# Patient Record
Sex: Female | Born: 1940
Health system: Southern US, Community
[De-identification: ages and names within clinical notes are randomized; demographics above are authoritative.]

## PROBLEM LIST (undated history)

## (undated) DIAGNOSIS — I509 Heart failure, unspecified: Secondary | ICD-10-CM

## (undated) DIAGNOSIS — I10 Essential (primary) hypertension: Secondary | ICD-10-CM

## (undated) DIAGNOSIS — N189 Chronic kidney disease, unspecified: Secondary | ICD-10-CM

## (undated) DIAGNOSIS — E119 Type 2 diabetes mellitus without complications: Secondary | ICD-10-CM

## (undated) DIAGNOSIS — I442 Atrioventricular block, complete: Secondary | ICD-10-CM

## (undated) DIAGNOSIS — E785 Hyperlipidemia, unspecified: Secondary | ICD-10-CM

## (undated) DIAGNOSIS — I2510 Atherosclerotic heart disease of native coronary artery without angina pectoris: Secondary | ICD-10-CM

## (undated) HISTORY — PX: VAGINAL HYSTERECTOMY: SUR661

## (undated) HISTORY — DX: Atrioventricular block, complete: I44.2

## (undated) HISTORY — DX: Atherosclerotic heart disease of native coronary artery without angina pectoris: I25.10

## (undated) HISTORY — PX: THYROID SURGERY: SHX805

## (undated) HISTORY — PX: HEMORRHOID SURGERY: SHX153

## (undated) HISTORY — DX: Essential (primary) hypertension: I10

## (undated) HISTORY — DX: Type 2 diabetes mellitus without complications: E11.9

## (undated) HISTORY — DX: Heart failure, unspecified: I50.9

## (undated) HISTORY — DX: Chronic kidney disease, unspecified: N18.9

## (undated) HISTORY — PX: BREAST EXCISIONAL BIOPSY: SUR124

## (undated) HISTORY — PX: MENISCUS REPAIR: SHX5179

## (undated) HISTORY — DX: Hyperlipidemia, unspecified: E78.5

---

## 1998-11-21 ENCOUNTER — Encounter: Admission: RE | Admit: 1998-11-21 | Discharge: 1998-11-21 | Payer: Self-pay | Admitting: *Deleted

## 1999-08-18 ENCOUNTER — Other Ambulatory Visit: Admission: RE | Admit: 1999-08-18 | Discharge: 1999-08-18 | Payer: Self-pay | Admitting: Obstetrics and Gynecology

## 1999-08-18 ENCOUNTER — Encounter: Admission: RE | Admit: 1999-08-18 | Discharge: 1999-08-18 | Payer: Self-pay | Admitting: Obstetrics and Gynecology

## 1999-08-18 ENCOUNTER — Encounter: Payer: Self-pay | Admitting: Obstetrics and Gynecology

## 2001-04-29 ENCOUNTER — Encounter: Payer: Self-pay | Admitting: Obstetrics and Gynecology

## 2001-04-29 ENCOUNTER — Encounter: Admission: RE | Admit: 2001-04-29 | Discharge: 2001-04-29 | Payer: Self-pay | Admitting: Obstetrics and Gynecology

## 2004-01-17 ENCOUNTER — Encounter: Admission: RE | Admit: 2004-01-17 | Discharge: 2004-01-17 | Payer: Self-pay | Admitting: Internal Medicine

## 2004-02-11 ENCOUNTER — Encounter (INDEPENDENT_AMBULATORY_CARE_PROVIDER_SITE_OTHER): Payer: Self-pay | Admitting: *Deleted

## 2004-02-11 ENCOUNTER — Ambulatory Visit (HOSPITAL_COMMUNITY): Admission: RE | Admit: 2004-02-11 | Discharge: 2004-02-11 | Payer: Self-pay | Admitting: Gastroenterology

## 2007-06-16 ENCOUNTER — Encounter: Admission: RE | Admit: 2007-06-16 | Discharge: 2007-06-16 | Payer: Self-pay | Admitting: Internal Medicine

## 2010-05-26 NOTE — Op Note (Signed)
NAMESHAKEMIA, Connie Swanson                   ACCOUNT NO.:  [id number]   MEDICAL RECORD NO.:  [id number]          PATIENT TYPE:  AMB   LOCATION:  ENDO                         FACILITY:  [facility]   PHYSICIAN:  Jeryl Columbia, M.D.    DATE OF BIRTH:  09-Apr-1940   DATE OF PROCEDURE:  02/11/2004  DATE OF DISCHARGE:                                 OPERATIVE REPORT   PROCEDURE:  Colonoscopy and polypectomy.   INDICATION:  Screening.   Consent was signed after risks, benefits, methods, and options thoroughly  discussed in the office.   MEDICINES USED:  Demerol 50, Versed 6.   PROCEDURE:  Rectal inspection is pertinent for external hemorrhoids.  Small  digital exam is negative.  Video pediatric adjustable colonoscope was  inserted and fairly easily advanced around the colon to the cecum.  This did  not require any abdominal pressure or any position changes.  Other than some  mild melanosis and some left-sided diverticula, no abnormalities were seen  on insertion.  The cecum was identified by the appendiceal orifice and  ileocecal valve.  The scope was slowly withdrawn.  The prep was adequate.  There was some liquid stool that required washing and suctioning.  On slow  withdrawal through the colon, in the ascending colon, a questionable tiny  polyp was seen and was cold biopsied x 2 and put in the first container.  Scope was slowly withdrawn.  In the transverse and descending, there were  three tiny polyps.  We went ahead and hot biopsied them and put them in a  second container.  In the sigmoid was a couple of hyperplastic-appearing  polyps and they were cold biopsied.  Left-sided occasional diverticula were  confirmed.  No other abnormalities were seen as we slowly withdrew back to  the rectum.  Anorectal pull-through and retroflexion confirmed some small  hemorrhoids.  Scope was drain, air was suctioned, and the scope removed.  The patient tolerated the procedure well.  There was no obvious  immediate  complication.   ENDOSCOPIC DIAGNOSES:  1.  Internal and external small hemorrhoids.  2.  Left-sided occasional diverticula.  3.  Mild melanosis.  4.  A few tiny to small polyps in the sigmoid, descending, transverse, and      ascending, some cold in the sigmoid and ascending, some hot in the      descending and transverse.  5.  Otherwise within normal limits in the cecum.   PLAN:  Await pathology.  Would probably recheck colon screening in five  years.  Happy to see back p.r.n.  Otherwise, return care to Dr. __________  for the customary health maintenance including yearly rectals and guaiacs.      MEM/MEDQ  D:  02/11/2004  T:  02/11/2004  Job:  [id number]   cc:   Haywood Pao, M.D.  [address]  [place]  [place] [address]  Fax: [phone]

## 2010-10-10 ENCOUNTER — Ambulatory Visit (HOSPITAL_COMMUNITY)
Admission: RE | Admit: 2010-10-10 | Discharge: 2010-10-10 | Disposition: A | Discharge status: Home / Self Care | Payer: Medicare Other | Source: Ambulatory Visit | Attending: Cardiology | Admitting: Cardiology

## 2010-10-10 DIAGNOSIS — I2510 Atherosclerotic heart disease of native coronary artery without angina pectoris: Secondary | ICD-10-CM | POA: Insufficient documentation

## 2010-10-10 DIAGNOSIS — R0609 Other forms of dyspnea: Secondary | ICD-10-CM | POA: Insufficient documentation

## 2010-10-10 DIAGNOSIS — I10 Essential (primary) hypertension: Secondary | ICD-10-CM | POA: Insufficient documentation

## 2010-10-10 DIAGNOSIS — E119 Type 2 diabetes mellitus without complications: Secondary | ICD-10-CM | POA: Insufficient documentation

## 2010-10-10 DIAGNOSIS — R0602 Shortness of breath: Secondary | ICD-10-CM | POA: Insufficient documentation

## 2010-10-10 DIAGNOSIS — R0989 Other specified symptoms and signs involving the circulatory and respiratory systems: Secondary | ICD-10-CM | POA: Insufficient documentation

## 2010-10-10 LAB — POCT I-STAT 3, VENOUS BLOOD GAS (G3P V)
Acid-Base Excess: 8 mmol/L — ABNORMAL HIGH (ref 0.0–2.0)
Bicarbonate: 33.8 mEq/L — ABNORMAL HIGH (ref 20.0–24.0)
O2 Saturation: 75 %
TCO2: 35 mmol/L (ref 0–100)
pCO2, Ven: 51 mmHg — ABNORMAL HIGH (ref 45.0–50.0)
pH, Ven: 7.429 — ABNORMAL HIGH (ref 7.250–7.300)
pO2, Ven: 41 mmHg (ref 30.0–45.0)

## 2010-10-10 LAB — GLUCOSE, CAPILLARY
Glucose-Capillary: 119 mg/dL — ABNORMAL HIGH (ref 70–99)
Glucose-Capillary: 140 mg/dL — ABNORMAL HIGH (ref 70–99)

## 2010-10-10 LAB — POCT I-STAT 3, ART BLOOD GAS (G3+)
pCO2 arterial: 50.2 mmHg — ABNORMAL HIGH (ref 35.0–45.0)
pH, Arterial: 7.407 — ABNORMAL HIGH (ref 7.350–7.400)

## 2010-10-16 NOTE — Cardiovascular Report (Signed)
Connie Swanson, Connie Swanson  MEDICAL RECORD NO.:  [id number]  LOCATION:  [facility]                         FACILITY:  [facility]  PHYSICIAN:  Laverda Page, MD DATE OF BIRTH:  1940/06/27  DATE OF PROCEDURE:  10/10/2010 DATE OF DISCHARGE:  10/10/2010                           CARDIAC CATHETERIZATION   REFERRING PHYSICIAN:  Haywood Pao, MD  PROCEDURES PERFORMED: 1. Right heart catheterization. 2. Left heart catheterization including:     a.     Left ventriculography.     b.     Selective right and left coronary arteriography.  INDICATIONS:  Ms. Connie Swanson is a 70 year old female with a history of hypertension, diabetes, who had been complaining of shortness of breath and dyspnea on exertion.  She also has abnormal EKG in the form of left bundle-branch block.  Echocardiogram had revealed ejection fraction of 35% with a possible mid-to-distal anterior hypokinesis.  The Lexiscan stress test had revealed inferior and inferoseptal wall scar without ischemia with ejection fraction of 45% considered to be a low-risk. However, because of shortness of breath felt to be due to congestive heart failure, she is now brought to the Cardiac Cath Lab to evaluate her coronary anatomy.  Right heart catheterization is being performed to evaluate for pulmonary hypertension, for dyspnea.  HEMODYNAMIC DATA:  Right heart catheterization:  RA pressure 7/7, mean 6 mmHg.  RA saturation 76%.  RV pressure 123456, end-diastolic pressure 4 mmHg.  PA pressure 27/40 with a mean of 21 mmHg.  PA saturation was 75%.  Pulmonary capillary wedge 6/5 with a mean of 3 mmHg.  Aortic saturation was 100%.  Cardiac output was 6.4 with a cardiac index of 3.34 by Fick.  Right coronary artery:  Right coronary artery is a large caliber and superdominant vessel.  Gives origin to large PL branch and 2 PDA large branches.  Smooth and normal.  Left main coronary artery:  Left main coronary  artery is a large-caliber vessel, which is smooth and normal.  Circumflex coronary artery:  Circumflex coronary artery is a very large- caliber vessel.  Gives origin to a moderate-sized obtuse marginal 1 and continues distally as a large obtuse marginal 2.  Smooth and normal.  LAD:  LAD is a large-caliber vessel in the proximal segment.  Gives origin to a moderate-sized diagonal 1 and then gives origin to a very large diagonal 2, which is bigger than the LAD itself.  Between diagonal 1 and diagonal 2, there is a eccentric 50% stenosis noted in the LAD. The diagonal 2, which is very large has a ostial 30% stenosis. Otherwise, except for this focal stenosis of 50%, there is no other lesion noted in the LAD or the large diagonal 2.  IMPRESSION: 1. Single-vessel coronary artery disease involving the mid left     anterior descending with an eccentric 50% stenosis.  Otherwise,     smooth and normal coronary arteries. 2. Left ventricular systolic function was normal.  Left     ventriculography performed both in left anterior oblique and right     anterior oblique projections revealed ejection fraction of 50% with     very  mild septal hypokinesis probably related to left bundle-branch     block.  There was no significant mitral regurgitation.  RECOMMENDATIONS:  Based on the coronary anatomy, continued aggressive risk modification is indicated.  A total of 75 mL of contrast was utilized for diagnostic angiography.  TECHNIQUE OF PROCEDURE:  Under sterile precautions, using a 5-French right antecubital vein access and a 6-French right radial access, a 5- French balloon-tipped catheter was advanced through the venous sheath into the pulmonary catheter wedge position without any complication. Right heart pressures waveforms were analyzed and catheter then pulled out of body after obtaining saturations.  Left heart catheterization was performed using a 6-French right radial access.  A  6-French multipurpose B2 catheter was advanced into ascending aorta and then into the left ventricle.  Left ventriculography was performed both in LAO and RAO projections.  Catheter pulled into the ascending aorta.  Right coronary selectively engaged and angiography was performed.  Then the left main coronary selectively engaged and angiography was performed, then catheter was then pulled out of body.  Hemostasis were obtained by applying TR band at the arterial access site and manual pressure was held at the venous site without any complications.     Laverda Page, MD     JRG/MEDQ  D:  10/10/2010  T:  10/10/2010  Job:  [id number]  cc:   Haywood Pao, M.D.  Electronically Signed by Adrian Prows MD on 10/16/2010 08:43:11 AM

## 2011-03-12 ENCOUNTER — Other Ambulatory Visit: Payer: Self-pay | Admitting: Internal Medicine

## 2011-03-12 DIAGNOSIS — Z1231 Encounter for screening mammogram for malignant neoplasm of breast: Secondary | ICD-10-CM

## 2011-03-22 ENCOUNTER — Ambulatory Visit
Admission: RE | Admit: 2011-03-22 | Discharge: 2011-03-22 | Disposition: A | Discharge status: Home / Self Care | Payer: Medicare Other | Source: Ambulatory Visit | Attending: Internal Medicine | Admitting: Internal Medicine

## 2011-03-22 DIAGNOSIS — Z1231 Encounter for screening mammogram for malignant neoplasm of breast: Secondary | ICD-10-CM

## 2013-06-04 ENCOUNTER — Other Ambulatory Visit: Payer: Self-pay

## 2013-06-04 DIAGNOSIS — Z1231 Encounter for screening mammogram for malignant neoplasm of breast: Secondary | ICD-10-CM

## 2013-06-19 ENCOUNTER — Ambulatory Visit
Admission: RE | Admit: 2013-06-19 | Discharge: 2013-06-19 | Disposition: A | Discharge status: Home / Self Care | Payer: Medicare Other | Source: Ambulatory Visit

## 2013-06-19 DIAGNOSIS — Z1231 Encounter for screening mammogram for malignant neoplasm of breast: Secondary | ICD-10-CM

## 2015-01-26 ENCOUNTER — Other Ambulatory Visit: Payer: Self-pay | Admitting: Internal Medicine

## 2015-01-26 DIAGNOSIS — Z0000 Encounter for general adult medical examination without abnormal findings: Secondary | ICD-10-CM

## 2015-01-28 ENCOUNTER — Ambulatory Visit
Admission: RE | Admit: 2015-01-28 | Discharge: 2015-01-28 | Disposition: A | Discharge status: Home / Self Care | Payer: Medicare Other | Source: Ambulatory Visit | Attending: Internal Medicine | Admitting: Internal Medicine

## 2015-01-28 DIAGNOSIS — Z0000 Encounter for general adult medical examination without abnormal findings: Secondary | ICD-10-CM

## 2015-03-10 DIAGNOSIS — E1165 Type 2 diabetes mellitus with hyperglycemia: Secondary | ICD-10-CM | POA: Diagnosis not present

## 2015-04-27 DIAGNOSIS — N3001 Acute cystitis with hematuria: Secondary | ICD-10-CM | POA: Diagnosis not present

## 2015-05-16 DIAGNOSIS — R300 Dysuria: Secondary | ICD-10-CM | POA: Diagnosis not present

## 2015-05-16 DIAGNOSIS — Z6831 Body mass index (BMI) 31.0-31.9, adult: Secondary | ICD-10-CM | POA: Diagnosis not present

## 2015-05-16 DIAGNOSIS — M109 Gout, unspecified: Secondary | ICD-10-CM | POA: Diagnosis not present

## 2015-06-09 DIAGNOSIS — Z961 Presence of intraocular lens: Secondary | ICD-10-CM | POA: Diagnosis not present

## 2015-06-09 DIAGNOSIS — H00025 Hordeolum internum left lower eyelid: Secondary | ICD-10-CM | POA: Diagnosis not present

## 2015-06-09 DIAGNOSIS — H04123 Dry eye syndrome of bilateral lacrimal glands: Secondary | ICD-10-CM | POA: Diagnosis not present

## 2015-06-23 DIAGNOSIS — H00025 Hordeolum internum left lower eyelid: Secondary | ICD-10-CM | POA: Diagnosis not present

## 2015-07-27 DIAGNOSIS — M109 Gout, unspecified: Secondary | ICD-10-CM | POA: Diagnosis not present

## 2015-07-27 DIAGNOSIS — I10 Essential (primary) hypertension: Secondary | ICD-10-CM | POA: Diagnosis not present

## 2015-07-27 DIAGNOSIS — R8299 Other abnormal findings in urine: Secondary | ICD-10-CM | POA: Diagnosis not present

## 2015-07-27 DIAGNOSIS — E038 Other specified hypothyroidism: Secondary | ICD-10-CM | POA: Diagnosis not present

## 2015-07-27 DIAGNOSIS — E1139 Type 2 diabetes mellitus with other diabetic ophthalmic complication: Secondary | ICD-10-CM | POA: Diagnosis not present

## 2015-07-27 DIAGNOSIS — M859 Disorder of bone density and structure, unspecified: Secondary | ICD-10-CM | POA: Diagnosis not present

## 2015-08-03 DIAGNOSIS — M109 Gout, unspecified: Secondary | ICD-10-CM | POA: Diagnosis not present

## 2015-08-03 DIAGNOSIS — E1151 Type 2 diabetes mellitus with diabetic peripheral angiopathy without gangrene: Secondary | ICD-10-CM | POA: Diagnosis not present

## 2015-08-03 DIAGNOSIS — Z0000 Encounter for general adult medical examination without abnormal findings: Secondary | ICD-10-CM | POA: Diagnosis not present

## 2015-08-03 DIAGNOSIS — D631 Anemia in chronic kidney disease: Secondary | ICD-10-CM | POA: Diagnosis not present

## 2015-08-03 DIAGNOSIS — N183 Chronic kidney disease, stage 3 (moderate): Secondary | ICD-10-CM | POA: Diagnosis not present

## 2015-08-03 DIAGNOSIS — I130 Hypertensive heart and chronic kidney disease with heart failure and stage 1 through stage 4 chronic kidney disease, or unspecified chronic kidney disease: Secondary | ICD-10-CM | POA: Diagnosis not present

## 2015-08-03 DIAGNOSIS — E1139 Type 2 diabetes mellitus with other diabetic ophthalmic complication: Secondary | ICD-10-CM | POA: Diagnosis not present

## 2015-08-03 DIAGNOSIS — Z6835 Body mass index (BMI) 35.0-35.9, adult: Secondary | ICD-10-CM | POA: Diagnosis not present

## 2015-08-03 DIAGNOSIS — D72829 Elevated white blood cell count, unspecified: Secondary | ICD-10-CM | POA: Diagnosis not present

## 2015-08-03 DIAGNOSIS — E668 Other obesity: Secondary | ICD-10-CM | POA: Diagnosis not present

## 2015-08-25 DIAGNOSIS — E119 Type 2 diabetes mellitus without complications: Secondary | ICD-10-CM | POA: Diagnosis not present

## 2015-08-25 DIAGNOSIS — Z961 Presence of intraocular lens: Secondary | ICD-10-CM | POA: Diagnosis not present

## 2015-08-25 DIAGNOSIS — H26493 Other secondary cataract, bilateral: Secondary | ICD-10-CM | POA: Diagnosis not present

## 2015-08-25 DIAGNOSIS — H04123 Dry eye syndrome of bilateral lacrimal glands: Secondary | ICD-10-CM | POA: Diagnosis not present

## 2015-08-25 DIAGNOSIS — H10413 Chronic giant papillary conjunctivitis, bilateral: Secondary | ICD-10-CM | POA: Diagnosis not present

## 2015-09-02 DIAGNOSIS — H26493 Other secondary cataract, bilateral: Secondary | ICD-10-CM | POA: Diagnosis not present

## 2015-10-06 DIAGNOSIS — Z23 Encounter for immunization: Secondary | ICD-10-CM | POA: Diagnosis not present

## 2016-02-01 DIAGNOSIS — M109 Gout, unspecified: Secondary | ICD-10-CM | POA: Diagnosis not present

## 2016-02-01 DIAGNOSIS — E1139 Type 2 diabetes mellitus with other diabetic ophthalmic complication: Secondary | ICD-10-CM | POA: Diagnosis not present

## 2016-02-01 DIAGNOSIS — I509 Heart failure, unspecified: Secondary | ICD-10-CM | POA: Diagnosis not present

## 2016-02-01 DIAGNOSIS — I10 Essential (primary) hypertension: Secondary | ICD-10-CM | POA: Diagnosis not present

## 2016-02-01 DIAGNOSIS — N183 Chronic kidney disease, stage 3 (moderate): Secondary | ICD-10-CM | POA: Diagnosis not present

## 2016-02-01 DIAGNOSIS — R808 Other proteinuria: Secondary | ICD-10-CM | POA: Diagnosis not present

## 2016-02-01 DIAGNOSIS — E668 Other obesity: Secondary | ICD-10-CM | POA: Diagnosis not present

## 2016-02-01 DIAGNOSIS — I130 Hypertensive heart and chronic kidney disease with heart failure and stage 1 through stage 4 chronic kidney disease, or unspecified chronic kidney disease: Secondary | ICD-10-CM | POA: Diagnosis not present

## 2016-02-01 DIAGNOSIS — D631 Anemia in chronic kidney disease: Secondary | ICD-10-CM | POA: Diagnosis not present

## 2016-02-01 DIAGNOSIS — Z794 Long term (current) use of insulin: Secondary | ICD-10-CM | POA: Diagnosis not present

## 2016-08-01 DIAGNOSIS — M109 Gout, unspecified: Secondary | ICD-10-CM | POA: Diagnosis not present

## 2016-08-01 DIAGNOSIS — N183 Chronic kidney disease, stage 3 (moderate): Secondary | ICD-10-CM | POA: Diagnosis not present

## 2016-08-01 DIAGNOSIS — E7800 Pure hypercholesterolemia, unspecified: Secondary | ICD-10-CM | POA: Diagnosis not present

## 2016-08-01 DIAGNOSIS — E038 Other specified hypothyroidism: Secondary | ICD-10-CM | POA: Diagnosis not present

## 2016-08-08 ENCOUNTER — Other Ambulatory Visit: Payer: Self-pay | Admitting: Internal Medicine

## 2016-08-08 DIAGNOSIS — E1151 Type 2 diabetes mellitus with diabetic peripheral angiopathy without gangrene: Secondary | ICD-10-CM | POA: Diagnosis not present

## 2016-08-08 DIAGNOSIS — Z1231 Encounter for screening mammogram for malignant neoplasm of breast: Secondary | ICD-10-CM

## 2016-08-08 DIAGNOSIS — E1165 Type 2 diabetes mellitus with hyperglycemia: Secondary | ICD-10-CM | POA: Diagnosis not present

## 2016-08-08 DIAGNOSIS — E1139 Type 2 diabetes mellitus with other diabetic ophthalmic complication: Secondary | ICD-10-CM | POA: Diagnosis not present

## 2016-08-08 DIAGNOSIS — Z0000 Encounter for general adult medical examination without abnormal findings: Secondary | ICD-10-CM | POA: Diagnosis not present

## 2016-08-15 ENCOUNTER — Ambulatory Visit: Payer: Medicare Other

## 2016-08-16 ENCOUNTER — Ambulatory Visit
Admission: RE | Admit: 2016-08-16 | Discharge: 2016-08-16 | Disposition: A | Discharge status: Home / Self Care | Payer: Medicare Other | Source: Ambulatory Visit | Attending: Internal Medicine | Admitting: Internal Medicine

## 2016-08-16 DIAGNOSIS — Z1231 Encounter for screening mammogram for malignant neoplasm of breast: Secondary | ICD-10-CM

## 2016-08-29 DIAGNOSIS — H40013 Open angle with borderline findings, low risk, bilateral: Secondary | ICD-10-CM | POA: Diagnosis not present

## 2016-08-29 DIAGNOSIS — E119 Type 2 diabetes mellitus without complications: Secondary | ICD-10-CM | POA: Diagnosis not present

## 2016-08-29 DIAGNOSIS — H26491 Other secondary cataract, right eye: Secondary | ICD-10-CM | POA: Diagnosis not present

## 2016-08-29 DIAGNOSIS — Z961 Presence of intraocular lens: Secondary | ICD-10-CM | POA: Diagnosis not present

## 2016-08-30 DIAGNOSIS — M859 Disorder of bone density and structure, unspecified: Secondary | ICD-10-CM | POA: Diagnosis not present

## 2016-08-30 DIAGNOSIS — N183 Chronic kidney disease, stage 3 (moderate): Secondary | ICD-10-CM | POA: Diagnosis not present

## 2016-08-30 DIAGNOSIS — I10 Essential (primary) hypertension: Secondary | ICD-10-CM | POA: Diagnosis not present

## 2016-08-30 DIAGNOSIS — E1165 Type 2 diabetes mellitus with hyperglycemia: Secondary | ICD-10-CM | POA: Diagnosis not present

## 2016-08-31 DIAGNOSIS — H26491 Other secondary cataract, right eye: Secondary | ICD-10-CM | POA: Diagnosis not present

## 2016-10-23 DIAGNOSIS — Z23 Encounter for immunization: Secondary | ICD-10-CM | POA: Diagnosis not present

## 2016-11-05 DIAGNOSIS — R0789 Other chest pain: Secondary | ICD-10-CM | POA: Diagnosis not present

## 2016-11-05 DIAGNOSIS — E1159 Type 2 diabetes mellitus with other circulatory complications: Secondary | ICD-10-CM | POA: Diagnosis not present

## 2016-11-06 DIAGNOSIS — I130 Hypertensive heart and chronic kidney disease with heart failure and stage 1 through stage 4 chronic kidney disease, or unspecified chronic kidney disease: Secondary | ICD-10-CM | POA: Diagnosis not present

## 2016-11-06 DIAGNOSIS — E7800 Pure hypercholesterolemia, unspecified: Secondary | ICD-10-CM | POA: Diagnosis not present

## 2016-11-06 DIAGNOSIS — N183 Chronic kidney disease, stage 3 (moderate): Secondary | ICD-10-CM | POA: Diagnosis not present

## 2016-11-06 DIAGNOSIS — E1165 Type 2 diabetes mellitus with hyperglycemia: Secondary | ICD-10-CM | POA: Diagnosis not present

## 2016-12-03 DIAGNOSIS — E1151 Type 2 diabetes mellitus with diabetic peripheral angiopathy without gangrene: Secondary | ICD-10-CM | POA: Diagnosis not present

## 2016-12-13 DIAGNOSIS — I10 Essential (primary) hypertension: Secondary | ICD-10-CM | POA: Diagnosis not present

## 2016-12-13 DIAGNOSIS — E1165 Type 2 diabetes mellitus with hyperglycemia: Secondary | ICD-10-CM | POA: Diagnosis not present

## 2016-12-13 DIAGNOSIS — Z794 Long term (current) use of insulin: Secondary | ICD-10-CM | POA: Diagnosis not present

## 2016-12-13 DIAGNOSIS — N183 Chronic kidney disease, stage 3 (moderate): Secondary | ICD-10-CM | POA: Diagnosis not present

## 2017-02-15 DIAGNOSIS — R808 Other proteinuria: Secondary | ICD-10-CM | POA: Diagnosis not present

## 2017-02-15 DIAGNOSIS — E1165 Type 2 diabetes mellitus with hyperglycemia: Secondary | ICD-10-CM | POA: Diagnosis not present

## 2017-02-15 DIAGNOSIS — I130 Hypertensive heart and chronic kidney disease with heart failure and stage 1 through stage 4 chronic kidney disease, or unspecified chronic kidney disease: Secondary | ICD-10-CM | POA: Diagnosis not present

## 2017-02-15 DIAGNOSIS — E1139 Type 2 diabetes mellitus with other diabetic ophthalmic complication: Secondary | ICD-10-CM | POA: Diagnosis not present

## 2017-03-28 DIAGNOSIS — I10 Essential (primary) hypertension: Secondary | ICD-10-CM | POA: Diagnosis not present

## 2017-03-28 DIAGNOSIS — E1139 Type 2 diabetes mellitus with other diabetic ophthalmic complication: Secondary | ICD-10-CM | POA: Diagnosis not present

## 2017-03-28 DIAGNOSIS — Z794 Long term (current) use of insulin: Secondary | ICD-10-CM | POA: Diagnosis not present

## 2017-03-28 DIAGNOSIS — N183 Chronic kidney disease, stage 3 (moderate): Secondary | ICD-10-CM | POA: Diagnosis not present

## 2017-05-03 DIAGNOSIS — Z6838 Body mass index (BMI) 38.0-38.9, adult: Secondary | ICD-10-CM | POA: Diagnosis not present

## 2017-05-03 DIAGNOSIS — R42 Dizziness and giddiness: Secondary | ICD-10-CM | POA: Diagnosis not present

## 2017-05-29 DIAGNOSIS — E1151 Type 2 diabetes mellitus with diabetic peripheral angiopathy without gangrene: Secondary | ICD-10-CM | POA: Diagnosis not present

## 2017-05-29 DIAGNOSIS — E038 Other specified hypothyroidism: Secondary | ICD-10-CM | POA: Diagnosis not present

## 2017-05-29 DIAGNOSIS — D631 Anemia in chronic kidney disease: Secondary | ICD-10-CM | POA: Diagnosis not present

## 2017-05-29 DIAGNOSIS — E1165 Type 2 diabetes mellitus with hyperglycemia: Secondary | ICD-10-CM | POA: Diagnosis not present

## 2017-07-09 DIAGNOSIS — I10 Essential (primary) hypertension: Secondary | ICD-10-CM | POA: Diagnosis not present

## 2017-07-09 DIAGNOSIS — E1165 Type 2 diabetes mellitus with hyperglycemia: Secondary | ICD-10-CM | POA: Diagnosis not present

## 2017-07-09 DIAGNOSIS — N183 Chronic kidney disease, stage 3 (moderate): Secondary | ICD-10-CM | POA: Diagnosis not present

## 2017-07-09 DIAGNOSIS — Z794 Long term (current) use of insulin: Secondary | ICD-10-CM | POA: Diagnosis not present

## 2017-08-06 DIAGNOSIS — E559 Vitamin D deficiency, unspecified: Secondary | ICD-10-CM | POA: Diagnosis not present

## 2017-08-06 DIAGNOSIS — N183 Chronic kidney disease, stage 3 (moderate): Secondary | ICD-10-CM | POA: Diagnosis not present

## 2017-08-06 DIAGNOSIS — E7800 Pure hypercholesterolemia, unspecified: Secondary | ICD-10-CM | POA: Diagnosis not present

## 2017-08-06 DIAGNOSIS — R82998 Other abnormal findings in urine: Secondary | ICD-10-CM | POA: Diagnosis not present

## 2017-08-06 DIAGNOSIS — M109 Gout, unspecified: Secondary | ICD-10-CM | POA: Diagnosis not present

## 2017-08-08 DIAGNOSIS — E038 Other specified hypothyroidism: Secondary | ICD-10-CM | POA: Diagnosis not present

## 2017-08-13 DIAGNOSIS — Z0000 Encounter for general adult medical examination without abnormal findings: Secondary | ICD-10-CM | POA: Diagnosis not present

## 2017-08-13 DIAGNOSIS — I10 Essential (primary) hypertension: Secondary | ICD-10-CM | POA: Diagnosis not present

## 2017-08-13 DIAGNOSIS — E1139 Type 2 diabetes mellitus with other diabetic ophthalmic complication: Secondary | ICD-10-CM | POA: Diagnosis not present

## 2017-08-13 DIAGNOSIS — E1165 Type 2 diabetes mellitus with hyperglycemia: Secondary | ICD-10-CM | POA: Diagnosis not present

## 2017-08-14 DIAGNOSIS — Z1212 Encounter for screening for malignant neoplasm of rectum: Secondary | ICD-10-CM | POA: Diagnosis not present

## 2017-08-26 DIAGNOSIS — E1165 Type 2 diabetes mellitus with hyperglycemia: Secondary | ICD-10-CM | POA: Diagnosis not present

## 2017-09-03 DIAGNOSIS — Z961 Presence of intraocular lens: Secondary | ICD-10-CM | POA: Diagnosis not present

## 2017-09-03 DIAGNOSIS — E119 Type 2 diabetes mellitus without complications: Secondary | ICD-10-CM | POA: Diagnosis not present

## 2017-09-24 DIAGNOSIS — N183 Chronic kidney disease, stage 3 (moderate): Secondary | ICD-10-CM | POA: Diagnosis not present

## 2017-09-24 DIAGNOSIS — Z794 Long term (current) use of insulin: Secondary | ICD-10-CM | POA: Diagnosis not present

## 2017-09-24 DIAGNOSIS — I10 Essential (primary) hypertension: Secondary | ICD-10-CM | POA: Diagnosis not present

## 2017-09-24 DIAGNOSIS — E1165 Type 2 diabetes mellitus with hyperglycemia: Secondary | ICD-10-CM | POA: Diagnosis not present

## 2017-10-14 DIAGNOSIS — Z23 Encounter for immunization: Secondary | ICD-10-CM | POA: Diagnosis not present

## 2017-12-03 ENCOUNTER — Other Ambulatory Visit: Payer: Self-pay | Admitting: Internal Medicine

## 2017-12-03 DIAGNOSIS — E1165 Type 2 diabetes mellitus with hyperglycemia: Secondary | ICD-10-CM | POA: Diagnosis not present

## 2017-12-03 DIAGNOSIS — I10 Essential (primary) hypertension: Secondary | ICD-10-CM | POA: Diagnosis not present

## 2017-12-03 DIAGNOSIS — E1139 Type 2 diabetes mellitus with other diabetic ophthalmic complication: Secondary | ICD-10-CM | POA: Diagnosis not present

## 2017-12-03 DIAGNOSIS — Z1231 Encounter for screening mammogram for malignant neoplasm of breast: Secondary | ICD-10-CM

## 2017-12-03 DIAGNOSIS — E559 Vitamin D deficiency, unspecified: Secondary | ICD-10-CM | POA: Diagnosis not present

## 2017-12-27 ENCOUNTER — Encounter: Payer: Self-pay | Admitting: Podiatry

## 2017-12-27 ENCOUNTER — Ambulatory Visit (INDEPENDENT_AMBULATORY_CARE_PROVIDER_SITE_OTHER): Payer: Medicare Other | Admitting: Podiatry

## 2017-12-27 DIAGNOSIS — L03032 Cellulitis of left toe: Secondary | ICD-10-CM

## 2017-12-27 DIAGNOSIS — L02612 Cutaneous abscess of left foot: Secondary | ICD-10-CM

## 2017-12-27 MED ORDER — CEPHALEXIN 500 MG PO CAPS: 500.0000 mg | ORAL_CAPSULE | Freq: Two times a day (BID) | ORAL | 0 refills | Status: DC

## 2017-12-27 NOTE — Progress Notes (Signed)
This patient presents to the office with chief complaint of pain to the office with chief complaint of a painful ingrown toenail.  She says that she was trimming her nails herself and ended up nicking herself.  The toe is now red swollen and inflamed along the outside bordr left big toe.  She has pain walking and wearing her shoes.  She presents the office today for an evaluation and treatment of her painful ingrowing toenail.  Vascular  Dorsalis pedis and posterior tibial pulses are palpable  B/L.  Capillary return  WNL.  Temperature gradient is  WNL.  Skin turgor  WNL  Sensorium  Senn Weinstein monofilament wire  WNL. Normal tactile sensation.  Nail Exam  Patient has normal nails with no evidence of bacterial or fungal infection.  Redness swelling with granulation tissue noted on the lateral border of the left great toe.  Orthopedic  Exam  Muscle tone and muscle strength  WNL.  No limitations of motion feet  B/L.  No crepitus or joint effusion noted.  Foot type is unremarkable and digits show no abnormalities.  Bony prominences are unremarkable.  Skin  No open lesions.  Normal skin texture and turgor.  Paronychia lateral border left great toe.    IE  Incision and Drainage lateral border left great toe.  Home instructions given.  Prescribe cephalexin  # 15.  RTC 10 days.   Gardiner Barefoot DPM

## 2017-12-27 NOTE — Patient Instructions (Signed)

## 2018-01-03 ENCOUNTER — Ambulatory Visit: Payer: Medicare Other | Admitting: Podiatry

## 2018-01-03 ENCOUNTER — Encounter: Payer: Self-pay | Admitting: Podiatry

## 2018-01-03 DIAGNOSIS — L03032 Cellulitis of left toe: Secondary | ICD-10-CM

## 2018-01-03 DIAGNOSIS — L02612 Cutaneous abscess of left foot: Secondary | ICD-10-CM

## 2018-01-03 DIAGNOSIS — Z09 Encounter for follow-up examination after completed treatment for conditions other than malignant neoplasm: Secondary | ICD-10-CM

## 2018-01-03 NOTE — Progress Notes (Signed)
This patient returns to the office following nail surgery 2  week ago.  The patient says toe has been soaked and bandaged as directed.  There has been improvement of the toe since the surgery has been performed. The patient presents for continued evaluation and treatment.  GENERAL APPEARANCE: Alert, conversant. Appropriately groomed. No acute distress.  VASCULAR: Pedal pulses palpable at  [facility] and PT bilateral.  Capillary refill time is immediate to all digits,  Normal temperature gradient.    NEUROLOGIC: sensation is normal to 5.07 monofilament at 5/5 sites bilateral.  Light touch is intact bilateral, Muscle strength normal.  MUSCULOSKELETAL: acceptable muscle strength, tone and stability bilateral.  Intrinsic muscluature intact bilateral.  Rectus appearance of foot and digits noted bilateral.   DERMATOLOGIC: skin color, texture, and turgor are within normal limits.  No preulcerative lesions or ulcers  are seen, no interdigital maceration noted.   NAILS  There is necrotic tissue along the nail groove  In the absence of redness swelling and pain.  DX  S/p nail surgery  ROV  Home instructions were discussed.  Patient to call the office if there are any questions or concerns. To perform cauterization as needed in future.   Gardiner Barefoot DPM

## 2018-02-10 DIAGNOSIS — Z0120 Encounter for dental examination and cleaning without abnormal findings: Secondary | ICD-10-CM | POA: Diagnosis not present

## 2018-02-25 DIAGNOSIS — Z0120 Encounter for dental examination and cleaning without abnormal findings: Secondary | ICD-10-CM | POA: Diagnosis not present

## 2018-03-05 DIAGNOSIS — E1165 Type 2 diabetes mellitus with hyperglycemia: Secondary | ICD-10-CM | POA: Diagnosis not present

## 2018-03-05 DIAGNOSIS — E1139 Type 2 diabetes mellitus with other diabetic ophthalmic complication: Secondary | ICD-10-CM | POA: Diagnosis not present

## 2018-03-05 DIAGNOSIS — Z794 Long term (current) use of insulin: Secondary | ICD-10-CM | POA: Diagnosis not present

## 2018-03-05 DIAGNOSIS — I130 Hypertensive heart and chronic kidney disease with heart failure and stage 1 through stage 4 chronic kidney disease, or unspecified chronic kidney disease: Secondary | ICD-10-CM | POA: Diagnosis not present

## 2018-03-11 DIAGNOSIS — R220 Localized swelling, mass and lump, head: Secondary | ICD-10-CM | POA: Diagnosis not present

## 2018-03-11 DIAGNOSIS — K118 Other diseases of salivary glands: Secondary | ICD-10-CM | POA: Diagnosis not present

## 2018-04-14 IMAGING — MG 2D DIGITAL SCREENING BILATERAL MAMMOGRAM WITH CAD AND ADJUNCT TO
8 of 13 series · 8 of 29 positions shown · non-contrast
Comparison: Previous exam(s).

CLINICAL DATA: Screening.

EXAM:
2D DIGITAL SCREENING BILATERAL MAMMOGRAM WITH CAD AND ADJUNCT TOMO

[L MLO (1 of 2)]
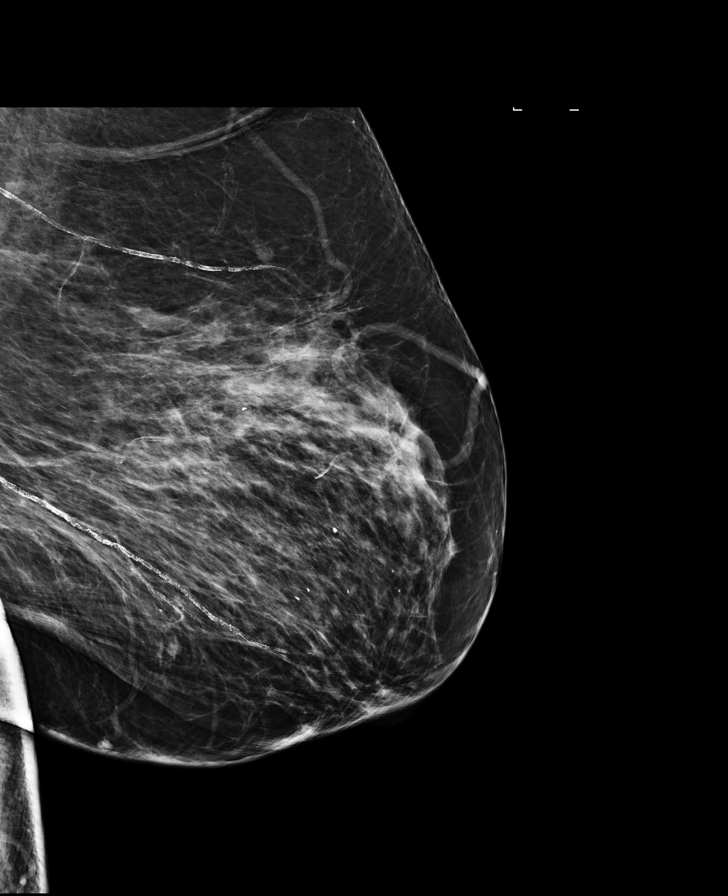

[L CC]
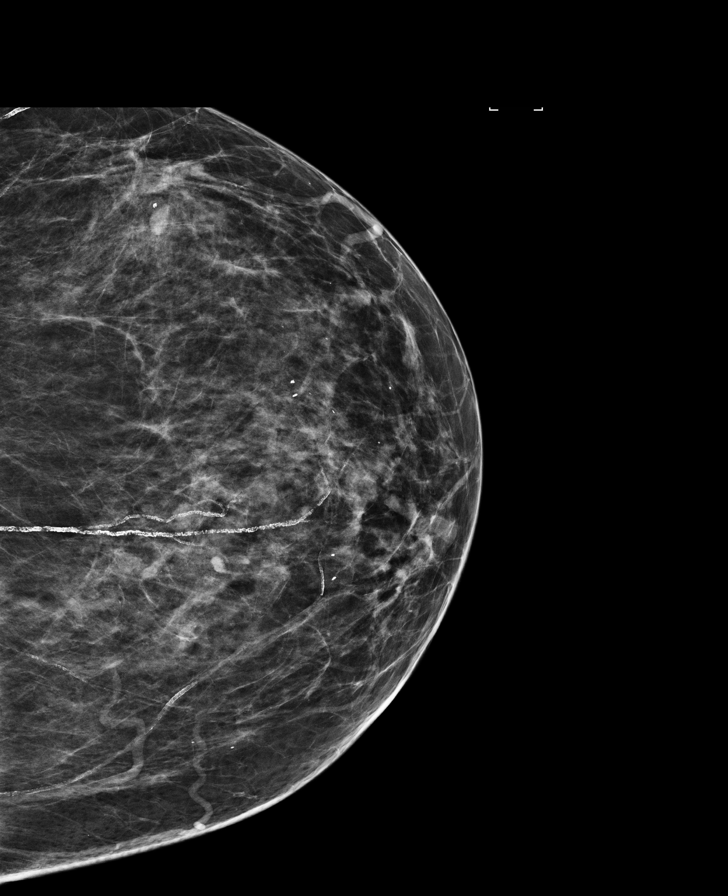

[R MLO]
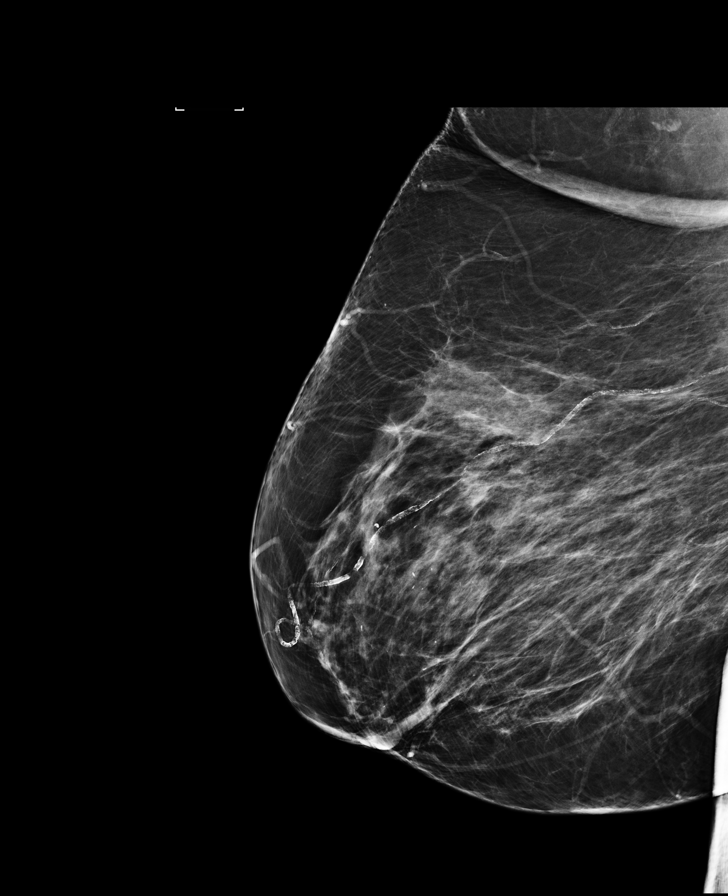

[L MLO synth-2D]
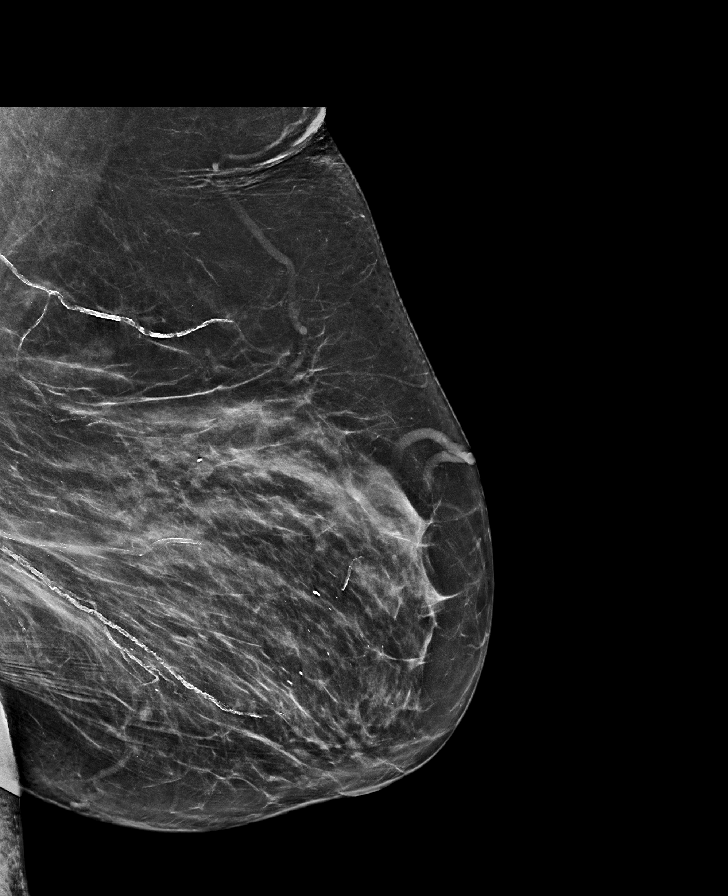

[R MLO synth-2D]
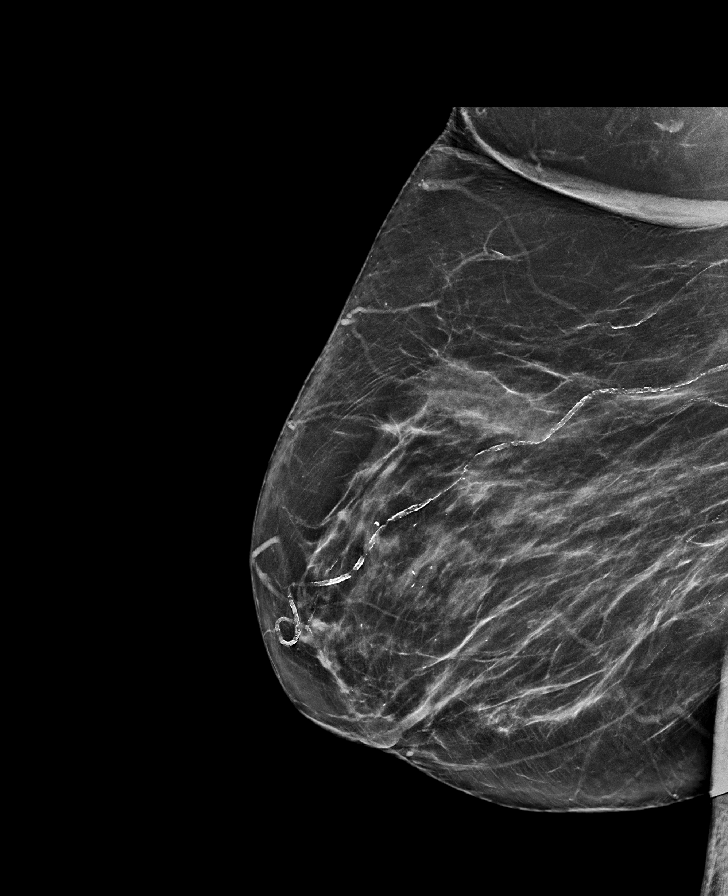

[L CC synth-2D]
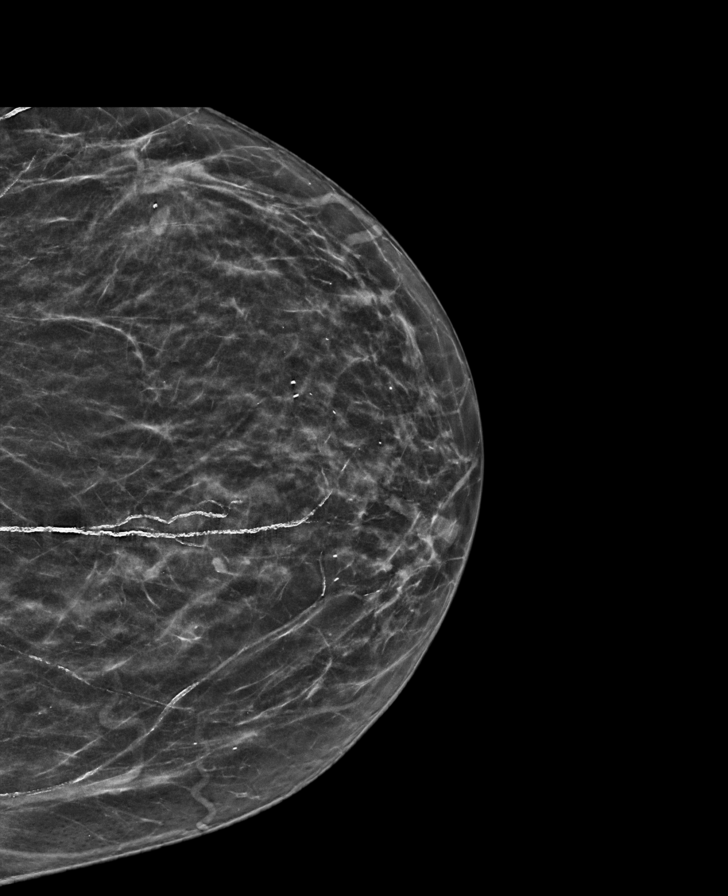

[L MLO (2 of 2)]
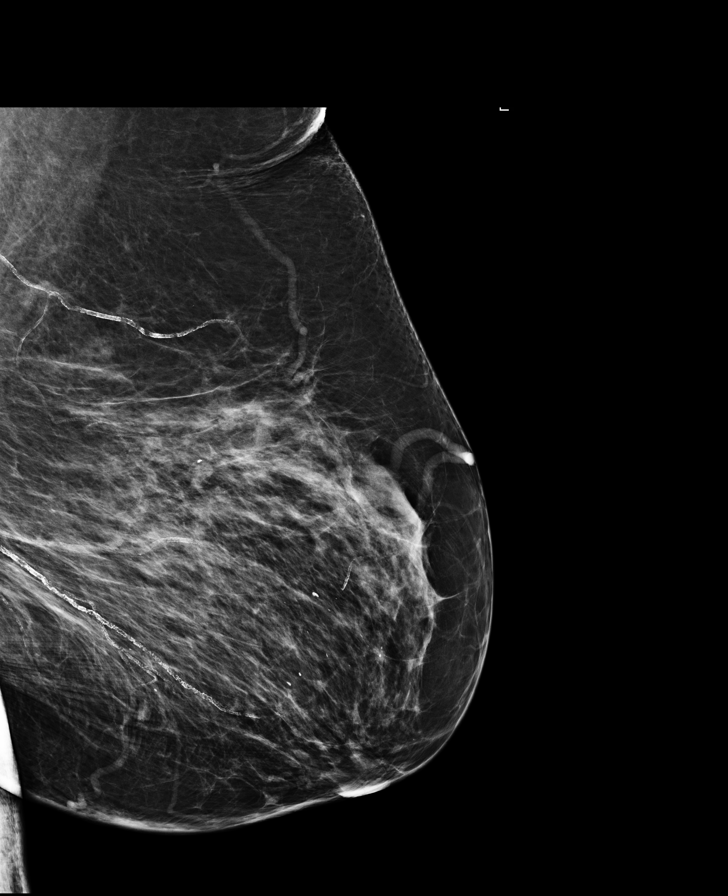

[R CC synth-2D]
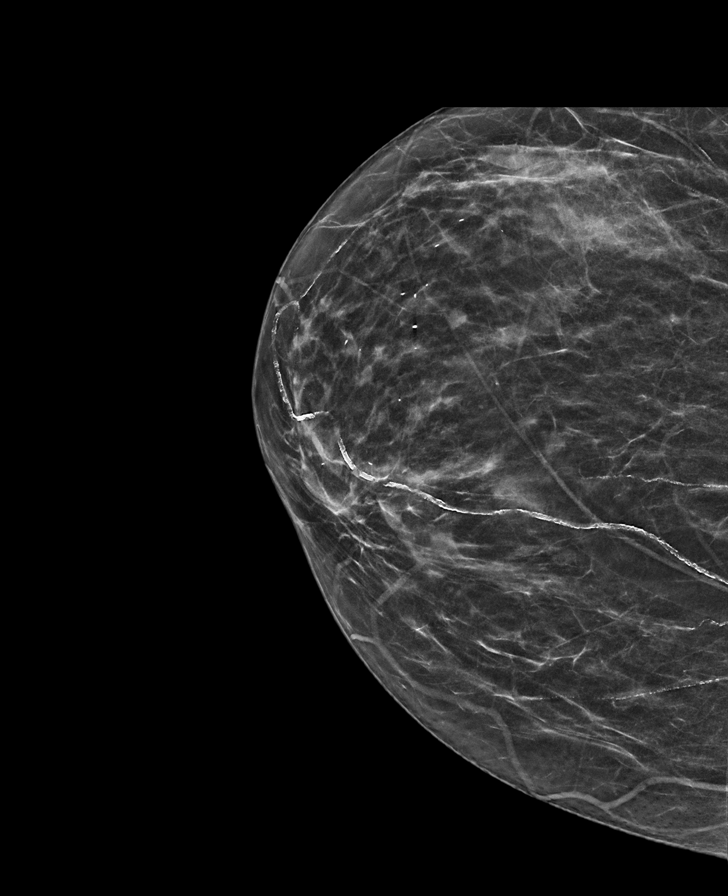

[8 of 29 positions shown; findings below may reference images not displayed]

ACR Breast Density Category c: The breast tissue is heterogeneously
dense, which may obscure small masses.
FINDINGS: There are no findings suspicious for malignancy. Images were
processed with CAD.
IMPRESSION: No mammographic evidence of malignancy. A result letter of this
screening mammogram will be mailed directly to the patient.

RECOMMENDATION:
Screening mammogram in one year. (Code:[id number]-[id number])

BI-RADS CATEGORY  1: Negative.

## 2018-06-04 DIAGNOSIS — Z794 Long term (current) use of insulin: Secondary | ICD-10-CM | POA: Diagnosis not present

## 2018-06-04 DIAGNOSIS — E559 Vitamin D deficiency, unspecified: Secondary | ICD-10-CM | POA: Diagnosis not present

## 2018-06-04 DIAGNOSIS — E1151 Type 2 diabetes mellitus with diabetic peripheral angiopathy without gangrene: Secondary | ICD-10-CM | POA: Diagnosis not present

## 2018-06-04 DIAGNOSIS — E1165 Type 2 diabetes mellitus with hyperglycemia: Secondary | ICD-10-CM | POA: Diagnosis not present

## 2018-06-04 DIAGNOSIS — I130 Hypertensive heart and chronic kidney disease with heart failure and stage 1 through stage 4 chronic kidney disease, or unspecified chronic kidney disease: Secondary | ICD-10-CM | POA: Diagnosis not present

## 2018-06-04 DIAGNOSIS — N183 Chronic kidney disease, stage 3 (moderate): Secondary | ICD-10-CM | POA: Diagnosis not present

## 2018-06-26 DIAGNOSIS — I130 Hypertensive heart and chronic kidney disease with heart failure and stage 1 through stage 4 chronic kidney disease, or unspecified chronic kidney disease: Secondary | ICD-10-CM | POA: Diagnosis not present

## 2018-06-26 DIAGNOSIS — E1165 Type 2 diabetes mellitus with hyperglycemia: Secondary | ICD-10-CM | POA: Diagnosis not present

## 2018-06-26 DIAGNOSIS — Z794 Long term (current) use of insulin: Secondary | ICD-10-CM | POA: Diagnosis not present

## 2018-06-26 DIAGNOSIS — N183 Chronic kidney disease, stage 3 (moderate): Secondary | ICD-10-CM | POA: Diagnosis not present

## 2018-08-07 DIAGNOSIS — N183 Chronic kidney disease, stage 3 (moderate): Secondary | ICD-10-CM | POA: Diagnosis not present

## 2018-08-07 DIAGNOSIS — I130 Hypertensive heart and chronic kidney disease with heart failure and stage 1 through stage 4 chronic kidney disease, or unspecified chronic kidney disease: Secondary | ICD-10-CM | POA: Diagnosis not present

## 2018-08-07 DIAGNOSIS — Z794 Long term (current) use of insulin: Secondary | ICD-10-CM | POA: Diagnosis not present

## 2018-08-07 DIAGNOSIS — E1165 Type 2 diabetes mellitus with hyperglycemia: Secondary | ICD-10-CM | POA: Diagnosis not present

## 2018-09-02 DIAGNOSIS — M8589 Other specified disorders of bone density and structure, multiple sites: Secondary | ICD-10-CM | POA: Diagnosis not present

## 2018-09-02 DIAGNOSIS — E7800 Pure hypercholesterolemia, unspecified: Secondary | ICD-10-CM | POA: Diagnosis not present

## 2018-09-02 DIAGNOSIS — E559 Vitamin D deficiency, unspecified: Secondary | ICD-10-CM | POA: Diagnosis not present

## 2018-09-02 DIAGNOSIS — E1139 Type 2 diabetes mellitus with other diabetic ophthalmic complication: Secondary | ICD-10-CM | POA: Diagnosis not present

## 2018-09-04 DIAGNOSIS — E119 Type 2 diabetes mellitus without complications: Secondary | ICD-10-CM | POA: Diagnosis not present

## 2018-09-04 DIAGNOSIS — Z961 Presence of intraocular lens: Secondary | ICD-10-CM | POA: Diagnosis not present

## 2018-09-04 DIAGNOSIS — H0102B Squamous blepharitis left eye, upper and lower eyelids: Secondary | ICD-10-CM | POA: Diagnosis not present

## 2018-09-04 DIAGNOSIS — H0102A Squamous blepharitis right eye, upper and lower eyelids: Secondary | ICD-10-CM | POA: Diagnosis not present

## 2018-09-09 DIAGNOSIS — Z0120 Encounter for dental examination and cleaning without abnormal findings: Secondary | ICD-10-CM | POA: Diagnosis not present

## 2018-09-09 DIAGNOSIS — E1139 Type 2 diabetes mellitus with other diabetic ophthalmic complication: Secondary | ICD-10-CM | POA: Diagnosis not present

## 2018-09-09 DIAGNOSIS — E1165 Type 2 diabetes mellitus with hyperglycemia: Secondary | ICD-10-CM | POA: Diagnosis not present

## 2018-09-09 DIAGNOSIS — E1151 Type 2 diabetes mellitus with diabetic peripheral angiopathy without gangrene: Secondary | ICD-10-CM | POA: Diagnosis not present

## 2018-09-09 DIAGNOSIS — Z0000 Encounter for general adult medical examination without abnormal findings: Secondary | ICD-10-CM | POA: Diagnosis not present

## 2018-09-12 ENCOUNTER — Other Ambulatory Visit: Payer: Self-pay | Admitting: Internal Medicine

## 2018-09-12 DIAGNOSIS — Z1231 Encounter for screening mammogram for malignant neoplasm of breast: Secondary | ICD-10-CM

## 2018-10-02 ENCOUNTER — Ambulatory Visit
Admission: RE | Admit: 2018-10-02 | Discharge: 2018-10-02 | Disposition: A | Discharge status: Home / Self Care | Payer: Medicare Other | Source: Ambulatory Visit | Attending: Internal Medicine | Admitting: Internal Medicine

## 2018-10-02 ENCOUNTER — Other Ambulatory Visit: Payer: Self-pay

## 2018-10-02 DIAGNOSIS — Z1231 Encounter for screening mammogram for malignant neoplasm of breast: Secondary | ICD-10-CM

## 2018-10-28 DIAGNOSIS — Z0120 Encounter for dental examination and cleaning without abnormal findings: Secondary | ICD-10-CM | POA: Diagnosis not present

## 2018-10-29 ENCOUNTER — Ambulatory Visit: Payer: Medicare Other

## 2019-01-30 ENCOUNTER — Other Ambulatory Visit: Payer: Self-pay | Admitting: Internal Medicine

## 2019-01-30 DIAGNOSIS — Z1231 Encounter for screening mammogram for malignant neoplasm of breast: Secondary | ICD-10-CM

## 2019-02-02 ENCOUNTER — Ambulatory Visit: Payer: Medicare Other | Attending: Internal Medicine

## 2019-02-02 DIAGNOSIS — Z23 Encounter for immunization: Secondary | ICD-10-CM

## 2019-02-02 NOTE — Progress Notes (Signed)
   Covid-19 Vaccination Clinic  Name:  Connie Swanson    MRN: [id number] DOB: 10/23/1940  02/02/2019  Connie Swanson was observed post Covid-19 immunization for 15 minutes without incidence. She was provided with Vaccine Information Sheet and instruction to access the V-Safe system.   Connie Swanson was instructed to call 911 with any severe reactions post vaccine: Marland Kitchen Difficulty breathing  . Swelling of your face and throat  . A fast heartbeat  . A bad rash all over your body  . Dizziness and weakness    Immunizations Administered    Name Date Dose VIS Date Route   Pfizer COVID-19 Vaccine 02/02/2019  5:34 PM 0.3 mL 12/19/2018 Intramuscular   Manufacturer: [place]   Lot: [id number]   [place]: [id number]

## 2019-02-03 DIAGNOSIS — Z0120 Encounter for dental examination and cleaning without abnormal findings: Secondary | ICD-10-CM | POA: Diagnosis not present

## 2019-02-05 ENCOUNTER — Ambulatory Visit: Payer: Medicare Other

## 2019-02-10 DIAGNOSIS — I129 Hypertensive chronic kidney disease with stage 1 through stage 4 chronic kidney disease, or unspecified chronic kidney disease: Secondary | ICD-10-CM | POA: Diagnosis not present

## 2019-02-10 DIAGNOSIS — N184 Chronic kidney disease, stage 4 (severe): Secondary | ICD-10-CM | POA: Diagnosis not present

## 2019-02-10 DIAGNOSIS — N189 Chronic kidney disease, unspecified: Secondary | ICD-10-CM | POA: Diagnosis not present

## 2019-02-10 DIAGNOSIS — N2581 Secondary hyperparathyroidism of renal origin: Secondary | ICD-10-CM | POA: Diagnosis not present

## 2019-02-10 DIAGNOSIS — D631 Anemia in chronic kidney disease: Secondary | ICD-10-CM | POA: Diagnosis not present

## 2019-02-11 ENCOUNTER — Other Ambulatory Visit: Payer: Self-pay | Admitting: Internal Medicine

## 2019-02-11 DIAGNOSIS — N184 Chronic kidney disease, stage 4 (severe): Secondary | ICD-10-CM

## 2019-02-22 ENCOUNTER — Ambulatory Visit: Payer: Medicare Other

## 2019-02-23 ENCOUNTER — Ambulatory Visit: Payer: Medicare Other | Attending: Internal Medicine

## 2019-02-23 DIAGNOSIS — Z23 Encounter for immunization: Secondary | ICD-10-CM | POA: Insufficient documentation

## 2019-02-23 NOTE — Progress Notes (Signed)
   Covid-19 Vaccination Clinic  Name:  Connie Swanson    MRN: [id number] DOB: 03-Aug-1940  02/23/2019  Connie Swanson was observed post Covid-19 immunization for 15 minutes without incidence. She was provided with Vaccine Information Sheet and instruction to access the V-Safe system.   Connie Swanson was instructed to call 911 with any severe reactions post vaccine: Marland Kitchen Difficulty breathing  . Swelling of your face and throat  . A fast heartbeat  . A bad rash all over your body  . Dizziness and weakness    Immunizations Administered    Name Date Dose VIS Date Route   Pfizer COVID-19 Vaccine 02/23/2019  1:04 PM 0.3 mL 12/19/2018 Intramuscular   Manufacturer: [place]   Lot: [id number]   [place]: [id number]

## 2019-02-24 ENCOUNTER — Other Ambulatory Visit: Payer: Medicare Other

## 2019-02-25 ENCOUNTER — Ambulatory Visit
Admission: RE | Admit: 2019-02-25 | Discharge: 2019-02-25 | Disposition: A | Discharge status: Home / Self Care | Payer: Medicare Other | Source: Ambulatory Visit | Attending: Internal Medicine | Admitting: Internal Medicine

## 2019-02-25 DIAGNOSIS — N184 Chronic kidney disease, stage 4 (severe): Secondary | ICD-10-CM

## 2019-03-10 DIAGNOSIS — E1129 Type 2 diabetes mellitus with other diabetic kidney complication: Secondary | ICD-10-CM | POA: Diagnosis not present

## 2019-03-10 DIAGNOSIS — E1139 Type 2 diabetes mellitus with other diabetic ophthalmic complication: Secondary | ICD-10-CM | POA: Diagnosis not present

## 2019-03-10 DIAGNOSIS — E1151 Type 2 diabetes mellitus with diabetic peripheral angiopathy without gangrene: Secondary | ICD-10-CM | POA: Diagnosis not present

## 2019-03-10 DIAGNOSIS — E1165 Type 2 diabetes mellitus with hyperglycemia: Secondary | ICD-10-CM | POA: Diagnosis not present

## 2019-03-10 DIAGNOSIS — Z1331 Encounter for screening for depression: Secondary | ICD-10-CM | POA: Diagnosis not present

## 2019-03-12 DIAGNOSIS — E7800 Pure hypercholesterolemia, unspecified: Secondary | ICD-10-CM | POA: Diagnosis not present

## 2019-03-12 DIAGNOSIS — E559 Vitamin D deficiency, unspecified: Secondary | ICD-10-CM | POA: Diagnosis not present

## 2019-03-12 DIAGNOSIS — E1129 Type 2 diabetes mellitus with other diabetic kidney complication: Secondary | ICD-10-CM | POA: Diagnosis not present

## 2019-03-12 DIAGNOSIS — E038 Other specified hypothyroidism: Secondary | ICD-10-CM | POA: Diagnosis not present

## 2019-04-19 DIAGNOSIS — E1151 Type 2 diabetes mellitus with diabetic peripheral angiopathy without gangrene: Secondary | ICD-10-CM | POA: Diagnosis not present

## 2019-05-04 DIAGNOSIS — Z0120 Encounter for dental examination and cleaning without abnormal findings: Secondary | ICD-10-CM | POA: Diagnosis not present

## 2019-05-12 DIAGNOSIS — N184 Chronic kidney disease, stage 4 (severe): Secondary | ICD-10-CM | POA: Diagnosis not present

## 2019-05-18 DIAGNOSIS — I129 Hypertensive chronic kidney disease with stage 1 through stage 4 chronic kidney disease, or unspecified chronic kidney disease: Secondary | ICD-10-CM | POA: Diagnosis not present

## 2019-05-18 DIAGNOSIS — D631 Anemia in chronic kidney disease: Secondary | ICD-10-CM | POA: Diagnosis not present

## 2019-05-18 DIAGNOSIS — N184 Chronic kidney disease, stage 4 (severe): Secondary | ICD-10-CM | POA: Diagnosis not present

## 2019-05-18 DIAGNOSIS — N2581 Secondary hyperparathyroidism of renal origin: Secondary | ICD-10-CM | POA: Diagnosis not present

## 2019-09-07 DIAGNOSIS — E039 Hypothyroidism, unspecified: Secondary | ICD-10-CM | POA: Diagnosis not present

## 2019-09-07 DIAGNOSIS — E7800 Pure hypercholesterolemia, unspecified: Secondary | ICD-10-CM | POA: Diagnosis not present

## 2019-09-07 DIAGNOSIS — M109 Gout, unspecified: Secondary | ICD-10-CM | POA: Diagnosis not present

## 2019-09-07 DIAGNOSIS — E1129 Type 2 diabetes mellitus with other diabetic kidney complication: Secondary | ICD-10-CM | POA: Diagnosis not present

## 2019-09-08 DIAGNOSIS — Z961 Presence of intraocular lens: Secondary | ICD-10-CM | POA: Diagnosis not present

## 2019-09-08 DIAGNOSIS — H0102A Squamous blepharitis right eye, upper and lower eyelids: Secondary | ICD-10-CM | POA: Diagnosis not present

## 2019-09-08 DIAGNOSIS — H0102B Squamous blepharitis left eye, upper and lower eyelids: Secondary | ICD-10-CM | POA: Diagnosis not present

## 2019-09-08 DIAGNOSIS — E119 Type 2 diabetes mellitus without complications: Secondary | ICD-10-CM | POA: Diagnosis not present

## 2019-09-10 DIAGNOSIS — N184 Chronic kidney disease, stage 4 (severe): Secondary | ICD-10-CM | POA: Diagnosis not present

## 2019-09-11 ENCOUNTER — Other Ambulatory Visit: Payer: Self-pay | Admitting: Internal Medicine

## 2019-09-11 DIAGNOSIS — E1129 Type 2 diabetes mellitus with other diabetic kidney complication: Secondary | ICD-10-CM | POA: Diagnosis not present

## 2019-09-11 DIAGNOSIS — E1139 Type 2 diabetes mellitus with other diabetic ophthalmic complication: Secondary | ICD-10-CM | POA: Diagnosis not present

## 2019-09-11 DIAGNOSIS — Z1231 Encounter for screening mammogram for malignant neoplasm of breast: Secondary | ICD-10-CM

## 2019-09-11 DIAGNOSIS — R82998 Other abnormal findings in urine: Secondary | ICD-10-CM | POA: Diagnosis not present

## 2019-09-11 DIAGNOSIS — E1151 Type 2 diabetes mellitus with diabetic peripheral angiopathy without gangrene: Secondary | ICD-10-CM | POA: Diagnosis not present

## 2019-09-11 DIAGNOSIS — Z0000 Encounter for general adult medical examination without abnormal findings: Secondary | ICD-10-CM | POA: Diagnosis not present

## 2019-09-18 DIAGNOSIS — N189 Chronic kidney disease, unspecified: Secondary | ICD-10-CM | POA: Diagnosis not present

## 2019-09-18 DIAGNOSIS — D631 Anemia in chronic kidney disease: Secondary | ICD-10-CM | POA: Diagnosis not present

## 2019-09-18 DIAGNOSIS — I129 Hypertensive chronic kidney disease with stage 1 through stage 4 chronic kidney disease, or unspecified chronic kidney disease: Secondary | ICD-10-CM | POA: Diagnosis not present

## 2019-09-18 DIAGNOSIS — N2581 Secondary hyperparathyroidism of renal origin: Secondary | ICD-10-CM | POA: Diagnosis not present

## 2019-09-18 DIAGNOSIS — N184 Chronic kidney disease, stage 4 (severe): Secondary | ICD-10-CM | POA: Diagnosis not present

## 2019-10-02 DIAGNOSIS — Z1212 Encounter for screening for malignant neoplasm of rectum: Secondary | ICD-10-CM | POA: Diagnosis not present

## 2019-10-03 ENCOUNTER — Other Ambulatory Visit (HOSPITAL_COMMUNITY): Payer: Self-pay | Admitting: *Deleted

## 2019-10-03 NOTE — Discharge Instructions (Signed)

## 2019-10-06 ENCOUNTER — Other Ambulatory Visit: Payer: Self-pay

## 2019-10-06 ENCOUNTER — Encounter: Payer: Self-pay | Admitting: Podiatry

## 2019-10-06 ENCOUNTER — Ambulatory Visit: Payer: Medicare Other | Admitting: Podiatry

## 2019-10-06 ENCOUNTER — Ambulatory Visit (HOSPITAL_COMMUNITY)
Admission: RE | Admit: 2019-10-06 | Discharge: 2019-10-06 | Disposition: A | Discharge status: Home / Self Care | Payer: Medicare Other | Source: Ambulatory Visit | Attending: Nephrology | Admitting: Nephrology

## 2019-10-06 DIAGNOSIS — B351 Tinea unguium: Secondary | ICD-10-CM

## 2019-10-06 DIAGNOSIS — M79674 Pain in right toe(s): Secondary | ICD-10-CM

## 2019-10-06 DIAGNOSIS — D631 Anemia in chronic kidney disease: Secondary | ICD-10-CM | POA: Diagnosis not present

## 2019-10-06 DIAGNOSIS — N184 Chronic kidney disease, stage 4 (severe): Secondary | ICD-10-CM | POA: Insufficient documentation

## 2019-10-06 DIAGNOSIS — M79675 Pain in left toe(s): Secondary | ICD-10-CM | POA: Diagnosis not present

## 2019-10-06 MED ORDER — SODIUM CHLORIDE 0.9 % IV SOLN
510.0000 mg | Freq: Once | INTRAVENOUS | Status: AC
  Administered 2019-10-06: 510 mg via INTRAVENOUS
  Filled 2019-10-06: qty 17

## 2019-10-06 NOTE — Progress Notes (Signed)
This patient returns to my office for at risk foot care.  This patient requires this care by a professional since this patient will be at risk due to having diabetes and chronic kidney disease (newly diagnosed according to patient.)  This patient is unable to cut nails herself since the patient cannot reach her nails.These nails are painful walking and wearing shoes.  This patient presents for at risk foot care today.  General Appearance  Alert, conversant and in no acute stress.  Vascular  Dorsalis pedis and posterior tibial  pulses are palpable  bilaterally.  Capillary return is within normal limits  bilaterally. Temperature is within normal limits  bilaterally.  Neurologic  Senn-Weinstein monofilament wire test within normal limits  bilaterally. Muscle power within normal limits bilaterally.  Nails Thick disfigured discolored nails with subungual debris  Hallux nails  bilaterally. No evidence of bacterial infection or drainage bilaterally.  Orthopedic  No limitations of motion  feet .  No crepitus or effusions noted.  No bony pathology or digital deformities noted.  Skin  normotropic skin with no porokeratosis noted bilaterally.  No signs of infections or ulcers noted.     Onychomycosis  Pain in right toes  Pain in left toes  Consent was obtained for treatment procedures.   Mechanical debridement of nails 1-5  bilaterally performed with a nail nipper.  Filed with dremel without incident.    Return office visit  3 months                   Told patient to return for periodic foot care and evaluation due to potential at risk complications.   Gardiner Barefoot DPM

## 2019-10-13 ENCOUNTER — Other Ambulatory Visit: Payer: Self-pay

## 2019-10-13 ENCOUNTER — Ambulatory Visit
Admission: RE | Admit: 2019-10-13 | Discharge: 2019-10-13 | Disposition: A | Discharge status: Home / Self Care | Payer: Medicare Other | Source: Ambulatory Visit | Attending: Internal Medicine | Admitting: Internal Medicine

## 2019-10-13 DIAGNOSIS — Z1231 Encounter for screening mammogram for malignant neoplasm of breast: Secondary | ICD-10-CM | POA: Diagnosis not present

## 2019-11-04 DIAGNOSIS — Z23 Encounter for immunization: Secondary | ICD-10-CM | POA: Diagnosis not present

## 2020-01-06 ENCOUNTER — Encounter: Payer: Self-pay | Admitting: Podiatry

## 2020-01-06 ENCOUNTER — Other Ambulatory Visit: Payer: Self-pay

## 2020-01-06 ENCOUNTER — Ambulatory Visit: Payer: Medicare Other | Admitting: Podiatry

## 2020-01-06 DIAGNOSIS — M79675 Pain in left toe(s): Secondary | ICD-10-CM | POA: Diagnosis not present

## 2020-01-06 DIAGNOSIS — B351 Tinea unguium: Secondary | ICD-10-CM

## 2020-01-06 DIAGNOSIS — M79674 Pain in right toe(s): Secondary | ICD-10-CM | POA: Diagnosis not present

## 2020-01-06 NOTE — Progress Notes (Signed)
This patient returns to my office for at risk foot care.  This patient requires this care by a professional since this patient will be at risk due to having diabetes and chronic kidney disease   This patient is unable to cut nails herself since the patient cannot reach her nails.These nails are painful walking and wearing shoes.  This patient presents for at risk foot care today.  General Appearance  Alert, conversant and in no acute stress.  Vascular  Dorsalis pedis and posterior tibial  pulses are palpable  bilaterally.  Capillary return is within normal limits  bilaterally. Temperature is within normal limits  bilaterally.  Neurologic  Senn-Weinstein monofilament wire test within normal limits  bilaterally. Muscle power within normal limits bilaterally.  Nails Thick disfigured discolored nails with subungual debris  Hallux nails  bilaterally. No evidence of bacterial infection or drainage bilaterally.  Orthopedic  No limitations of motion  feet .  No crepitus or effusions noted.  No bony pathology or digital deformities noted.  Skin  normotropic skin with no porokeratosis noted bilaterally.  No signs of infections or ulcers noted.     Onychomycosis  Pain in right toes  Pain in left toes  Consent was obtained for treatment procedures.   Mechanical debridement of nails 1-5  bilaterally performed with a nail nipper.  Filed with dremel without incident.    Return office visit  3 months                   Told patient to return for periodic foot care and evaluation due to potential at risk complications.   Gardiner Barefoot DPM

## 2020-01-14 DIAGNOSIS — Z1152 Encounter for screening for COVID-19: Secondary | ICD-10-CM | POA: Diagnosis not present

## 2020-01-14 DIAGNOSIS — R0602 Shortness of breath: Secondary | ICD-10-CM | POA: Diagnosis not present

## 2020-01-14 DIAGNOSIS — R058 Other specified cough: Secondary | ICD-10-CM | POA: Diagnosis not present

## 2020-01-14 DIAGNOSIS — U071 COVID-19: Secondary | ICD-10-CM | POA: Diagnosis not present

## 2020-01-21 DIAGNOSIS — N184 Chronic kidney disease, stage 4 (severe): Secondary | ICD-10-CM | POA: Diagnosis not present

## 2020-02-02 DIAGNOSIS — I129 Hypertensive chronic kidney disease with stage 1 through stage 4 chronic kidney disease, or unspecified chronic kidney disease: Secondary | ICD-10-CM | POA: Diagnosis not present

## 2020-02-02 DIAGNOSIS — N2581 Secondary hyperparathyroidism of renal origin: Secondary | ICD-10-CM | POA: Diagnosis not present

## 2020-02-02 DIAGNOSIS — N1832 Chronic kidney disease, stage 3b: Secondary | ICD-10-CM | POA: Diagnosis not present

## 2020-02-02 DIAGNOSIS — D631 Anemia in chronic kidney disease: Secondary | ICD-10-CM | POA: Diagnosis not present

## 2020-03-22 DIAGNOSIS — M109 Gout, unspecified: Secondary | ICD-10-CM | POA: Diagnosis not present

## 2020-03-22 DIAGNOSIS — E039 Hypothyroidism, unspecified: Secondary | ICD-10-CM | POA: Diagnosis not present

## 2020-03-22 DIAGNOSIS — N184 Chronic kidney disease, stage 4 (severe): Secondary | ICD-10-CM | POA: Diagnosis not present

## 2020-03-22 DIAGNOSIS — D631 Anemia in chronic kidney disease: Secondary | ICD-10-CM | POA: Diagnosis not present

## 2020-04-06 ENCOUNTER — Ambulatory Visit: Payer: Medicare Other | Admitting: Podiatry

## 2020-04-06 ENCOUNTER — Encounter: Payer: Self-pay | Admitting: Podiatry

## 2020-04-06 ENCOUNTER — Other Ambulatory Visit: Payer: Self-pay

## 2020-04-06 DIAGNOSIS — B351 Tinea unguium: Secondary | ICD-10-CM | POA: Diagnosis not present

## 2020-04-06 DIAGNOSIS — M79675 Pain in left toe(s): Secondary | ICD-10-CM

## 2020-04-06 DIAGNOSIS — M79674 Pain in right toe(s): Secondary | ICD-10-CM

## 2020-04-06 NOTE — Progress Notes (Signed)
This patient returns to my office for at risk foot care.  This patient requires this care by a professional since this patient will be at risk due to having diabetes and chronic kidney disease   This patient is unable to cut nails herself since the patient cannot reach her nails.These nails are painful walking and wearing shoes.  This patient presents for at risk foot care today.  General Appearance  Alert, conversant and in no acute stress.  Vascular  Dorsalis pedis and posterior tibial  pulses are palpable  bilaterally.  Capillary return is within normal limits  bilaterally. Temperature is within normal limits  bilaterally.  Neurologic  Senn-Weinstein monofilament wire test within normal limits  bilaterally. Muscle power within normal limits bilaterally.  Nails Thick disfigured discolored nails with subungual debris  Hallux nails  bilaterally. No evidence of bacterial infection or drainage bilaterally.  Orthopedic  No limitations of motion  feet .  No crepitus or effusions noted.  No bony pathology or digital deformities noted.  Skin  normotropic skin with no porokeratosis noted bilaterally.  No signs of infections or ulcers noted.     Onychomycosis  Pain in right toes  Pain in left toes  Consent was obtained for treatment procedures.   Mechanical debridement of nails 1-5  bilaterally performed with a nail nipper.  Filed with dremel without incident.    Return office visit  10 weeks                   Told patient to return for periodic foot care and evaluation due to potential at risk complications.   Gardiner Barefoot DPM

## 2020-04-27 ENCOUNTER — Other Ambulatory Visit: Payer: Self-pay

## 2020-04-27 ENCOUNTER — Encounter: Payer: Self-pay | Admitting: Cardiology

## 2020-04-27 ENCOUNTER — Ambulatory Visit: Payer: Medicare Other | Admitting: Cardiology

## 2020-04-27 VITALS — BP 143/75 | HR 90 | Temp 98.4°F | Resp 16 | Ht 62.0 in | Wt 207.8 lb

## 2020-04-27 DIAGNOSIS — R0600 Dyspnea, unspecified: Secondary | ICD-10-CM | POA: Diagnosis not present

## 2020-04-27 DIAGNOSIS — R0609 Other forms of dyspnea: Secondary | ICD-10-CM | POA: Diagnosis not present

## 2020-04-27 DIAGNOSIS — I2510 Atherosclerotic heart disease of native coronary artery without angina pectoris: Secondary | ICD-10-CM

## 2020-04-27 DIAGNOSIS — E1165 Type 2 diabetes mellitus with hyperglycemia: Secondary | ICD-10-CM | POA: Diagnosis not present

## 2020-04-27 DIAGNOSIS — I10 Essential (primary) hypertension: Secondary | ICD-10-CM

## 2020-04-27 DIAGNOSIS — Z8616 Personal history of COVID-19: Secondary | ICD-10-CM

## 2020-04-27 DIAGNOSIS — Z794 Long term (current) use of insulin: Secondary | ICD-10-CM

## 2020-04-27 DIAGNOSIS — E782 Mixed hyperlipidemia: Secondary | ICD-10-CM

## 2020-04-27 DIAGNOSIS — I447 Left bundle-branch block, unspecified: Secondary | ICD-10-CM

## 2020-04-27 MED ORDER — LOSARTAN POTASSIUM 25 MG PO TABS: 25.0000 mg | ORAL_TABLET | Freq: Every morning | ORAL | 0 refills | Status: DC

## 2020-04-27 MED ORDER — ASPIRIN EC 81 MG PO TBEC: 81.0000 mg | DELAYED_RELEASE_TABLET | Freq: Every day | ORAL | 0 refills | Status: DC

## 2020-04-27 NOTE — Progress Notes (Signed)
Date:  04/27/2020   ID:  [name], [name] 06/02/1940, MRN [id number]  PCP:  Haywood Pao, MD  Cardiologist:  Rex Kras, DO, [facility] (established care 04/27/2020) Former Cardiology Providers: Dr. Adrian Prows   REASON FOR CONSULT: Heart Failure   REQUESTING PHYSICIAN:  [name], [name], MD [address] [place],  [place] [address]  Chief Complaint  Patient presents with  . Heart failure, unspecified  . New Patient (Initial Visit)    HPI  Connie Swanson is a 80 y.o. female who presents to the office with a chief complaint of " shortness of breath." Patient's past medical history and cardiovascular risk factors include: Single-vessel CAD (nonobstructive), insulin-dependent diabetes mellitus type 2, chronic kidney disease, hypertension, history of ischemic colitis, hypothyroidism, obesity due to excess calories, vertigo, history of COVID-19 infection (January 2022), postmenopausal female, advanced age.  She is referred to the office at the request of [name], [name], MD for evaluation of heart failure.  Patient states that for the last several months she has been experiencing effort related dyspnea.  The symptoms have been getting progressively worse.  Patient states that she gets short of breath if she walks from the living room to the bedroom and feels tired and fatigued as well.  If she is in the kitchen making dinner she can only stand in one place for 10 to 15 minutes before she gets exhausted or experiences shortness of breath.  Patient denies any lower extremity swelling.  And hard to ascertain if she has orthopnea or paroxysmal nocturnal dyspnea as if she sleeps in a recliner for the last 20 years given her vertigo.  Patient saw Dr. Einar Gip in the past and had undergone a right and left heart catheterization.  She was noted to have nonobstructive CAD involving the LAD distribution.  Prior left heart catheterization results reviewed from 2012.  FUNCTIONAL STATUS: No structured  exercise program or daily routine.   ALLERGIES: No Known Allergies  MEDICATION LIST PRIOR TO VISIT: Current Meds  Medication Sig  . allopurinol (ZYLOPRIM) 300 MG tablet Take 300 mg by mouth daily.  Marland Kitchen aspirin EC 81 MG tablet Take 1 tablet (81 mg total) by mouth daily. Swallow whole.  Marland Kitchen atorvastatin (LIPITOR) 40 MG tablet   . B-D INS SYR ULTRAFINE 1CC/30G 30G X 1/2" 1 ML MISC USE SYRINGE AS DIRECTED TWICE DAILY  . carvedilol (COREG) 25 MG tablet Take 25 mg by mouth 2 (two) times daily.  . furosemide (LASIX) 20 MG tablet Take 1 tablet by mouth 2 (two) times daily.  Marland Kitchen losartan (COZAAR) 25 MG tablet Take 1 tablet (25 mg total) by mouth every morning.  . metFORMIN (GLUCOPHAGE) 1000 MG tablet   . NOVOLIN 70/30 RELION (70-30) 100 UNIT/ML injection SMARTSIG:60 Unit(s) SUB-Q Twice Daily  . [DISCONTINUED] quinapril (ACCUPRIL) 10 MG tablet Take 10 mg by mouth 2 (two) times daily.     PAST MEDICAL HISTORY: Past Medical History:  Diagnosis Date  . Chronic kidney disease   . Coronary artery disease   . Diabetes mellitus without complication ([place])   . Hyperlipidemia   . Hypertension     PAST SURGICAL HISTORY: Past Surgical History:  Procedure Laterality Date  . BREAST EXCISIONAL BIOPSY Right   . HEMORRHOID SURGERY    . MENISCUS REPAIR    . THYROID SURGERY    . VAGINAL HYSTERECTOMY      FAMILY HISTORY: The patient family history is not on file. She was adopted.  SOCIAL HISTORY:  The patient  reports that she has never smoked. She has never used smokeless tobacco. She reports that she does not drink alcohol and does not use drugs.  REVIEW OF SYSTEMS: Review of Systems  Constitutional: Positive for malaise/fatigue. Negative for chills and fever.  HENT: Negative for hoarse voice and nosebleeds.   Eyes: Negative for discharge, double vision and pain.  Cardiovascular: Positive for dyspnea on exertion. Negative for chest pain, claudication, leg swelling, near-syncope, orthopnea,  palpitations, paroxysmal nocturnal dyspnea and syncope.  Respiratory: Positive for shortness of breath. Negative for hemoptysis.   Musculoskeletal: Negative for muscle cramps and myalgias.  Gastrointestinal: Negative for abdominal pain, constipation, diarrhea, hematemesis, hematochezia, melena, nausea and vomiting.  Neurological: Negative for dizziness and light-headedness.    PHYSICAL EXAM: Vitals with BMI 04/27/2020 10/06/2019 10/06/2019  Height '5\' 2"'$  - -  Weight 207 lbs 13 oz - -  BMI 38 - -  Systolic [id number]  Diastolic 75 60 85  Pulse 90 70 93    CONSTITUTIONAL: Age-appropriate female, hemodynamically stable, no acute distress.    SKIN: Skin is warm and dry. No rash noted. No cyanosis. No pallor. No jaundice HEAD: Normocephalic and atraumatic.  EYES: No scleral icterus MOUTH/THROAT: Moist oral membranes.  NECK: Short neck, increased adipose tissue, no JVD present. No thyromegaly noted. No carotid bruits  LYMPHATIC: No visible cervical adenopathy.  CHEST Normal respiratory effort. No intercostal retractions  LUNGS: Clear to auscultation bilaterally.  No stridor. No wheezes. No rales.  CARDIOVASCULAR: Regular rate and rhythm, positive Q000111Q, soft holosystolic murmur heard at the apex, no gallops or rubs ABDOMINAL: Obese, nontender, nondistended, positive bowel sounds in all 4 quadrants, no apparent ascites.  EXTREMITIES: No peripheral edema,  2+ bilateral posterior tibial pulses. HEMATOLOGIC: No significant bruising NEUROLOGIC: Oriented to person, place, and time. Nonfocal. Normal muscle tone.  PSYCHIATRIC: Normal mood and affect. Normal behavior. Cooperative  CARDIAC DATABASE: EKG: 04/27/2020: Normal sinus rhythm, 76 bpm, left axis, left bundle branch block.   Echocardiogram: No results found for this or any previous visit from the past 1095 days.   Stress Testing: No results found for this or any previous visit from the past 1095 days.  Heart  Catheterization: 10/10/2010: HEMODYNAMIC DATA:  Right heart catheterization:  RA pressure 7/7, mean 6 mmHg.  RA saturation 76%.  RV pressure 123456, end-diastolic pressure 4 mmHg.  PA pressure 27/40 with a mean of 21 mmHg.  PA saturation was 75%.  Pulmonary capillary wedge 6/5 with a mean of 3 mmHg.  Aortic saturation was 100%.  Cardiac output was 6.4 with a cardiac index of 3.34 by Fick.  Right coronary artery:  Right coronary artery is a large caliber and superdominant vessel.  Gives origin to large PL branch and 2 PDA large branches.  Smooth and normal.  Left main coronary artery:  Left main coronary artery is a large-caliber vessel, which is smooth and normal.  Circumflex coronary artery:  Circumflex coronary artery is a very large- caliber vessel.  Gives origin to a moderate-sized obtuse marginal 1 and continues distally as a large obtuse marginal 2.  Smooth and normal.  LAD:  LAD is a large-caliber vessel in the proximal segment.  Gives origin to a moderate-sized diagonal 1 and then gives origin to a very large diagonal 2, which is bigger than the LAD itself.  Between diagonal 1 and diagonal 2, there is a eccentric 50% stenosis noted in the LAD. The diagonal 2, which is very large has a ostial 30%  stenosis. Otherwise, except for this focal stenosis of 50%, there is no other lesion noted in the LAD or the large diagonal 2.  LABORATORY DATA: No flowsheet data found.  No flowsheet data found.  Lipid Panel  No results found for: CHOL, TRIG, HDL, CHOLHDL, VLDL, LDLCALC, LDLDIRECT, LABVLDL  No components found for: NTPROBNP No results for input(s): PROBNP in the last 8760 hours. No results for input(s): TSH in the last 8760 hours.  BMP No results for input(s): NA, K, CL, CO2, GLUCOSE, BUN, CREATININE, CALCIUM, GFRNONAA, GFRAA in the last 8760 hours.  HEMOGLOBIN A1C No results found for: HGBA1C, MPG  External labs: 09/07/2019:  Total cholesterol 147,  triglycerides 186, HDL 27, LDL 83, non-HDL 120 TSH 2.15 Hemoglobin A1c 6.5 White count 11.75, hemoglobin 11.1 g/dL, hematocrit 34.3%  IMPRESSION:    ICD-10-CM   1. Dyspnea on exertion  123456 Basic metabolic panel    Pro b natriuretic peptide (BNP)    Magnesium    D-dimer, quantitative    PCV ECHOCARDIOGRAM COMPLETE    PCV MYOCARDIAL PERFUSION WITH LEXISCAN    losartan (COZAAR) 25 MG tablet  2. Nonobstructive atherosclerosis of coronary artery  I25.10 EKG 12-Lead    PCV ECHOCARDIOGRAM COMPLETE    PCV MYOCARDIAL PERFUSION WITH LEXISCAN  3. Type 2 diabetes mellitus with hyperglycemia, with long-term current use of insulin (HCC)  E11.65    Z79.4   4. Long-term insulin use (HCC)  Z79.4   5. History of COVID-19  Z86.16   6. Mixed hyperlipidemia  E78.2   7. Benign hypertension  I10 losartan (COZAAR) 25 MG tablet  8. LBBB (left bundle branch block)  I44.7   9. Class 2 severe obesity due to excess calories with serious comorbidity and body mass index (BMI) of 38.0 to 38.9 in adult [facility])  E66.01    Z68.38      RECOMMENDATIONS: Connie Swanson is a 80 y.o. female whose past medical history and cardiac risk factors include: Single-vessel CAD (nonobstructive), insulin-dependent diabetes mellitus type 2, chronic kidney disease, hypertension, history of ischemic colitis, hypothyroidism, obesity due to excess calories, vertigo, history of COVID-19 infection (January 2022), postmenopausal female, advanced age.  Dyspnea on exertion:  Chronic but progressive.  Overall euvolemic on physical examination.  Check labs: BMP, BNP, magnesium level, and D-dimer  Echocardiogram will be ordered to evaluate for structural heart disease and left ventricular systolic function.  Nuclear stress test recommended to evaluate for reversible ischemia.  Recommend better blood pressure management.  Medications reconciled.  Patient is asked to seek medical attention sooner if her symptoms increase in intensity,  frequency, and/or duration.  We will focus on up titration of guideline directed medical therapy.  Nonobstructive CAD:  Patient noted to have single-vessel CAD back in 2012 as per the left heart catheterization.  EKG shows normal sinus rhythm with left bundle branch block.  No prior EKGs for comparison but patient states that she is noted to have a left bundle branch block.  Start aspirin 81 mg p.o. daily.  Continue beta-blocker therapy, will transition her from ACE inhibitor's to ARB's, continue Lasix.  Continue statin therapy.  Educated on the importance of glycemic control.  Benign essential hypertension:  Currently taking quinapril 10 mg p.o. twice daily.  We will transition her to losartan 25 mg p.o. every morning this is in the hopes that if she needs to be transition to [place] it would be easier.  Recommended low-salt diet.  She is asked to keep a log of  her blood pressures and to bring it in at the next office visit with either myself or her PCP.  Currently managed by primary care provider.  Insulin-dependent diabetes mellitus type 2:  Most recent hemoglobin A1c within acceptable range.  Educated on the importance of glycemic control.  Currently managed by primary care provider.  Obesity, due to excess calories: . Body mass index is 38.01 kg/m. . I reviewed with the patient the importance of diet, regular physical activity/exercise, weight loss.   . Patient is educated on increasing physical activity gradually as tolerated.  With the goal of moderate intensity exercise for 30 minutes a day 5 days a week.  FINAL MEDICATION LIST END OF ENCOUNTER: Meds ordered this encounter  Medications  . aspirin EC 81 MG tablet    Sig: Take 1 tablet (81 mg total) by mouth daily. Swallow whole.    Dispense:  90 tablet    Refill:  0  . losartan (COZAAR) 25 MG tablet    Sig: Take 1 tablet (25 mg total) by mouth every morning.    Dispense:  90 tablet    Refill:  0     Medications Discontinued During This Encounter  Medication Reason  . cephALEXin (KEFLEX) 500 MG capsule Error  . Choline Fenofibrate (FENOFIBRIC ACID) 135 MG CPDR Error  . insulin detemir (LEVEMIR) 100 UNIT/ML injection Error  . liraglutide (VICTOZA) 18 MG/3ML SOPN Error  . UNABLE TO FIND Error  . quinapril (ACCUPRIL) 10 MG tablet Discontinued by provider     Current Outpatient Medications:  .  allopurinol (ZYLOPRIM) 300 MG tablet, Take 300 mg by mouth daily., Disp: , Rfl:  .  aspirin EC 81 MG tablet, Take 1 tablet (81 mg total) by mouth daily. Swallow whole., Disp: 90 tablet, Rfl: 0 .  atorvastatin (LIPITOR) 40 MG tablet, , Disp: , Rfl:  .  B-D INS SYR ULTRAFINE 1CC/30G 30G X 1/2" 1 ML MISC, USE SYRINGE AS DIRECTED TWICE DAILY, Disp: , Rfl:  .  carvedilol (COREG) 25 MG tablet, Take 25 mg by mouth 2 (two) times daily., Disp: , Rfl:  .  furosemide (LASIX) 20 MG tablet, Take 1 tablet by mouth 2 (two) times daily., Disp: , Rfl:  .  losartan (COZAAR) 25 MG tablet, Take 1 tablet (25 mg total) by mouth every morning., Disp: 90 tablet, Rfl: 0 .  metFORMIN (GLUCOPHAGE) 1000 MG tablet, , Disp: , Rfl:  .  NOVOLIN 70/30 RELION (70-30) 100 UNIT/ML injection, SMARTSIG:60 Unit(s) SUB-Q Twice Daily, Disp: , Rfl:   Orders Placed This Encounter  Procedures  . Basic metabolic panel  . Pro b natriuretic peptide (BNP)  . Magnesium  . D-dimer, quantitative  . PCV MYOCARDIAL PERFUSION WITH LEXISCAN  . EKG 12-Lead  . PCV ECHOCARDIOGRAM COMPLETE    There are no Patient Instructions on file for this visit.   --Continue cardiac medications as reconciled in final medication list. --Return in about 1 month (around 05/30/2020) for Follow up, Dyspnea. Or sooner if needed. --Continue follow-up with your primary care physician regarding the management of your other chronic comorbid conditions.  Patient's questions and concerns were addressed to her satisfaction. She voices understanding of the instructions  provided during this encounter.   This note was created using a voice recognition software as a result there may be grammatical errors inadvertently enclosed that do not reflect the nature of this encounter. Every attempt is made to correct such errors.  Rex Kras, [place], [facility]  Pager: [phone] Office: [phone]

## 2020-04-28 ENCOUNTER — Ambulatory Visit (HOSPITAL_COMMUNITY)
Admission: RE | Admit: 2020-04-28 | Discharge: 2020-04-28 | Disposition: A | Discharge status: Home / Self Care | Payer: Medicare Other | Source: Ambulatory Visit | Attending: Cardiology | Admitting: Cardiology

## 2020-04-28 ENCOUNTER — Other Ambulatory Visit: Payer: Self-pay | Admitting: Cardiology

## 2020-04-28 DIAGNOSIS — R0609 Other forms of dyspnea: Secondary | ICD-10-CM

## 2020-04-28 DIAGNOSIS — R079 Chest pain, unspecified: Secondary | ICD-10-CM | POA: Diagnosis not present

## 2020-04-28 DIAGNOSIS — R0600 Dyspnea, unspecified: Secondary | ICD-10-CM | POA: Diagnosis not present

## 2020-04-28 DIAGNOSIS — R0602 Shortness of breath: Secondary | ICD-10-CM | POA: Diagnosis not present

## 2020-04-28 DIAGNOSIS — R791 Abnormal coagulation profile: Secondary | ICD-10-CM | POA: Diagnosis not present

## 2020-04-28 LAB — BASIC METABOLIC PANEL
BUN/Creatinine Ratio: 15 (ref 12–28)
BUN: 28 mg/dL — ABNORMAL HIGH (ref 8–27)
CO2: 23 mmol/L (ref 20–29)
Calcium: 9.3 mg/dL (ref 8.7–10.3)
Chloride: 99 mmol/L (ref 96–106)
Creatinine, Ser: 1.87 mg/dL — ABNORMAL HIGH (ref 0.57–1.00)
Glucose: 185 mg/dL — ABNORMAL HIGH (ref 65–99)
Potassium: 5 mmol/L (ref 3.5–5.2)
Sodium: 143 mmol/L (ref 134–144)
eGFR: 27 mL/min/{1.73_m2} — ABNORMAL LOW (ref >59)

## 2020-04-28 LAB — MAGNESIUM: Magnesium: 1.6 mg/dL (ref 1.6–2.3)

## 2020-04-28 LAB — D-DIMER, QUANTITATIVE: D-DIMER: 1.81 mg/L FEU — ABNORMAL HIGH (ref 0.00–0.49)

## 2020-04-28 LAB — PRO B NATRIURETIC PEPTIDE: NT-Pro BNP: 1000 pg/mL — ABNORMAL HIGH (ref 0–738)

## 2020-04-28 MED ORDER — TECHNETIUM TO 99M ALBUMIN AGGREGATED
4.2000 | Freq: Once | INTRAVENOUS | Status: AC | PRN
  Administered 2020-04-28: 4.2 via INTRAVENOUS

## 2020-04-28 NOTE — Progress Notes (Signed)
Patient has been made aware and y have spoken to patient also patient is going today '@1'$  for her vq scan

## 2020-04-28 NOTE — Progress Notes (Signed)
Called pt, Dr.Tolia spoke to pt regarding lab results.

## 2020-04-28 NOTE — Progress Notes (Signed)
Elevated D-dimer in the setting of dyspnea on exertion. Due to renal insufficiency recommended VQ scan.  Spoke to the patient and discussed the findings of the most recent lab work.  I have also asked her to reduce the Lasix to once a day.  She will need repeat blood work in 1 week to reevaluate kidney function.  Rex Kras, [place], [facility]  Pager: [phone] Office: [phone]

## 2020-04-29 NOTE — Progress Notes (Signed)
Patient Is aware

## 2020-05-04 ENCOUNTER — Ambulatory Visit: Payer: Medicare Other

## 2020-05-04 ENCOUNTER — Other Ambulatory Visit: Payer: Self-pay

## 2020-05-04 DIAGNOSIS — I2510 Atherosclerotic heart disease of native coronary artery without angina pectoris: Secondary | ICD-10-CM | POA: Diagnosis not present

## 2020-05-04 DIAGNOSIS — R0600 Dyspnea, unspecified: Secondary | ICD-10-CM

## 2020-05-04 DIAGNOSIS — R0609 Other forms of dyspnea: Secondary | ICD-10-CM | POA: Diagnosis not present

## 2020-05-07 DIAGNOSIS — I509 Heart failure, unspecified: Secondary | ICD-10-CM | POA: Diagnosis not present

## 2020-05-07 DIAGNOSIS — I130 Hypertensive heart and chronic kidney disease with heart failure and stage 1 through stage 4 chronic kidney disease, or unspecified chronic kidney disease: Secondary | ICD-10-CM | POA: Diagnosis not present

## 2020-05-07 DIAGNOSIS — N184 Chronic kidney disease, stage 4 (severe): Secondary | ICD-10-CM | POA: Diagnosis not present

## 2020-05-07 DIAGNOSIS — E1122 Type 2 diabetes mellitus with diabetic chronic kidney disease: Secondary | ICD-10-CM | POA: Diagnosis not present

## 2020-05-09 NOTE — Progress Notes (Signed)
Called pt to inform her about her echo results. Pt understood.

## 2020-05-11 ENCOUNTER — Ambulatory Visit: Payer: Medicare Other

## 2020-05-11 ENCOUNTER — Other Ambulatory Visit: Payer: Self-pay

## 2020-05-11 DIAGNOSIS — R0600 Dyspnea, unspecified: Secondary | ICD-10-CM

## 2020-05-11 DIAGNOSIS — I2510 Atherosclerotic heart disease of native coronary artery without angina pectoris: Secondary | ICD-10-CM

## 2020-05-11 DIAGNOSIS — R0609 Other forms of dyspnea: Secondary | ICD-10-CM

## 2020-05-15 ENCOUNTER — Other Ambulatory Visit: Payer: Self-pay | Admitting: Cardiology

## 2020-05-15 DIAGNOSIS — N179 Acute kidney failure, unspecified: Secondary | ICD-10-CM

## 2020-05-15 DIAGNOSIS — R0600 Dyspnea, unspecified: Secondary | ICD-10-CM

## 2020-05-15 DIAGNOSIS — R0609 Other forms of dyspnea: Secondary | ICD-10-CM

## 2020-05-16 NOTE — Progress Notes (Signed)
Spoke to patient she is aware

## 2020-05-25 DIAGNOSIS — N1832 Chronic kidney disease, stage 3b: Secondary | ICD-10-CM | POA: Diagnosis not present

## 2020-05-25 DIAGNOSIS — N179 Acute kidney failure, unspecified: Secondary | ICD-10-CM | POA: Diagnosis not present

## 2020-05-25 DIAGNOSIS — R0600 Dyspnea, unspecified: Secondary | ICD-10-CM | POA: Diagnosis not present

## 2020-05-26 LAB — BASIC METABOLIC PANEL
BUN/Creatinine Ratio: 13 (ref 12–28)
BUN: 20 mg/dL (ref 8–27)
CO2: 25 mmol/L (ref 20–29)
Calcium: 9.6 mg/dL (ref 8.7–10.3)
Chloride: 99 mmol/L (ref 96–106)
Creatinine, Ser: 1.55 mg/dL — ABNORMAL HIGH (ref 0.57–1.00)
Glucose: 124 mg/dL — ABNORMAL HIGH (ref 65–99)
Potassium: 4.8 mmol/L (ref 3.5–5.2)
Sodium: 138 mmol/L (ref 134–144)
eGFR: 33 mL/min/{1.73_m2} — ABNORMAL LOW (ref >59)

## 2020-05-26 LAB — MAGNESIUM: Magnesium: 2.1 mg/dL (ref 1.6–2.3)

## 2020-05-26 NOTE — Progress Notes (Signed)
Called and spoke with patient regarding her lab results.

## 2020-05-30 DIAGNOSIS — N2581 Secondary hyperparathyroidism of renal origin: Secondary | ICD-10-CM | POA: Diagnosis not present

## 2020-05-30 DIAGNOSIS — I129 Hypertensive chronic kidney disease with stage 1 through stage 4 chronic kidney disease, or unspecified chronic kidney disease: Secondary | ICD-10-CM | POA: Diagnosis not present

## 2020-05-30 DIAGNOSIS — N1832 Chronic kidney disease, stage 3b: Secondary | ICD-10-CM | POA: Diagnosis not present

## 2020-05-30 DIAGNOSIS — D631 Anemia in chronic kidney disease: Secondary | ICD-10-CM | POA: Diagnosis not present

## 2020-06-07 DIAGNOSIS — I130 Hypertensive heart and chronic kidney disease with heart failure and stage 1 through stage 4 chronic kidney disease, or unspecified chronic kidney disease: Secondary | ICD-10-CM | POA: Diagnosis not present

## 2020-06-07 DIAGNOSIS — E1122 Type 2 diabetes mellitus with diabetic chronic kidney disease: Secondary | ICD-10-CM | POA: Diagnosis not present

## 2020-06-07 DIAGNOSIS — N184 Chronic kidney disease, stage 4 (severe): Secondary | ICD-10-CM | POA: Diagnosis not present

## 2020-06-07 DIAGNOSIS — I509 Heart failure, unspecified: Secondary | ICD-10-CM | POA: Diagnosis not present

## 2020-06-15 ENCOUNTER — Ambulatory Visit: Payer: Medicare Other | Admitting: Podiatry

## 2020-06-15 ENCOUNTER — Other Ambulatory Visit: Payer: Self-pay

## 2020-06-15 ENCOUNTER — Encounter: Payer: Self-pay | Admitting: Podiatry

## 2020-06-15 DIAGNOSIS — M79674 Pain in right toe(s): Secondary | ICD-10-CM

## 2020-06-15 DIAGNOSIS — M79675 Pain in left toe(s): Secondary | ICD-10-CM | POA: Diagnosis not present

## 2020-06-15 DIAGNOSIS — B351 Tinea unguium: Secondary | ICD-10-CM | POA: Diagnosis not present

## 2020-06-15 NOTE — Progress Notes (Signed)
This patient returns to my office for at risk foot care.  This patient requires this care by a professional since this patient will be at risk due to having diabetes and chronic kidney disease   This patient is unable to cut nails herself since the patient cannot reach her nails.These nails are painful walking and wearing shoes.  This patient presents for at risk foot care today.  General Appearance  Alert, conversant and in no acute stress.  Vascular  Dorsalis pedis and posterior tibial  pulses are palpable  bilaterally.  Capillary return is within normal limits  bilaterally. Temperature is within normal limits  bilaterally.  Neurologic  Senn-Weinstein monofilament wire test within normal limits  bilaterally. Muscle power within normal limits bilaterally.  Nails Thick disfigured discolored nails with subungual debris  Hallux nails  bilaterally. No evidence of bacterial infection or drainage bilaterally.  Orthopedic  No limitations of motion  feet .  No crepitus or effusions noted.  No bony pathology or digital deformities noted.Mild  HAV  B/L.  Skin  normotropic skin with no porokeratosis noted bilaterally.  No signs of infections or ulcers noted.     Onychomycosis  Pain in right toes  Pain in left toes  Consent was obtained for treatment procedures.   Mechanical debridement of nails 1-5  bilaterally performed with a nail nipper.  Filed with dremel without incident.    Return office visit  10 weeks                   Told patient to return for periodic foot care and evaluation due to potential at risk complications.   Gardiner Barefoot DPM

## 2020-06-17 ENCOUNTER — Encounter: Payer: Self-pay | Admitting: Cardiology

## 2020-06-17 ENCOUNTER — Ambulatory Visit: Payer: Medicare Other | Admitting: Cardiology

## 2020-06-17 ENCOUNTER — Other Ambulatory Visit: Payer: Self-pay

## 2020-06-17 VITALS — BP 143/72 | HR 69 | Temp 98.2°F | Resp 17 | Ht 62.0 in | Wt 208.6 lb

## 2020-06-17 DIAGNOSIS — E1165 Type 2 diabetes mellitus with hyperglycemia: Secondary | ICD-10-CM | POA: Diagnosis not present

## 2020-06-17 DIAGNOSIS — I2510 Atherosclerotic heart disease of native coronary artery without angina pectoris: Secondary | ICD-10-CM | POA: Diagnosis not present

## 2020-06-17 DIAGNOSIS — R0609 Other forms of dyspnea: Secondary | ICD-10-CM

## 2020-06-17 DIAGNOSIS — I447 Left bundle-branch block, unspecified: Secondary | ICD-10-CM

## 2020-06-17 DIAGNOSIS — I5032 Chronic diastolic (congestive) heart failure: Secondary | ICD-10-CM

## 2020-06-17 DIAGNOSIS — I10 Essential (primary) hypertension: Secondary | ICD-10-CM

## 2020-06-17 DIAGNOSIS — Z794 Long term (current) use of insulin: Secondary | ICD-10-CM

## 2020-06-17 DIAGNOSIS — E782 Mixed hyperlipidemia: Secondary | ICD-10-CM

## 2020-06-17 DIAGNOSIS — Z8616 Personal history of COVID-19: Secondary | ICD-10-CM

## 2020-06-17 DIAGNOSIS — R0600 Dyspnea, unspecified: Secondary | ICD-10-CM

## 2020-06-17 MED ORDER — ASPIRIN EC 81 MG PO TBEC: 81.0000 mg | DELAYED_RELEASE_TABLET | Freq: Every day | ORAL | 0 refills | Status: DC

## 2020-06-17 MED ORDER — ENTRESTO 49-51 MG PO TABS: 1.0000 | ORAL_TABLET | Freq: Two times a day (BID) | ORAL | 0 refills | Status: DC

## 2020-06-17 NOTE — Progress Notes (Signed)
Date:  06/17/2020   ID:  Connie Swanson, DOB 1940-01-12, MRN [id number]  PCP:  Connie Pao, MD  Cardiologist:  Rex Kras, DO, [facility] (established care 04/27/2020) Former Cardiology Providers: Dr. Adrian Prows   Date: 06/17/20 Last Office Visit: 04/27/2020  Chief Complaint  Patient presents with   Shortness of Breath   Results     HPI  Connie Swanson is a 80 y.o. female who presents to the office with a chief complaint of " reevaluation of shortness of breath and discuss test results." Patient's past medical history and cardiovascular risk factors include: Single-vessel CAD (nonobstructive), insulin-dependent diabetes mellitus type 2, chronic kidney disease, hypertension, history of ischemic colitis, hypothyroidism, obesity due to excess calories, vertigo, history of COVID-19 infection (January 2022), postmenopausal female, advanced age.  She is referred to the office at the request of [name], [name], MD for evaluation of heart failure.  During initial consultation patient is noted that she was experiencing dyspnea on exertion for several months prior to seeking medical attention.  Her symptoms are progressive to the point that she would get dyspneic by walking from the living room to her bedroom.  At last office visit the plan was to evaluate her for congestive heart failure, pulmonary embolism, and CAD.  After the last office visit, D-dimers were elevated and due to renal insufficiency she underwent a VQ scan which noted low probability for pulmonary embolism.  Since last visit patient continues to be short of breath with effort related activities.  Labs notable for NT proBNP of thousand.  Diastolic dysfunction not commented on due to mitral annular calcification looking of the hemodynamics on the most recent echocardiogram notes elevated left atrial pressures.  I suspect that she does have heart failure with preserved EF given her symptoms, laboratory findings, and hemodynamics noted on  echocardiogram.  Patient's nuclear stress test was reported to be overall a high risk study due to reduced LVEF per gated SPECT.  Reviewed images with the patient as well at today's visit.  FUNCTIONAL STATUS: No structured exercise program or daily routine.   ALLERGIES: No Known Allergies  MEDICATION LIST PRIOR TO VISIT: Current Meds  Medication Sig   allopurinol (ZYLOPRIM) 300 MG tablet Take 300 mg by mouth daily.   atorvastatin (LIPITOR) 40 MG tablet    B-D INS SYR ULTRAFINE 1CC/30G 30G X 1/2" 1 ML MISC USE SYRINGE AS DIRECTED TWICE DAILY   carvedilol (COREG) 25 MG tablet Take 25 mg by mouth 2 (two) times daily.   metFORMIN (GLUCOPHAGE) 1000 MG tablet    NOVOLIN 70/30 RELION (70-30) 100 UNIT/ML injection SMARTSIG:60 Unit(s) SUB-Q Twice Daily   sacubitril-valsartan (ENTRESTO) 49-51 MG Take 1 tablet by mouth 2 (two) times daily.   [DISCONTINUED] furosemide (LASIX) 20 MG tablet Take 1 tablet by mouth daily.   [DISCONTINUED] losartan (COZAAR) 25 MG tablet Take 1 tablet (25 mg total) by mouth every morning.     PAST MEDICAL HISTORY: Past Medical History:  Diagnosis Date   Chronic kidney disease    Coronary artery disease    Diabetes mellitus without complication ([place])    Hyperlipidemia    Hypertension     PAST SURGICAL HISTORY: Past Surgical History:  Procedure Laterality Date   BREAST EXCISIONAL BIOPSY Right    HEMORRHOID SURGERY     MENISCUS REPAIR     THYROID SURGERY     VAGINAL HYSTERECTOMY      FAMILY HISTORY: The patient family history is not on file. She  was adopted.  SOCIAL HISTORY:  The patient  reports that she has never smoked. She has never used smokeless tobacco. She reports that she does not drink alcohol and does not use drugs.  REVIEW OF SYSTEMS: Review of Systems  Constitutional: Positive for malaise/fatigue. Negative for chills and fever.  HENT:  Negative for hoarse voice and nosebleeds.   Eyes:  Negative for discharge, double vision and pain.   Cardiovascular:  Positive for dyspnea on exertion. Negative for chest pain, claudication, leg swelling, near-syncope, orthopnea, palpitations, paroxysmal nocturnal dyspnea and syncope.  Respiratory:  Positive for shortness of breath. Negative for hemoptysis.   Musculoskeletal:  Negative for muscle cramps and myalgias.  Gastrointestinal:  Negative for abdominal pain, constipation, diarrhea, hematemesis, hematochezia, melena, nausea and vomiting.  Neurological:  Negative for dizziness and light-headedness.   PHYSICAL EXAM: Vitals with BMI 06/17/2020 06/17/2020 04/27/2020  Height - '5\' 2"'$  '5\' 2"'$   Weight - 208 lbs 10 oz 207 lbs 13 oz  BMI - [id number]  Systolic [id number]  Diastolic 72 78 75  Pulse 69 79 90    CONSTITUTIONAL: Age-appropriate female, hemodynamically stable, no acute distress.    SKIN: Skin is warm and dry. No rash noted. No cyanosis. No pallor. No jaundice HEAD: Normocephalic and atraumatic.  EYES: No scleral icterus MOUTH/THROAT: Moist oral membranes.  NECK: Short neck, increased adipose tissue, no JVD present. No thyromegaly noted. No carotid bruits  LYMPHATIC: No visible cervical adenopathy.  CHEST Normal respiratory effort. No intercostal retractions  LUNGS: Clear to auscultation bilaterally.  No stridor. No wheezes. No rales.  CARDIOVASCULAR: Regular rate and rhythm, positive [id number], soft holosystolic murmur heard at the apex, no gallops or rubs ABDOMINAL: Obese, nontender, nondistended, positive bowel sounds in all 4 quadrants, no apparent ascites.  EXTREMITIES: No peripheral edema,  2+ bilateral posterior tibial pulses. HEMATOLOGIC: No significant bruising NEUROLOGIC: Oriented to person, place, and time. Nonfocal. Normal muscle tone.  PSYCHIATRIC: Normal mood and affect. Normal behavior. Cooperative  RADIOLOGY:  VQ Scan:  04/28/2020: Negative perfusion lung scan for pulmonary embolism.  CARDIAC DATABASE: EKG: 04/27/2020: Normal sinus rhythm, 76 bpm, left axis, left  bundle branch block.   Echocardiogram: 05/04/2020: Normal LV systolic function with visual EF 50-55%. Left ventricle cavity is normal in size. Severe concentric hypertrophy of the left ventricle. Normal global wall motion. Unable to evaluate diastolic function due to mitral annular calcification. Elevated LAP.  Native mitral valve. Posterior annular calcification. No evidence of mitral stenosis. Mild (Grade I) mitral regurgitation. Mild tricuspid regurgitation. No evidence of pulmonary hypertension. RVSP measures 32 mmHg. No prior studies available for comparison.   Stress Testing: Lexiscan Tetrofosmin stress test 05/11/2020: Lexiscan nuclear stress test performed using 1-day protocol. SPECT images show uniform breast tissue attenuation in both rest and stress images. In addition, there is medium sized, medium intensity, fixed perfusion defect in basal inferior/inferoseptal myocardium. All segments of left ventricle demonstrated septal dyskinesis, global hypokinesis. Stress LVEF 34%. High risk study due to low stress LVEF.  Heart Catheterization: 10/10/2010: HEMODYNAMIC DATA:  Right heart catheterization:  RA pressure 7/7, mean 6 mmHg.  RA saturation 76%.   RV pressure 123456, end-diastolic pressure 4 mmHg.   PA pressure 27/40 with a mean of 21 mmHg.  PA saturation was 75%.   Pulmonary capillary wedge 6/5 with a mean of 3 mmHg.  Aortic saturation was 100%.   Cardiac output was 6.4 with a cardiac index of 3.34 by Fick.   Right coronary artery:  Right coronary artery  is a large caliber and superdominant vessel.  Gives origin to large PL branch and 2 PDA large branches.  Smooth and normal.   Left main coronary artery:  Left main coronary artery is a large-caliber vessel, which is smooth and normal.   Circumflex coronary artery:  Circumflex coronary artery is a very large- caliber vessel.  Gives origin to a moderate-sized obtuse marginal 1 and continues distally as a large obtuse  marginal 2.  Smooth and normal.   LAD:  LAD is a large-caliber vessel in the proximal segment.  Gives origin to a moderate-sized diagonal 1 and then gives origin to a very large diagonal 2, which is bigger than the LAD itself.  Between diagonal 1 and diagonal 2, there is a eccentric 50% stenosis noted in the LAD. The diagonal 2, which is very large has a ostial 30% stenosis. Otherwise, except for this focal stenosis of 50%, there is no other lesion noted in the LAD or the large diagonal 2.  LABORATORY DATA: No flowsheet data found.  CMP Latest Ref Rng & Units 05/25/2020 04/27/2020  Glucose 65 - 99 mg/dL 124(H) 185(H)  BUN 8 - 27 mg/dL 20 28(H)  Creatinine 0.57 - 1.00 mg/dL 1.55(H) 1.87(H)  Sodium 134 - 144 mmol/L 138 143  Potassium 3.5 - 5.2 mmol/L 4.8 5.0  Chloride 96 - 106 mmol/L 99 99  CO2 20 - 29 mmol/L 25 23  Calcium 8.7 - 10.3 mg/dL 9.6 9.3    Lipid Panel  No results found for: CHOL, TRIG, HDL, CHOLHDL, VLDL, LDLCALC, LDLDIRECT, LABVLDL  No components found for: NTPROBNP Recent Labs    04/27/20 1358  PROBNP 1,000*   No results for input(s): TSH in the last 8760 hours.  BMP Recent Labs    04/27/20 1358 05/25/20 1118  NA 143 138  K 5.0 4.8  CL 99 99  CO2 23 25  GLUCOSE 185* 124*  BUN 28* 20  CREATININE 1.87* 1.55*  CALCIUM 9.3 9.6    HEMOGLOBIN A1C No results found for: HGBA1C, MPG  External labs: 09/07/2019:  Total cholesterol 147, triglycerides 186, HDL 27, LDL 83, non-HDL 120 TSH 2.15 Hemoglobin A1c 6.5 White count 11.75, hemoglobin 11.1 g/dL, hematocrit 34.3%  IMPRESSION:    ICD-10-CM   1. Chronic heart failure with preserved ejection fraction (HCC)  I50.32 Pro b natriuretic peptide (BNP)    Basic metabolic panel    Magnesium    2. Dyspnea on exertion  R06.00 sacubitril-valsartan (ENTRESTO) 49-51 MG    Pro b natriuretic peptide (BNP)    Basic metabolic panel    Magnesium    3. Nonobstructive atherosclerosis of coronary artery  I25.10  aspirin EC 81 MG tablet    4. Type 2 diabetes mellitus with hyperglycemia, with long-term current use of insulin (HCC)  E11.65    Z79.4     5. Long-term insulin use (HCC)  Z79.4     6. History of COVID-19  Z86.16     7. Mixed hyperlipidemia  E78.2     8. Benign hypertension  I10     9. LBBB (left bundle branch block)  I44.7     10. Class 2 severe obesity due to excess calories with serious comorbidity and body mass index (BMI) of 38.0 to 38.9 in adult [facility])  E66.01    Z68.38        RECOMMENDATIONS: Connie Swanson is a 80 y.o. female whose past medical history and cardiac risk factors include: Single-vessel CAD (nonobstructive), insulin-dependent diabetes mellitus  type 2, chronic kidney disease, hypertension, history of ischemic colitis, hypothyroidism, obesity due to excess calories, vertigo, history of COVID-19 infection (January 2022), postmenopausal female, advanced age.  Chronic heart failure with preserved EF, NYHA class III: Patient continues to have dyspnea with effort related activities. Even though diastolic dysfunction was not commented on due to mitral annular calcification I suspect that she does have elevated filling pressures and degree of diastolic dysfunction. Echocardiogram results reviewed with her extensively at today's visit.  Discontinue losartan and Lasix. Will start Entresto 49/51 mg p.o. twice daily.  Samples for 2 weeks provided.  Patient is asked to get blood work done in 1 week to evaluate kidney function and electrolytes.  If labs are stable patient is asked to pick up her prescription for [facility] for now. Will continue to uptitrate GDMT as hemodynamics and laboratory values allow.  Dyspnea on exertion: Chronic but progressive. VQ scan low probability for PE. Echocardiogram did not illustrate any significant valvular heart disease.   Nuclear stress test reported to be overall high risk study due to low LVEF and global hypokinesis.  Independently reviewed  SPECT images which did not illustrate reversible defect but she does have fixed perfusion defect involving the inferior and inferoseptal segments which could be secondary to either prior infarction and/or soft tissue/diaphragmatic attenuation artifact. Given her renal insufficiency I would recommend uptitrating guideline directed medical therapy as there is no obvious reversible ischemia noted on a recent stress test.  However, if she continues to have dyspnea on exertion despite up titration of GDMT could consider invasive angiography if patient and family agree because balanced ischemia cannot be ruled out. Given the complex medical decision making I called the patient's daughter [name] over the phone and spoke to her extensively.  Both the patient and daughter are agreeable with the plan of care and she will be monitored closely.  She is asked to seek medical attention if her symptoms of shortness of breath increase in intensity frequency or duration by going to the closest ER via EMS and same applies if she were to develop chest pain.  Nonobstructive CAD: Noted to have single-vessel CAD back in 2012 as per the left heart catheterization. Nuclear stress test recommended to evaluate for reversible ischemia.  Results reviewed please see above. Continue aspirin and statin therapy.   Educated on the importance of glycemic control.  Benign essential hypertension: Blood pressure within acceptable range. Medications titrated as noted above. She is asked to keep a log of her blood pressures and to bring it in at the next office visit with either myself or her PCP. Currently managed by primary care provider.  Insulin-dependent diabetes mellitus type 2: Most recent hemoglobin A1c within acceptable range. Educated on the importance of glycemic control. Currently managed by primary care provider.  Obesity, due to excess calories: Body mass index is 38.15 kg/m. I reviewed with the patient the importance  of diet, regular physical activity/exercise, weight loss.   Patient is educated on increasing physical activity gradually as tolerated.  With the goal of moderate intensity exercise for 30 minutes a day 5 days a week.  FINAL MEDICATION LIST END OF ENCOUNTER: Meds ordered this encounter  Medications   sacubitril-valsartan (ENTRESTO) 49-51 MG    Sig: Take 1 tablet by mouth 2 (two) times daily.    Dispense:  180 tablet    Refill:  0   aspirin EC 81 MG tablet    Sig: Take 1 tablet (81 mg total) by mouth daily.  Swallow whole.    Dispense:  90 tablet    Refill:  0    Medications Discontinued During This Encounter  Medication Reason   aspirin EC 81 MG tablet Error   losartan (COZAAR) 25 MG tablet Discontinued by provider   furosemide (LASIX) 20 MG tablet Discontinued by provider     Current Outpatient Medications:    allopurinol (ZYLOPRIM) 300 MG tablet, Take 300 mg by mouth daily., Disp: , Rfl:    atorvastatin (LIPITOR) 40 MG tablet, , Disp: , Rfl:    B-D INS SYR ULTRAFINE 1CC/30G 30G X 1/2" 1 ML MISC, USE SYRINGE AS DIRECTED TWICE DAILY, Disp: , Rfl:    carvedilol (COREG) 25 MG tablet, Take 25 mg by mouth 2 (two) times daily., Disp: , Rfl:    metFORMIN (GLUCOPHAGE) 1000 MG tablet, , Disp: , Rfl:    NOVOLIN 70/30 RELION (70-30) 100 UNIT/ML injection, SMARTSIG:60 Unit(s) SUB-Q Twice Daily, Disp: , Rfl:    sacubitril-valsartan (ENTRESTO) 49-51 MG, Take 1 tablet by mouth 2 (two) times daily., Disp: 180 tablet, Rfl: 0   aspirin EC 81 MG tablet, Take 1 tablet (81 mg total) by mouth daily. Swallow whole., Disp: 90 tablet, Rfl: 0  Orders Placed This Encounter  Procedures   Pro b natriuretic peptide (BNP)   Basic metabolic panel   Magnesium    There are no Patient Instructions on file for this visit.   --Continue cardiac medications as reconciled in final medication list. --Return in about 4 weeks (around 07/15/2020) for Follow up, heart failure management.. Or sooner if needed. --Continue  follow-up with your primary care physician regarding the management of your other chronic comorbid conditions.  Patient's questions and concerns were addressed to her satisfaction. She voices understanding of the instructions provided during this encounter.   This note was created using a voice recognition software as a result there may be grammatical errors inadvertently enclosed that do not reflect the nature of this encounter. Every attempt is made to correct such errors.  Total time spent: 63 minutes discussing her symptoms, reviewing the results of the echocardiogram/nuclear stress test/VQ scan.  Given her symptoms and high risk nuclear stress test in the setting of renal insufficiency complex decision making was needed to be made during this encounter.  I also called the patient's daughter back to review these findings and recommendations who was in agreement.  I will see the patient in close follow-up and she is asked to seek medical attention if there is change in clinical status.  Rex Kras, [place], [facility]  Pager: [phone] Office: [phone]

## 2020-06-20 ENCOUNTER — Other Ambulatory Visit: Payer: Self-pay

## 2020-06-20 DIAGNOSIS — R0600 Dyspnea, unspecified: Secondary | ICD-10-CM

## 2020-06-20 DIAGNOSIS — R0609 Other forms of dyspnea: Secondary | ICD-10-CM

## 2020-06-20 DIAGNOSIS — I5032 Chronic diastolic (congestive) heart failure: Secondary | ICD-10-CM

## 2020-06-24 DIAGNOSIS — R0600 Dyspnea, unspecified: Secondary | ICD-10-CM | POA: Diagnosis not present

## 2020-06-24 DIAGNOSIS — I5032 Chronic diastolic (congestive) heart failure: Secondary | ICD-10-CM | POA: Diagnosis not present

## 2020-06-25 LAB — BASIC METABOLIC PANEL
BUN/Creatinine Ratio: 14 (ref 12–28)
BUN: 24 mg/dL (ref 8–27)
CO2: 24 mmol/L (ref 20–29)
Calcium: 9.3 mg/dL (ref 8.7–10.3)
Chloride: 105 mmol/L (ref 96–106)
Creatinine, Ser: 1.72 mg/dL — ABNORMAL HIGH (ref 0.57–1.00)
Glucose: 113 mg/dL — ABNORMAL HIGH (ref 65–99)
Potassium: 5.3 mmol/L — ABNORMAL HIGH (ref 3.5–5.2)
Sodium: 143 mmol/L (ref 134–144)
eGFR: 29 mL/min/{1.73_m2} — ABNORMAL LOW (ref >59)

## 2020-06-25 LAB — MAGNESIUM: Magnesium: 2 mg/dL (ref 1.6–2.3)

## 2020-06-25 LAB — PRO B NATRIURETIC PEPTIDE: NT-Pro BNP: 641 pg/mL (ref 0–738)

## 2020-06-27 NOTE — Progress Notes (Signed)
Called pt to inform her about her lab results, and to bring meds. Stop eating potassium foods. Pt understood

## 2020-06-30 ENCOUNTER — Other Ambulatory Visit: Payer: Self-pay

## 2020-06-30 DIAGNOSIS — R0609 Other forms of dyspnea: Secondary | ICD-10-CM

## 2020-06-30 DIAGNOSIS — R0600 Dyspnea, unspecified: Secondary | ICD-10-CM

## 2020-06-30 MED ORDER — ENTRESTO 49-51 MG PO TABS: 1.0000 | ORAL_TABLET | Freq: Two times a day (BID) | ORAL | 1 refills | Status: AC

## 2020-07-07 DIAGNOSIS — E1122 Type 2 diabetes mellitus with diabetic chronic kidney disease: Secondary | ICD-10-CM | POA: Diagnosis not present

## 2020-07-07 DIAGNOSIS — I130 Hypertensive heart and chronic kidney disease with heart failure and stage 1 through stage 4 chronic kidney disease, or unspecified chronic kidney disease: Secondary | ICD-10-CM | POA: Diagnosis not present

## 2020-07-07 DIAGNOSIS — N184 Chronic kidney disease, stage 4 (severe): Secondary | ICD-10-CM | POA: Diagnosis not present

## 2020-07-07 DIAGNOSIS — I509 Heart failure, unspecified: Secondary | ICD-10-CM | POA: Diagnosis not present

## 2020-07-15 ENCOUNTER — Encounter: Payer: Self-pay | Admitting: Cardiology

## 2020-07-15 ENCOUNTER — Ambulatory Visit: Payer: Medicare Other | Admitting: Cardiology

## 2020-07-15 ENCOUNTER — Other Ambulatory Visit: Payer: Self-pay

## 2020-07-15 VITALS — BP 160/82 | HR 68 | Temp 97.5°F | Resp 16 | Ht 62.0 in | Wt 214.6 lb

## 2020-07-15 DIAGNOSIS — Z6839 Body mass index (BMI) 39.0-39.9, adult: Secondary | ICD-10-CM

## 2020-07-15 DIAGNOSIS — E782 Mixed hyperlipidemia: Secondary | ICD-10-CM

## 2020-07-15 DIAGNOSIS — E1165 Type 2 diabetes mellitus with hyperglycemia: Secondary | ICD-10-CM | POA: Diagnosis not present

## 2020-07-15 DIAGNOSIS — Z8616 Personal history of COVID-19: Secondary | ICD-10-CM

## 2020-07-15 DIAGNOSIS — R0609 Other forms of dyspnea: Secondary | ICD-10-CM | POA: Diagnosis not present

## 2020-07-15 DIAGNOSIS — I447 Left bundle-branch block, unspecified: Secondary | ICD-10-CM

## 2020-07-15 DIAGNOSIS — R0600 Dyspnea, unspecified: Secondary | ICD-10-CM

## 2020-07-15 DIAGNOSIS — I10 Essential (primary) hypertension: Secondary | ICD-10-CM

## 2020-07-15 DIAGNOSIS — I5032 Chronic diastolic (congestive) heart failure: Secondary | ICD-10-CM

## 2020-07-15 DIAGNOSIS — I2510 Atherosclerotic heart disease of native coronary artery without angina pectoris: Secondary | ICD-10-CM | POA: Diagnosis not present

## 2020-07-15 DIAGNOSIS — Z794 Long term (current) use of insulin: Secondary | ICD-10-CM

## 2020-07-15 MED ORDER — ISOSORB DINITRATE-HYDRALAZINE 20-37.5 MG PO TABS: 1.0000 | ORAL_TABLET | Freq: Three times a day (TID) | ORAL | 0 refills | Status: DC

## 2020-07-15 NOTE — Progress Notes (Signed)
Date:  07/15/2020   ID:  Connie Swanson, DOB 1940-07-09, MRN [id number]  PCP:  Connie Pao, MD  Cardiologist:  Connie Kras, DO, [facility] (established care 04/27/2020) Former Cardiology Providers: Dr. Adrian Swanson   Date: 07/15/20 Last Office Visit: 06/17/2020  Chief Complaint  Patient presents with   Chronic heart failure with preserved ejection fraction    Follow-up    HPI  Connie Swanson is a 80 y.o. female who presents to the office with a chief complaint of " heart failure management." Patient's past medical history and cardiovascular risk factors include: HFpEF, single-vessel CAD (nonobstructive), insulin-dependent diabetes mellitus type 2, chronic kidney disease, hypertension, history of ischemic colitis, hypothyroidism, obesity due to excess calories, vertigo, history of COVID-19 infection (January 2022), postmenopausal female, advanced age.  She is referred to the office at the request of Swanson, Connie Him, MD for evaluation of heart failure.  During initial consultation patient noted that she was experiencing dyspnea on exertion for several months prior to seeking medical attention.  Her symptoms are progressive to the point that she would get dyspneic by walking from the living room to her bedroom.  Subsequently checked her D-dimers which were elevated and due to renal insufficiency she underwent a VQ scan which noted low probability for pulmonary embolism.  In April her NT-pro BNP was 1000 and recent echo noted elevated LAP. Echo did not comment on diastolic dysfunction due to mitral annular calcification. Clinically, she has heart failure with preserved EF given her symptoms, laboratory findings, and hemodynamics noted on echocardiogram.  Patient's medications have been uptitrated clinically is improving.  Most recent NT proBNP 641.   In the past, she has underwent nuclear stress test which was reported to be high risk due to reduced LVEF per gated SPECT and has fixed perfusion defect  due to attenuation artifact. However, the LVEF per echo is preserved and given her renal function shared decision was to treat medically.   She presents for follow up patient states that her shortness of breath is relatively stable or at times worse when she overexerts herself.  Since that she has been transitioned from ARB/Lasix to [facility] her blood pressures have been trending high at home.  Patient states that her home blood pressures could range from 140-170 mmHg.   Patient denies chest pain at rest or with effort related activities.  FUNCTIONAL STATUS: No structured exercise program or daily routine.   ALLERGIES: No Known Allergies  MEDICATION LIST PRIOR TO VISIT: Current Meds  Medication Sig   allopurinol (ZYLOPRIM) 300 MG tablet Take 300 mg by mouth daily.   atorvastatin (LIPITOR) 40 MG tablet Take 40 mg by mouth daily.   B-D INS SYR ULTRAFINE 1CC/30G 30G X 1/2" 1 ML MISC USE SYRINGE AS DIRECTED TWICE DAILY   carvedilol (COREG) 25 MG tablet Take 25 mg by mouth 2 (two) times daily.   isosorbide-hydrALAZINE (BIDIL) 20-37.5 MG tablet Take 1 tablet by mouth 3 (three) times daily.   metFORMIN (GLUCOPHAGE) 1000 MG tablet Take 1,000 mg by mouth 2 (two) times daily with a meal.   NOVOLIN 70/30 RELION (70-30) 100 UNIT/ML injection SMARTSIG:60 Unit(s) SUB-Q Twice Daily   sacubitril-valsartan (ENTRESTO) 49-51 MG Take 1 tablet by mouth 2 (two) times daily.     PAST MEDICAL HISTORY: Past Medical History:  Diagnosis Date   CHF (congestive heart failure) ([place])    Chronic kidney disease    Coronary artery disease    Diabetes mellitus without complication ([place])  Hyperlipidemia    Hypertension     PAST SURGICAL HISTORY: Past Surgical History:  Procedure Laterality Date   BREAST EXCISIONAL BIOPSY Right    HEMORRHOID SURGERY     MENISCUS REPAIR     THYROID SURGERY     VAGINAL HYSTERECTOMY      FAMILY HISTORY: The patient family history is not on file. She was adopted.  SOCIAL  HISTORY:  The patient  reports that she has never smoked. She has never used smokeless tobacco. She reports that she does not drink alcohol and does not use drugs.  REVIEW OF SYSTEMS: Review of Systems  Constitutional: Positive for malaise/fatigue. Negative for chills and fever.  HENT:  Negative for hoarse voice and nosebleeds.   Eyes:  Negative for discharge, double vision and pain.  Cardiovascular:  Positive for dyspnea on exertion. Negative for chest pain, claudication, leg swelling, near-syncope, orthopnea, palpitations, paroxysmal nocturnal dyspnea and syncope.  Respiratory:  Positive for shortness of breath. Negative for hemoptysis.   Musculoskeletal:  Negative for muscle cramps and myalgias.  Gastrointestinal:  Negative for abdominal pain, constipation, diarrhea, hematemesis, hematochezia, melena, nausea and vomiting.  Neurological:  Negative for dizziness and light-headedness.   PHYSICAL EXAM: Vitals with BMI 07/15/2020 07/15/2020 06/17/2020  Height - '5\' 2"'$  -  Weight - 214 lbs 10 oz -  BMI - Q000111Q -  Systolic 0000000 0000000 [id number]  Diastolic 82 77 72  Pulse 68 68 69    CONSTITUTIONAL: Age-appropriate female, hemodynamically stable, no acute distress.    SKIN: Skin is warm and dry. No rash noted. No cyanosis. No pallor. No jaundice HEAD: Normocephalic and atraumatic.  EYES: No scleral icterus MOUTH/THROAT: Moist oral membranes.  NECK: Short neck, increased adipose tissue, no JVD present. No thyromegaly noted. No carotid bruits  LYMPHATIC: No visible cervical adenopathy.  CHEST Normal respiratory effort. No intercostal retractions  LUNGS: Clear to auscultation bilaterally.  No stridor. No wheezes. No rales.  CARDIOVASCULAR: Regular rate and rhythm, positive Q000111Q, soft holosystolic murmur heard at the apex, no gallops or rubs ABDOMINAL: Obese, nontender, nondistended, positive bowel sounds in all 4 quadrants, no apparent ascites.  EXTREMITIES: No peripheral edema,  2+ bilateral posterior  tibial pulses. HEMATOLOGIC: No significant bruising NEUROLOGIC: Oriented to person, place, and time. Nonfocal. Normal muscle tone.  PSYCHIATRIC: Normal mood and affect. Normal behavior. Cooperative  RADIOLOGY:  VQ Scan:  04/28/2020: Negative perfusion lung scan for pulmonary embolism.  CARDIAC DATABASE: EKG: 04/27/2020: Normal sinus rhythm, 76 bpm, left axis, left bundle branch block.   Echocardiogram: 05/04/2020: Normal LV systolic function with visual EF 50-55%. Left ventricle cavity is normal in size. Severe concentric hypertrophy of the left ventricle. Normal global wall motion. Unable to evaluate diastolic function due to mitral annular calcification. Elevated LAP.  Native mitral valve. Posterior annular calcification. No evidence of mitral stenosis. Mild (Grade I) mitral regurgitation. Mild tricuspid regurgitation. No evidence of pulmonary hypertension. RVSP measures 32 mmHg. No prior studies available for comparison.   Stress Testing: Lexiscan Tetrofosmin stress test 05/11/2020: Lexiscan nuclear stress test performed using 1-day protocol. SPECT images show uniform breast tissue attenuation in both rest and stress images. In addition, there is medium sized, medium intensity, fixed perfusion defect in basal inferior/inferoseptal myocardium. All segments of left ventricle demonstrated septal dyskinesis, global hypokinesis. Stress LVEF 34%. High risk study due to low stress LVEF.  Heart Catheterization: 10/10/2010: HEMODYNAMIC DATA:  Right heart catheterization:  RA pressure 7/7, mean 6 mmHg.  RA saturation 76%.   RV pressure 24/1,  end-diastolic pressure 4 mmHg.   PA pressure 27/40 with a mean of 21 mmHg.  PA saturation was 75%.   Pulmonary capillary wedge 6/5 with a mean of 3 mmHg.  Aortic saturation was 100%.   Cardiac output was 6.4 with a cardiac index of 3.34 by Fick.   Right coronary artery:  Right coronary artery is a large caliber and superdominant vessel.  Gives  origin to large PL branch and 2 PDA large branches.  Smooth and normal.   Left main coronary artery:  Left main coronary artery is a large-caliber vessel, which is smooth and normal.   Circumflex coronary artery:  Circumflex coronary artery is a very large- caliber vessel.  Gives origin to a moderate-sized obtuse marginal 1 and continues distally as a large obtuse marginal 2.  Smooth and normal.   LAD:  LAD is a large-caliber vessel in the proximal segment.  Gives origin to a moderate-sized diagonal 1 and then gives origin to a very large diagonal 2, which is bigger than the LAD itself.  Between diagonal 1 and diagonal 2, there is a eccentric 50% stenosis noted in the LAD. The diagonal 2, which is very large has a ostial 30% stenosis. Otherwise, except for this focal stenosis of 50%, there is no other lesion noted in the LAD or the large diagonal 2.  LABORATORY DATA: No flowsheet data found.  CMP Latest Ref Rng & Units 06/24/2020 05/25/2020 04/27/2020  Glucose 65 - 99 mg/dL 113(H) 124(H) 185(H)  BUN 8 - 27 mg/dL 24 20 28(H)  Creatinine 0.57 - 1.00 mg/dL 1.72(H) 1.55(H) 1.87(H)  Sodium 134 - 144 mmol/L 143 138 143  Potassium 3.5 - 5.2 mmol/L 5.3(H) 4.8 5.0  Chloride 96 - 106 mmol/L 105 99 99  CO2 20 - 29 mmol/L '24 25 23  '$ Calcium 8.7 - 10.3 mg/dL 9.3 9.6 9.3    Lipid Panel  No results found for: CHOL, TRIG, HDL, CHOLHDL, VLDL, LDLCALC, LDLDIRECT, LABVLDL  No components found for: NTPROBNP Recent Labs    04/27/20 1358 06/24/20 1152  PROBNP 1,000* 641   No results for input(s): TSH in the last 8760 hours.  BMP Recent Labs    04/27/20 1358 05/25/20 1118 06/24/20 1151  NA 143 138 143  K 5.0 4.8 5.3*  CL 99 99 105  CO2 '23 25 24  '$ GLUCOSE 185* 124* 113*  BUN 28* 20 24  CREATININE 1.87* 1.55* 1.72*  CALCIUM 9.3 9.6 9.3    HEMOGLOBIN A1C No results found for: HGBA1C, MPG  External labs: 09/07/2019:  Total cholesterol 147, triglycerides 186, HDL 27, LDL 83, non-HDL  120 TSH 2.15 Hemoglobin A1c 6.5 White count 11.75, hemoglobin 11.1 g/dL, hematocrit 34.3%  IMPRESSION:    ICD-10-CM   1. Chronic heart failure with preserved ejection fraction (HCC)  I50.32 isosorbide-hydrALAZINE (BIDIL) 20-37.5 MG tablet    Pro b natriuretic peptide (BNP)    Basic metabolic panel    Magnesium    2. Dyspnea on exertion  R06.00 isosorbide-hydrALAZINE (BIDIL) 20-37.5 MG tablet    3. Nonobstructive atherosclerosis of coronary artery  I25.10     4. Type 2 diabetes mellitus with hyperglycemia, with long-term current use of insulin (HCC)  E11.65    Z79.4     5. Long-term insulin use (HCC)  Z79.4     6. History of COVID-19  Z86.16     7. Mixed hyperlipidemia  E78.2     8. Benign hypertension  I10 isosorbide-hydrALAZINE (BIDIL) 20-37.5 MG tablet  Pro b natriuretic peptide (BNP)    Basic metabolic panel    Magnesium    9. LBBB (left bundle branch block)  I44.7     10. Class 2 severe obesity due to excess calories with serious comorbidity and body mass index (BMI) of 39.0 to 39.9 in adult [facility])  E66.01    Z68.39        RECOMMENDATIONS: Connie Swanson is a 80 y.o. female whose past medical history and cardiac risk factors include: Single-vessel CAD (nonobstructive), insulin-dependent diabetes mellitus type 2, chronic kidney disease, hypertension, history of ischemic colitis, hypothyroidism, obesity due to excess calories, vertigo, history of COVID-19 infection (January 2022), postmenopausal female, advanced age.  Chronic heart failure with preserved EF, NYHA class III: Patient continues to have dyspnea with effort related activities. Medications reconciled. Currently on Entresto 49/51 mg p.o. twice daily, Carvedilol 25 mg p.o. twice daily. Patient's blood pressure remains elevated at home since the transition from losartan to [facility]. Labs from 06/24/2020 independently reviewed. Given her renal function will start BiDil to help improve her blood pressures.  Repeat  blood work in 1 week.  If stable consider the addition of [place]. Patient states that the Delene Loll was expensive for her will apply for patient assistance. Will continue to uptitrate GDMT as hemodynamics and laboratory values allow.  Dyspnea on exertion: Chronic but progressive. VQ scan low probability for PE. Echocardiogram did not illustrate any significant valvular heart disease.   Nuclear stress test reported to be high risk study due to low LVEF and global hypokinesis.  Independently reviewed SPECT images which did not illustrate reversible defect but she does have fixed perfusion defect involving the inferior and inferoseptal segments which could be secondary to either prior infarction and/or soft tissue/diaphragmatic attenuation artifact. Given her renal insufficiency shared decision was to uptitrate guideline directed medical therapy as there is no obvious reversible ischemia noted on a recent stress test.  However, if she continues to have dyspnea on exertion despite up titration of GDMT could consider invasive angiography if patient and family agree because balanced ischemia cannot be ruled out. This was discussed with both the patient and her daughter [name] over the phone. She is asked to seek medical attention if her symptoms of shortness of breath increase in intensity frequency or duration by going to the closest ER via EMS and same applies if she were to develop chest pain.  Nonobstructive CAD: Noted to have single-vessel CAD back in 2012 as per the left heart catheterization. See above Continue aspirin and statin therapy.   Educated on the importance of glycemic control.  Benign essential hypertension: Blood pressure not within acceptable range. Medications titrated as noted above. Home blood pressures range between 140-170 mmHg. Will add BiDil. Currently managed by primary care provider.  Insulin-dependent diabetes mellitus type 2: Most recent hemoglobin A1c within  acceptable range. Educated on the importance of glycemic control. Currently managed by primary care provider.  Obesity, due to excess calories: Body mass index is 39.25 kg/m. Gained 8 pounds over the last 1 month. I reviewed with the patient the importance of diet, regular physical activity/exercise, weight loss.   Patient is educated on increasing physical activity gradually as tolerated.  With the goal of moderate intensity exercise for 30 minutes a day 5 days a week.  FINAL MEDICATION LIST END OF ENCOUNTER: Meds ordered this encounter  Medications   isosorbide-hydrALAZINE (BIDIL) 20-37.5 MG tablet    Sig: Take 1 tablet by mouth 3 (three) times daily.  Dispense:  270 tablet    Refill:  0     Medications Discontinued During This Encounter  Medication Reason   aspirin EC 81 MG tablet Error     Current Outpatient Medications:    allopurinol (ZYLOPRIM) 300 MG tablet, Take 300 mg by mouth daily., Disp: , Rfl:    atorvastatin (LIPITOR) 40 MG tablet, Take 40 mg by mouth daily., Disp: , Rfl:    B-D INS SYR ULTRAFINE 1CC/30G 30G X 1/2" 1 ML MISC, USE SYRINGE AS DIRECTED TWICE DAILY, Disp: , Rfl:    carvedilol (COREG) 25 MG tablet, Take 25 mg by mouth 2 (two) times daily., Disp: , Rfl:    isosorbide-hydrALAZINE (BIDIL) 20-37.5 MG tablet, Take 1 tablet by mouth 3 (three) times daily., Disp: 270 tablet, Rfl: 0   metFORMIN (GLUCOPHAGE) 1000 MG tablet, Take 1,000 mg by mouth 2 (two) times daily with a meal., Disp: , Rfl:    NOVOLIN 70/30 RELION (70-30) 100 UNIT/ML injection, SMARTSIG:60 Unit(s) SUB-Q Twice Daily, Disp: , Rfl:    sacubitril-valsartan (ENTRESTO) 49-51 MG, Take 1 tablet by mouth 2 (two) times daily., Disp: 180 tablet, Rfl: 1  Orders Placed This Encounter  Procedures   Pro b natriuretic peptide (BNP)   Basic metabolic panel   Magnesium   There are no Patient Instructions on file for this visit.   --Continue cardiac medications as reconciled in final medication  list. --Return in about 4 weeks (around 08/12/2020) for Follow up, heart failure management., BP. Or sooner if needed. --Continue follow-up with your primary care physician regarding the management of your other chronic comorbid conditions.  Patient's questions and concerns were addressed to her satisfaction. She voices understanding of the instructions provided during this encounter.   This note was created using a voice recognition software as a result there may be grammatical errors inadvertently enclosed that do not reflect the nature of this encounter. Every attempt is made to correct such errors.   Connie Swanson, [place], [facility]  Pager: [phone] Office: [phone]

## 2020-07-18 ENCOUNTER — Other Ambulatory Visit: Payer: Self-pay

## 2020-07-18 DIAGNOSIS — I10 Essential (primary) hypertension: Secondary | ICD-10-CM

## 2020-07-18 DIAGNOSIS — I5032 Chronic diastolic (congestive) heart failure: Secondary | ICD-10-CM

## 2020-07-21 ENCOUNTER — Encounter: Payer: Self-pay | Admitting: Cardiology

## 2020-07-22 ENCOUNTER — Telehealth: Payer: Self-pay

## 2020-07-22 ENCOUNTER — Other Ambulatory Visit: Payer: Self-pay | Admitting: Pharmacist

## 2020-07-22 DIAGNOSIS — I5032 Chronic diastolic (congestive) heart failure: Secondary | ICD-10-CM

## 2020-07-22 DIAGNOSIS — I10 Essential (primary) hypertension: Secondary | ICD-10-CM

## 2020-07-22 MED ORDER — ISOSORBIDE MONONITRATE ER 30 MG PO TB24: 30.0000 mg | ORAL_TABLET | Freq: Every day | ORAL | 1 refills | Status: DC

## 2020-07-22 MED ORDER — HYDRALAZINE HCL 50 MG PO TABS: 50.0000 mg | ORAL_TABLET | Freq: Three times a day (TID) | ORAL | 1 refills | Status: DC

## 2020-07-22 NOTE — Progress Notes (Signed)
BiDil non-formulary per pt's prescription insurance. Completed formulary exemption [place] and was approved on 07/21/20. Called pt's pharmacy to inquire about cost for the BiDil with insurance approval, but pt still has ~$200 copay for BiDil. Reviewed with Dr. Terri Skains. Per Dr. Terri Skains, okay to split to hydralazine 25 mg TID and Isosorbide mononitrate 30 mg Qday for cost reasons. Provided pt with Medicare ExtraHelp phone number and pt will complete the application telephonically to help with future prescription coverage assistance. Entresto PAP completed and faxed. Will follow up regarding status in 1 week.   Home BP readings continues to be elevated. Last night pt's SBP ~180. Pt continues to have SOB and DOE. Denies any complains of HA, CP, neurofocal changes. Pt agreeable to start hydralazine and isosorbide and continue monitor home BP readings. Pt agreeable to complete lab work in 1 week to see if Scr and K levels improve in order to further titrate GDMT. Pt reports to be drinking ~2-3 L water a day and denies any intake of potassium supplements in the meantime.

## 2020-07-25 NOTE — Telephone Encounter (Signed)
Error

## 2020-08-01 DIAGNOSIS — I10 Essential (primary) hypertension: Secondary | ICD-10-CM | POA: Diagnosis not present

## 2020-08-01 DIAGNOSIS — I5032 Chronic diastolic (congestive) heart failure: Secondary | ICD-10-CM | POA: Diagnosis not present

## 2020-08-02 ENCOUNTER — Other Ambulatory Visit: Payer: Self-pay | Admitting: Pharmacist

## 2020-08-02 DIAGNOSIS — I5032 Chronic diastolic (congestive) heart failure: Secondary | ICD-10-CM

## 2020-08-02 LAB — BASIC METABOLIC PANEL
BUN/Creatinine Ratio: 17 (ref 12–28)
BUN: 24 mg/dL (ref 8–27)
CO2: 26 mmol/L (ref 20–29)
Calcium: 9.7 mg/dL (ref 8.7–10.3)
Chloride: 103 mmol/L (ref 96–106)
Creatinine, Ser: 1.43 mg/dL — ABNORMAL HIGH (ref 0.57–1.00)
Glucose: 80 mg/dL (ref 65–99)
Potassium: 4.9 mmol/L (ref 3.5–5.2)
Sodium: 142 mmol/L (ref 134–144)
eGFR: 37 mL/min/{1.73_m2} — ABNORMAL LOW (ref >59)

## 2020-08-02 LAB — PRO B NATRIURETIC PEPTIDE: NT-Pro BNP: 2678 pg/mL — ABNORMAL HIGH (ref 0–738)

## 2020-08-02 LAB — MAGNESIUM: Magnesium: 1.9 mg/dL (ref 1.6–2.3)

## 2020-08-02 MED ORDER — BUMETANIDE 0.5 MG PO TABS: 0.5000 mg | ORAL_TABLET | Freq: Every day | ORAL | 1 refills | Status: DC

## 2020-08-02 NOTE — Progress Notes (Signed)
Reviewed lab results with pt. Significant increase in NT-Pro BNP compared to 1 month prior. Pt reports having lower extremity swelling, SOB, DOE. Attests to recent weight gain. Recently started on isosorbide and hydralazine and seen slight improvement in home BP readings. BP yesterday of 165/83. Improved from readings in 170s-180s previously. Reviewed with Dr. Terri Skains. Recommended starting Bumex to help with lower extremity swelling. Rx pended. Recent Entresto PAP approved. May consider increasing Entresto dose to 97/103 mg BID at next OV on 08/16/20.

## 2020-08-03 NOTE — Addendum Note (Signed)
Addended by: Oran Rein on: 08/03/2020 03:47 PM   Modules accepted: Orders

## 2020-08-07 DIAGNOSIS — N184 Chronic kidney disease, stage 4 (severe): Secondary | ICD-10-CM | POA: Diagnosis not present

## 2020-08-07 DIAGNOSIS — E1122 Type 2 diabetes mellitus with diabetic chronic kidney disease: Secondary | ICD-10-CM | POA: Diagnosis not present

## 2020-08-07 DIAGNOSIS — I130 Hypertensive heart and chronic kidney disease with heart failure and stage 1 through stage 4 chronic kidney disease, or unspecified chronic kidney disease: Secondary | ICD-10-CM | POA: Diagnosis not present

## 2020-08-07 DIAGNOSIS — I509 Heart failure, unspecified: Secondary | ICD-10-CM | POA: Diagnosis not present

## 2020-08-08 ENCOUNTER — Telehealth: Payer: Self-pay | Admitting: Pharmacist

## 2020-08-08 NOTE — Telephone Encounter (Signed)
Called pt to follow up response to Bumex that pt started last Thursday. Pt reports that she has lost 3 lbs over the last few days. BP readings improved since as well. Wt this morning of 210 lbs (3 lbs wt loss), BP: 123/60. Pt reports that her breathing has improved and her lower extremity swelling has improved significantly since starting Bumex. Pt verbalized completing follow up labs this Thursday. Pt has an OV with Dr. Terri Skains scheduled for 08/16/20

## 2020-08-10 ENCOUNTER — Ambulatory Visit: Payer: Medicare Other | Admitting: Podiatry

## 2020-08-11 DIAGNOSIS — I5032 Chronic diastolic (congestive) heart failure: Secondary | ICD-10-CM | POA: Diagnosis not present

## 2020-08-12 LAB — PRO B NATRIURETIC PEPTIDE: NT-Pro BNP: 1311 pg/mL — ABNORMAL HIGH (ref 0–738)

## 2020-08-15 LAB — BASIC METABOLIC PANEL
BUN/Creatinine Ratio: 16 (ref 12–28)
BUN: 27 mg/dL (ref 8–27)
CO2: 22 mmol/L (ref 20–29)
Calcium: 9.2 mg/dL (ref 8.7–10.3)
Chloride: 103 mmol/L (ref 96–106)
Creatinine, Ser: 1.71 mg/dL — ABNORMAL HIGH (ref 0.57–1.00)
Glucose: 98 mg/dL (ref 65–99)
Potassium: 4.8 mmol/L (ref 3.5–5.2)
Sodium: 141 mmol/L (ref 134–144)
eGFR: 30 mL/min/{1.73_m2} — ABNORMAL LOW (ref >59)

## 2020-08-16 ENCOUNTER — Other Ambulatory Visit: Payer: Self-pay

## 2020-08-16 ENCOUNTER — Encounter: Payer: Self-pay | Admitting: Cardiology

## 2020-08-16 ENCOUNTER — Ambulatory Visit: Payer: Medicare Other | Admitting: Cardiology

## 2020-08-16 VITALS — BP 138/65 | HR 82 | Temp 97.9°F | Resp 16 | Ht 62.0 in | Wt 212.2 lb

## 2020-08-16 DIAGNOSIS — E782 Mixed hyperlipidemia: Secondary | ICD-10-CM

## 2020-08-16 DIAGNOSIS — I2510 Atherosclerotic heart disease of native coronary artery without angina pectoris: Secondary | ICD-10-CM | POA: Diagnosis not present

## 2020-08-16 DIAGNOSIS — I5032 Chronic diastolic (congestive) heart failure: Secondary | ICD-10-CM

## 2020-08-16 DIAGNOSIS — R0609 Other forms of dyspnea: Secondary | ICD-10-CM | POA: Diagnosis not present

## 2020-08-16 DIAGNOSIS — I447 Left bundle-branch block, unspecified: Secondary | ICD-10-CM

## 2020-08-16 DIAGNOSIS — E1165 Type 2 diabetes mellitus with hyperglycemia: Secondary | ICD-10-CM | POA: Diagnosis not present

## 2020-08-16 DIAGNOSIS — R0600 Dyspnea, unspecified: Secondary | ICD-10-CM

## 2020-08-16 DIAGNOSIS — Z6839 Body mass index (BMI) 39.0-39.9, adult: Secondary | ICD-10-CM

## 2020-08-16 DIAGNOSIS — Z8616 Personal history of COVID-19: Secondary | ICD-10-CM

## 2020-08-16 DIAGNOSIS — Z794 Long term (current) use of insulin: Secondary | ICD-10-CM

## 2020-08-16 MED ORDER — BUMETANIDE 0.5 MG PO TABS: 0.5000 mg | ORAL_TABLET | ORAL | 1 refills | Status: DC | PRN

## 2020-08-16 NOTE — Progress Notes (Signed)
Date:  08/16/2020   ID:  [name], DOB [birth year], MRN [id number]  PCP:  Haywood Pao, MD  Cardiologist:  Rex Kras, DO, [facility] (established care 04/27/2020) Former Cardiology Providers: Dr. Adrian Prows   Date: 08/16/20 Last Office Visit: 07/15/2020  Chief Complaint  Patient presents with   Chronic heart failure with preserved ejection fraction    Hypertension   Follow-up   Results    HPI  Connie Swanson is a 80 y.o. female who presents to the office with a chief complaint of " 1 month follow up for heart failure management." Patient's past medical history and cardiovascular risk factors include: HFpEF, single-vessel CAD (nonobstructive), insulin-dependent diabetes mellitus type 2, chronic kidney disease, hypertension, history of ischemic colitis, hypothyroidism, obesity due to excess calories, vertigo, history of COVID-19 infection (January 2022), postmenopausal female, advanced age.  She is referred to the office at the request of [name], [name], MD for evaluation of heart failure.  Patient establish care with myself back in April 2022 for progressive dyspnea on exertion/evaluation for congestive heart failure.  She is undergone extensive evaluation given her underlying dyspnea and based on the most recent ischemic evaluation patient is noted to have HFpEF.  Up titration of GDMT has been a slow process and given her underlying chronic kidney disease.  At the last office visit medications were uptitrated with the initiation of BiDil.  She did do well with isosorbide mononitrate/hydralazine and subsequently was started on Bumex due to increased weight gain, elevated NT proBNP, and shortness of breath.    After being on Bumex for 1 week she had repeat blood work which noted worsening serum creatinine level with improvement in NT proBNP.  Clinically today she appears to be euvolemic, less short of breath, and no lower extremity swelling.  She has lost about 2 pounds since last office  visit as well.  In the past she has had a high risk stress test with due to progressive chronic kidney disease that shared decision was to treated medically until unless her symptoms were to increase in intensity, frequency, duration, or has new onset of chest pain suggestive of angina pectoris.  Patient states that she would like to continue medication titration as she is remained relatively stable since last visit  FUNCTIONAL STATUS: No structured exercise program or daily routine.   ALLERGIES: No Known Allergies  MEDICATION LIST PRIOR TO VISIT: Current Meds  Medication Sig   allopurinol (ZYLOPRIM) 300 MG tablet Take 300 mg by mouth daily.   atorvastatin (LIPITOR) 40 MG tablet Take 40 mg by mouth daily.   B-D INS SYR ULTRAFINE 1CC/30G 30G X 1/2" 1 ML MISC USE SYRINGE AS DIRECTED TWICE DAILY   carvedilol (COREG) 25 MG tablet Take 25 mg by mouth 2 (two) times daily.   hydrALAZINE (APRESOLINE) 50 MG tablet Take 1 tablet (50 mg total) by mouth 3 (three) times daily.   isosorbide mononitrate (IMDUR) 30 MG 24 hr tablet Take 1 tablet (30 mg total) by mouth daily.   metFORMIN (GLUCOPHAGE) 1000 MG tablet Take 1,000 mg by mouth 2 (two) times daily with a meal.   NOVOLIN 70/30 RELION (70-30) 100 UNIT/ML injection SMARTSIG:60 Unit(s) SUB-Q Twice Daily   sacubitril-valsartan (ENTRESTO) 49-51 MG Take 1 tablet by mouth 2 (two) times daily.   [DISCONTINUED] bumetanide (BUMEX) 0.5 MG tablet Take 1 tablet (0.5 mg total) by mouth daily.     PAST MEDICAL HISTORY: Past Medical History:  Diagnosis Date   CHF (congestive  heart failure) ([place])    Chronic kidney disease    Coronary artery disease    Diabetes mellitus without complication ([place])    Hyperlipidemia    Hypertension     PAST SURGICAL HISTORY: Past Surgical History:  Procedure Laterality Date   BREAST EXCISIONAL BIOPSY Right    HEMORRHOID SURGERY     MENISCUS REPAIR     THYROID SURGERY     VAGINAL HYSTERECTOMY      FAMILY  HISTORY: The patient family history is not on file. She was adopted.  SOCIAL HISTORY:  The patient  reports that she has never smoked. She has never used smokeless tobacco. She reports that she does not drink alcohol and does not use drugs.  REVIEW OF SYSTEMS: Review of Systems  Constitutional: Positive for malaise/fatigue (chronic). Negative for chills and fever.  HENT:  Negative for hoarse voice and nosebleeds.   Eyes:  Negative for discharge, double vision and pain.  Cardiovascular:  Positive for dyspnea on exertion. Negative for chest pain, claudication, leg swelling, near-syncope, orthopnea, palpitations, paroxysmal nocturnal dyspnea and syncope.  Respiratory:  Positive for shortness of breath. Negative for hemoptysis.   Musculoskeletal:  Negative for muscle cramps and myalgias.  Gastrointestinal:  Negative for abdominal pain, constipation, diarrhea, hematemesis, hematochezia, melena, nausea and vomiting.  Neurological:  Negative for dizziness and light-headedness.   PHYSICAL EXAM: Vitals with BMI 08/16/2020 07/15/2020 07/15/2020  Height '5\' 2"'$  - '5\' 2"'$   Weight 212 lbs 3 oz - 214 lbs 10 oz  BMI [id number] - Q000111Q  Systolic 0000000 0000000 0000000  Diastolic 65 82 77  Pulse 82 68 68    CONSTITUTIONAL: Age-appropriate female, hemodynamically stable, no acute distress.    SKIN: Skin is warm and dry. No rash noted. No cyanosis. No pallor. No jaundice HEAD: Normocephalic and atraumatic.  EYES: No scleral icterus MOUTH/THROAT: Moist oral membranes.  NECK: Short neck, increased adipose tissue, no JVD present. No thyromegaly noted. No carotid bruits  LYMPHATIC: No visible cervical adenopathy.  CHEST Normal respiratory effort. No intercostal retractions  LUNGS: Clear to auscultation bilaterally.  No stridor. No wheezes. No rales.  CARDIOVASCULAR: Regular rate and rhythm, positive Q000111Q, soft holosystolic murmur heard at the apex, no gallops or rubs ABDOMINAL: Obese, nontender, nondistended, positive bowel  sounds in all 4 quadrants, no apparent ascites.  EXTREMITIES: No peripheral edema,  2+ bilateral posterior tibial pulses. HEMATOLOGIC: No significant bruising NEUROLOGIC: Oriented to person, place, and time. Nonfocal. Normal muscle tone.  PSYCHIATRIC: Normal mood and affect. Normal behavior. Cooperative  RADIOLOGY:  VQ Scan:  04/28/2020: Negative perfusion lung scan for pulmonary embolism.  CARDIAC DATABASE: EKG: 04/27/2020: Normal sinus rhythm, 76 bpm, left axis, left bundle branch block.   Echocardiogram: 05/04/2020: Normal LV systolic function with visual EF 50-55%. Left ventricle cavity is normal in size. Severe concentric hypertrophy of the left ventricle. Normal global wall motion. Unable to evaluate diastolic function due to mitral annular calcification. Elevated LAP.  Native mitral valve. Posterior annular calcification. No evidence of mitral stenosis. Mild (Grade I) mitral regurgitation. Mild tricuspid regurgitation. No evidence of pulmonary hypertension. RVSP measures 32 mmHg. No prior studies available for comparison.   Stress Testing: Lexiscan Tetrofosmin stress test 05/11/2020: Lexiscan nuclear stress test performed using 1-day protocol. SPECT images show uniform breast tissue attenuation in both rest and stress images. In addition, there is medium sized, medium intensity, fixed perfusion defect in basal inferior/inferoseptal myocardium. All segments of left ventricle demonstrated septal dyskinesis, global hypokinesis. Stress LVEF 34%. High risk study  due to low stress LVEF.  Heart Catheterization: 10/10/2010: HEMODYNAMIC DATA:  Right heart catheterization:  RA pressure 7/7, mean 6 mmHg.  RA saturation 76%.   RV pressure 123456, end-diastolic pressure 4 mmHg.   PA pressure 27/40 with a mean of 21 mmHg.  PA saturation was 75%.   Pulmonary capillary wedge 6/5 with a mean of 3 mmHg.  Aortic saturation was 100%.   Cardiac output was 6.4 with a cardiac index of 3.34 by  Fick.   Right coronary artery:  Right coronary artery is a large caliber and superdominant vessel.  Gives origin to large PL branch and 2 PDA large branches.  Smooth and normal.   Left main coronary artery:  Left main coronary artery is a large-caliber vessel, which is smooth and normal.   Circumflex coronary artery:  Circumflex coronary artery is a very large- caliber vessel.  Gives origin to a moderate-sized obtuse marginal 1 and continues distally as a large obtuse marginal 2.  Smooth and normal.   LAD:  LAD is a large-caliber vessel in the proximal segment.  Gives origin to a moderate-sized diagonal 1 and then gives origin to a very large diagonal 2, which is bigger than the LAD itself.  Between diagonal 1 and diagonal 2, there is a eccentric 50% stenosis noted in the LAD. The diagonal 2, which is very large has a ostial 30% stenosis. Otherwise, except for this focal stenosis of 50%, there is no other lesion noted in the LAD or the large diagonal 2.  LABORATORY DATA: No flowsheet data found.  CMP Latest Ref Rng & Units 08/11/2020 08/01/2020 06/24/2020  Glucose 65 - 99 mg/dL 98 80 113(H)  BUN 8 - 27 mg/dL '27 24 24  '$ Creatinine 0.57 - 1.00 mg/dL 1.71(H) 1.43(H) 1.72(H)  Sodium 134 - 144 mmol/L 141 142 143  Potassium 3.5 - 5.2 mmol/L 4.8 4.9 5.3(H)  Chloride 96 - 106 mmol/L 103 103 105  CO2 20 - 29 mmol/L '22 26 24  '$ Calcium 8.7 - 10.3 mg/dL 9.2 9.7 9.3    Lipid Panel  No results found for: CHOL, TRIG, HDL, CHOLHDL, VLDL, LDLCALC, LDLDIRECT, LABVLDL  No components found for: NTPROBNP Recent Labs    04/27/20 1358 06/24/20 1152 08/01/20 1502 08/11/20 1333  PROBNP 1,000* 641 2,678* 1,311*   No results for input(s): TSH in the last 8760 hours.  BMP Recent Labs    06/24/20 1151 08/01/20 1501 08/11/20 1333  NA 143 142 141  K 5.3* 4.9 4.8  CL 105 103 103  CO2 '24 26 22  '$ GLUCOSE 113* 80 98  BUN '24 24 27  '$ CREATININE 1.72* 1.43* 1.71*  CALCIUM 9.3 9.7 9.2    HEMOGLOBIN  A1C No results found for: HGBA1C, MPG  External labs: 09/07/2019:  Total cholesterol 147, triglycerides 186, HDL 27, LDL 83, non-HDL 120 TSH 2.15 Hemoglobin A1c 6.5 White count 11.75, hemoglobin 11.1 g/dL, hematocrit 34.3%  IMPRESSION:    ICD-10-CM   1. Chronic heart failure with preserved ejection fraction (HCC)  I50.32 bumetanide (BUMEX) 0.5 MG tablet    Basic metabolic panel    Magnesium    Pro b natriuretic peptide (BNP)    2. Dyspnea on exertion  R06.00     3. Nonobstructive atherosclerosis of coronary artery  I25.10     4. Type 2 diabetes mellitus with hyperglycemia, with long-term current use of insulin (HCC)  E11.65    Z79.4     5. Long-term insulin use (HCC)  Z79.4  6. History of COVID-19  Z86.16     7. Mixed hyperlipidemia  E78.2     8. LBBB (left bundle branch block)  I44.7     9. Class 2 severe obesity due to excess calories with serious comorbidity and body mass index (BMI) of 39.0 to 39.9 in adult [facility])  E66.01    Z68.39        RECOMMENDATIONS: Connie Swanson is a 80 y.o. female whose past medical history and cardiac risk factors include: Single-vessel CAD (nonobstructive), insulin-dependent diabetes mellitus type 2, chronic kidney disease, hypertension, history of ischemic colitis, hypothyroidism, obesity due to excess calories, vertigo, history of COVID-19 infection (January 2022), postmenopausal female, advanced age.  Chronic heart failure with preserved EF, NYHA class II: Appears euvolemic on physical examination. Subjectively complains of dyspnea on exertion but improving compared to prior visits. Has lost 2 pounds since last office visit. Medications reconciled. Given the elevated serum creatinine level recommend using Bumex on as needed basis going forward. Repeat blood work in 1 week to evaluate kidney function and NT proBNP. If patient remains stable with regards to NT proBNP and serum creatinine level will consider the addition of Farxiga in the  upcoming weeks.  Dyspnea on exertion: Chronic and now becoming relatively stable VQ scan low probability for PE. Echocardiogram did not illustrate any significant valvular heart disease. Nuclear stress is reported to be high risk due to low LVEF and global hypokinesis.  Independently reviewed SPECT images which did not illustrate reversible ischemia but does have fixed defect involving the inferior and inferoseptal segments most likely secondary to either prior infarction and/or soft tissue attenuation/diaphragmatic artifact as the images were taken in a sitting up position. We have discussed undergoing coronary angiography but after discussing the risks, complications, patient would like to hold off with invasive angiography at this time as she is at risk for progressive CKD.  This was also discussed in great detail with her daughter [name] over the phone during prior visits. Continue to monitor.  Nonobstructive CAD: Noted to have single-vessel CAD back in 2012, see above. Denies angina pectoris Continue aspirin and statin therapy Educated on the importance of improving her modifiable cardiovascular risk factors  Benign essential hypertension: Blood pressures are within acceptable range. As tolerated medication changes for underlying HFpEF well over the last several visits. Low-salt diet recommended Currently managed by primary care provider.  Insulin-dependent diabetes mellitus type 2: Most recent hemoglobin A1c within acceptable range. Educated on the importance of glycemic control. Currently managed by primary care provider.  Obesity, due to excess calories: Body mass index is 38.81 kg/m. I reviewed with the patient the importance of diet, regular physical activity/exercise, weight loss.   Patient is educated on increasing physical activity gradually as tolerated.  With the goal of moderate intensity exercise for 30 minutes a day 5 days a week.  FINAL MEDICATION LIST END OF  ENCOUNTER: Meds ordered this encounter  Medications   bumetanide (BUMEX) 0.5 MG tablet    Sig: Take 1 tablet (0.5 mg total) by mouth as needed (Weight gain >1lb over 24hr or >3lb over 7 days.).    Dispense:  30 tablet    Refill:  1     Medications Discontinued During This Encounter  Medication Reason   bumetanide (BUMEX) 0.5 MG tablet      Current Outpatient Medications:    allopurinol (ZYLOPRIM) 300 MG tablet, Take 300 mg by mouth daily., Disp: , Rfl:    atorvastatin (LIPITOR) 40 MG tablet,  Take 40 mg by mouth daily., Disp: , Rfl:    B-D INS SYR ULTRAFINE 1CC/30G 30G X 1/2" 1 ML MISC, USE SYRINGE AS DIRECTED TWICE DAILY, Disp: , Rfl:    carvedilol (COREG) 25 MG tablet, Take 25 mg by mouth 2 (two) times daily., Disp: , Rfl:    hydrALAZINE (APRESOLINE) 50 MG tablet, Take 1 tablet (50 mg total) by mouth 3 (three) times daily., Disp: 90 tablet, Rfl: 1   isosorbide mononitrate (IMDUR) 30 MG 24 hr tablet, Take 1 tablet (30 mg total) by mouth daily., Disp: 30 tablet, Rfl: 1   metFORMIN (GLUCOPHAGE) 1000 MG tablet, Take 1,000 mg by mouth 2 (two) times daily with a meal., Disp: , Rfl:    NOVOLIN 70/30 RELION (70-30) 100 UNIT/ML injection, SMARTSIG:60 Unit(s) SUB-Q Twice Daily, Disp: , Rfl:    sacubitril-valsartan (ENTRESTO) 49-51 MG, Take 1 tablet by mouth 2 (two) times daily., Disp: 180 tablet, Rfl: 1   bumetanide (BUMEX) 0.5 MG tablet, Take 1 tablet (0.5 mg total) by mouth as needed (Weight gain >1lb over 24hr or >3lb over 7 days.)., Disp: 30 tablet, Rfl: 1  Orders Placed This Encounter  Procedures   Basic metabolic panel   Magnesium   Pro b natriuretic peptide (BNP)   There are no Patient Instructions on file for this visit.   --Continue cardiac medications as reconciled in final medication list. --Return in about 4 weeks (around 09/13/2020) for Follow up, heart failure management.. Or sooner if needed. --Continue follow-up with your primary care physician regarding the management of  your other chronic comorbid conditions.  Patient's questions and concerns were addressed to her satisfaction. She voices understanding of the instructions provided during this encounter.   This note was created using a voice recognition software as a result there may be grammatical errors inadvertently enclosed that do not reflect the nature of this encounter. Every attempt is made to correct such errors.   Rex Kras, [place], [facility]  Pager: [phone] Office: [phone]

## 2020-08-22 ENCOUNTER — Other Ambulatory Visit: Payer: Self-pay

## 2020-08-22 ENCOUNTER — Ambulatory Visit: Payer: Medicare Other | Admitting: Podiatry

## 2020-08-22 ENCOUNTER — Encounter: Payer: Self-pay | Admitting: Podiatry

## 2020-08-22 DIAGNOSIS — E119 Type 2 diabetes mellitus without complications: Secondary | ICD-10-CM | POA: Insufficient documentation

## 2020-08-22 DIAGNOSIS — M79675 Pain in left toe(s): Secondary | ICD-10-CM | POA: Diagnosis not present

## 2020-08-22 DIAGNOSIS — M79674 Pain in right toe(s): Secondary | ICD-10-CM

## 2020-08-22 DIAGNOSIS — B351 Tinea unguium: Secondary | ICD-10-CM | POA: Diagnosis not present

## 2020-08-22 NOTE — Progress Notes (Signed)
This patient returns to my office for at risk foot care.  This patient requires this care by a professional since this patient will be at risk due to having diabetes and chronic kidney disease   This patient is unable to cut nails herself since the patient cannot reach her nails.These nails are painful walking and wearing shoes.  This patient presents for at risk foot care today.  General Appearance  Alert, conversant and in no acute stress.  Vascular  Dorsalis pedis and posterior tibial  pulses are palpable  bilaterally.  Capillary return is within normal limits  bilaterally. Temperature is within normal limits  bilaterally.  Neurologic  Senn-Weinstein monofilament wire test within normal limits  bilaterally. Muscle power within normal limits bilaterally.  Nails Thick disfigured discolored nails with subungual debris  Hallux nails  bilaterally. No evidence of bacterial infection or drainage bilaterally.  Orthopedic  No limitations of motion  feet .  No crepitus or effusions noted.  No bony pathology or digital deformities noted.Mild  HAV  B/L.  Skin  normotropic skin with no porokeratosis noted bilaterally.  No signs of infections or ulcers noted.     Onychomycosis  Pain in right toes  Pain in left toes  Consent was obtained for treatment procedures.   Mechanical debridement of nails 1-5  bilaterally performed with a nail nipper.  Filed with dremel without incident.    Return office visit  10 weeks                   Told patient to return for periodic foot care and evaluation due to potential at risk complications.   Gardiner Barefoot DPM

## 2020-08-23 ENCOUNTER — Other Ambulatory Visit: Payer: Self-pay | Admitting: Cardiology

## 2020-08-23 DIAGNOSIS — I5032 Chronic diastolic (congestive) heart failure: Secondary | ICD-10-CM | POA: Diagnosis not present

## 2020-08-24 ENCOUNTER — Other Ambulatory Visit: Payer: Self-pay

## 2020-08-24 DIAGNOSIS — I5032 Chronic diastolic (congestive) heart failure: Secondary | ICD-10-CM

## 2020-08-24 LAB — MAGNESIUM: Magnesium: 1.7 mg/dL (ref 1.6–2.3)

## 2020-08-24 LAB — BASIC METABOLIC PANEL
BUN/Creatinine Ratio: 15 (ref 12–28)
BUN: 24 mg/dL (ref 8–27)
CO2: 26 mmol/L (ref 20–29)
Calcium: 9.5 mg/dL (ref 8.7–10.3)
Chloride: 104 mmol/L (ref 96–106)
Creatinine, Ser: 1.56 mg/dL — ABNORMAL HIGH (ref 0.57–1.00)
Glucose: 76 mg/dL (ref 65–99)
Potassium: 5 mmol/L (ref 3.5–5.2)
Sodium: 141 mmol/L (ref 134–144)
eGFR: 33 mL/min/{1.73_m2} — ABNORMAL LOW (ref >59)

## 2020-08-24 LAB — PRO B NATRIURETIC PEPTIDE: NT-Pro BNP: 1210 pg/mL — ABNORMAL HIGH (ref 0–738)

## 2020-08-24 NOTE — Progress Notes (Signed)
DONE

## 2020-08-31 ENCOUNTER — Ambulatory Visit: Payer: Medicare Other | Admitting: Podiatry

## 2020-09-07 DIAGNOSIS — N184 Chronic kidney disease, stage 4 (severe): Secondary | ICD-10-CM | POA: Diagnosis not present

## 2020-09-07 DIAGNOSIS — E1122 Type 2 diabetes mellitus with diabetic chronic kidney disease: Secondary | ICD-10-CM | POA: Diagnosis not present

## 2020-09-07 DIAGNOSIS — I509 Heart failure, unspecified: Secondary | ICD-10-CM | POA: Diagnosis not present

## 2020-09-07 DIAGNOSIS — I130 Hypertensive heart and chronic kidney disease with heart failure and stage 1 through stage 4 chronic kidney disease, or unspecified chronic kidney disease: Secondary | ICD-10-CM | POA: Diagnosis not present

## 2020-09-08 DIAGNOSIS — Z961 Presence of intraocular lens: Secondary | ICD-10-CM | POA: Diagnosis not present

## 2020-09-08 DIAGNOSIS — H0102A Squamous blepharitis right eye, upper and lower eyelids: Secondary | ICD-10-CM | POA: Diagnosis not present

## 2020-09-08 DIAGNOSIS — H0102B Squamous blepharitis left eye, upper and lower eyelids: Secondary | ICD-10-CM | POA: Diagnosis not present

## 2020-09-08 DIAGNOSIS — E119 Type 2 diabetes mellitus without complications: Secondary | ICD-10-CM | POA: Diagnosis not present

## 2020-09-14 ENCOUNTER — Other Ambulatory Visit: Payer: Self-pay | Admitting: Internal Medicine

## 2020-09-14 DIAGNOSIS — Z1231 Encounter for screening mammogram for malignant neoplasm of breast: Secondary | ICD-10-CM

## 2020-09-20 ENCOUNTER — Other Ambulatory Visit: Payer: Self-pay | Admitting: Cardiology

## 2020-09-20 DIAGNOSIS — I10 Essential (primary) hypertension: Secondary | ICD-10-CM

## 2020-09-20 DIAGNOSIS — I5032 Chronic diastolic (congestive) heart failure: Secondary | ICD-10-CM

## 2020-09-26 DIAGNOSIS — E1165 Type 2 diabetes mellitus with hyperglycemia: Secondary | ICD-10-CM | POA: Diagnosis not present

## 2020-09-26 DIAGNOSIS — E039 Hypothyroidism, unspecified: Secondary | ICD-10-CM | POA: Diagnosis not present

## 2020-09-26 DIAGNOSIS — M8589 Other specified disorders of bone density and structure, multiple sites: Secondary | ICD-10-CM | POA: Diagnosis not present

## 2020-09-26 DIAGNOSIS — E559 Vitamin D deficiency, unspecified: Secondary | ICD-10-CM | POA: Diagnosis not present

## 2020-09-26 DIAGNOSIS — E7800 Pure hypercholesterolemia, unspecified: Secondary | ICD-10-CM | POA: Diagnosis not present

## 2020-10-03 DIAGNOSIS — R82998 Other abnormal findings in urine: Secondary | ICD-10-CM | POA: Diagnosis not present

## 2020-10-03 DIAGNOSIS — E1139 Type 2 diabetes mellitus with other diabetic ophthalmic complication: Secondary | ICD-10-CM | POA: Diagnosis not present

## 2020-10-03 DIAGNOSIS — Z794 Long term (current) use of insulin: Secondary | ICD-10-CM | POA: Diagnosis not present

## 2020-10-03 DIAGNOSIS — Z0000 Encounter for general adult medical examination without abnormal findings: Secondary | ICD-10-CM | POA: Diagnosis not present

## 2020-10-03 DIAGNOSIS — E1129 Type 2 diabetes mellitus with other diabetic kidney complication: Secondary | ICD-10-CM | POA: Diagnosis not present

## 2020-10-04 ENCOUNTER — Ambulatory Visit: Payer: Medicare Other | Admitting: Cardiology

## 2020-10-07 DIAGNOSIS — N184 Chronic kidney disease, stage 4 (severe): Secondary | ICD-10-CM | POA: Diagnosis not present

## 2020-10-07 DIAGNOSIS — I130 Hypertensive heart and chronic kidney disease with heart failure and stage 1 through stage 4 chronic kidney disease, or unspecified chronic kidney disease: Secondary | ICD-10-CM | POA: Diagnosis not present

## 2020-10-07 DIAGNOSIS — E1122 Type 2 diabetes mellitus with diabetic chronic kidney disease: Secondary | ICD-10-CM | POA: Diagnosis not present

## 2020-10-07 DIAGNOSIS — I509 Heart failure, unspecified: Secondary | ICD-10-CM | POA: Diagnosis not present

## 2020-10-13 DIAGNOSIS — I5032 Chronic diastolic (congestive) heart failure: Secondary | ICD-10-CM | POA: Diagnosis not present

## 2020-10-14 LAB — BASIC METABOLIC PANEL
BUN/Creatinine Ratio: 18 (ref 12–28)
BUN: 27 mg/dL (ref 8–27)
CO2: 24 mmol/L (ref 20–29)
Calcium: 9 mg/dL (ref 8.7–10.3)
Chloride: 103 mmol/L (ref 96–106)
Creatinine, Ser: 1.48 mg/dL — ABNORMAL HIGH (ref 0.57–1.00)
Glucose: 161 mg/dL — ABNORMAL HIGH (ref 70–99)
Potassium: 4.9 mmol/L (ref 3.5–5.2)
Sodium: 142 mmol/L (ref 134–144)
eGFR: 35 mL/min/{1.73_m2} — ABNORMAL LOW (ref >59)

## 2020-10-14 LAB — PRO B NATRIURETIC PEPTIDE: NT-Pro BNP: 1802 pg/mL — ABNORMAL HIGH (ref 0–738)

## 2020-10-14 LAB — MAGNESIUM: Magnesium: 1.7 mg/dL (ref 1.6–2.3)

## 2020-10-18 NOTE — Progress Notes (Signed)
Patient aware about appointment tomorrow, and to bring all her medication bottles in as well.

## 2020-10-19 ENCOUNTER — Ambulatory Visit: Payer: Medicare Other | Admitting: Cardiology

## 2020-10-19 ENCOUNTER — Encounter: Payer: Self-pay | Admitting: Cardiology

## 2020-10-19 ENCOUNTER — Other Ambulatory Visit: Payer: Self-pay

## 2020-10-19 VITALS — BP 151/74 | HR 87 | Resp 16 | Ht 62.0 in | Wt 215.0 lb

## 2020-10-19 DIAGNOSIS — R0609 Other forms of dyspnea: Secondary | ICD-10-CM

## 2020-10-19 DIAGNOSIS — Z8616 Personal history of COVID-19: Secondary | ICD-10-CM

## 2020-10-19 DIAGNOSIS — E782 Mixed hyperlipidemia: Secondary | ICD-10-CM

## 2020-10-19 DIAGNOSIS — I447 Left bundle-branch block, unspecified: Secondary | ICD-10-CM

## 2020-10-19 DIAGNOSIS — I10 Essential (primary) hypertension: Secondary | ICD-10-CM

## 2020-10-19 DIAGNOSIS — Z794 Long term (current) use of insulin: Secondary | ICD-10-CM

## 2020-10-19 DIAGNOSIS — I2510 Atherosclerotic heart disease of native coronary artery without angina pectoris: Secondary | ICD-10-CM | POA: Diagnosis not present

## 2020-10-19 DIAGNOSIS — I5032 Chronic diastolic (congestive) heart failure: Secondary | ICD-10-CM | POA: Diagnosis not present

## 2020-10-19 DIAGNOSIS — E1165 Type 2 diabetes mellitus with hyperglycemia: Secondary | ICD-10-CM

## 2020-10-19 DIAGNOSIS — R55 Syncope and collapse: Secondary | ICD-10-CM | POA: Diagnosis not present

## 2020-10-19 MED ORDER — ISOSORBIDE MONONITRATE ER 30 MG PO TB24: 15.0000 mg | ORAL_TABLET | Freq: Every day | ORAL | 0 refills | Status: DC

## 2020-10-19 NOTE — Progress Notes (Signed)
Date:  08/16/2020   ID:  Connie Swanson, DOB 1940/02/28, MRN [id number]  PCP:  Connie Pao, MD  Cardiologist:  Connie Kras, DO, [facility] (established care 04/27/2020) Former Cardiology Providers: Dr. Adrian Prows   Date: 10/19/20 Last Office Visit: 08/16/2020  Chief Complaint  Patient presents with   Follow-up   Congestive Heart Failure   Loss of Consciousness    HPI  Connie Swanson is a 80 y.o. female who presents to the office with a chief complaint of " follow-up for congestive heart failure and syncope." Patient's past medical history and cardiovascular risk factors include: HFpEF, single-vessel CAD (nonobstructive), insulin-dependent diabetes mellitus type 2, chronic kidney disease, hypertension, history of ischemic colitis, hypothyroidism, obesity due to excess calories, vertigo, history of COVID-19 infection (January 2022), postmenopausal female, advanced age.  She is referred to the office at the request of Connie Swanson, Connie Him, MD for evaluation of heart failure.  Patient is accompanied by her son Connie Swanson at today's office visit and her daughter Connie Swanson is over the phone.  Patient is here for regular scheduled visit but last couple weeks she has been feeling tired, fatigued, and passed out twice.  Patient states that she did not have a mechanical fall but was resting in her recliner/bed and had lost consciousness briefly.  Based on her family members it appears that the patient has not been eating 3 balanced meals and has had decreased oral hydration.  After one of the episodes she had intractable nausea for couple days.  Patient is compliant with her current medical therapy and most recent labs from 10/sick/2022 independently reviewed at today's visit.  She is currently using Bumex on as needed basis and she has gained approximately 3 pounds since last office visit.  Her dyspnea on exertion remains relatively stable.  And denies angina pectoris at rest or with effort related activities.   Given her dyspnea she had undergone a nuclear stress test which was reported as high risk but given her chronic kidney disease she decided to hold off on invasive angiography until unless she has worsening symptoms or has new onset of chest pain.  She continues to feel the same regarding this as well.  FUNCTIONAL STATUS: No structured exercise program or daily routine.   ALLERGIES: No Known Allergies  MEDICATION LIST PRIOR TO VISIT: Current Meds  Medication Sig   allopurinol (ZYLOPRIM) 300 MG tablet Take 300 mg by mouth daily.   atorvastatin (LIPITOR) 40 MG tablet Take 40 mg by mouth daily.   B-D INS SYR ULTRAFINE 1CC/30G 30G X 1/2" 1 ML MISC USE SYRINGE AS DIRECTED TWICE DAILY   bumetanide (BUMEX) 0.5 MG tablet Take 1 tablet (0.5 mg total) by mouth as needed (Weight gain >1lb over 24hr or >3lb over 7 days.).   carvedilol (COREG) 25 MG tablet Take 25 mg by mouth 2 (two) times daily.   hydrALAZINE (APRESOLINE) 50 MG tablet Take 1 tablet (50 mg total) by mouth 3 (three) times daily.   metFORMIN (GLUCOPHAGE) 1000 MG tablet Take 1,000 mg by mouth 2 (two) times daily with a meal.   NOVOLIN 70/30 RELION (70-30) 100 UNIT/ML injection SMARTSIG:60 Unit(s) SUB-Q Twice Daily   sacubitril-valsartan (ENTRESTO) 49-51 MG Take 1 tablet by mouth 2 (two) times daily.   [DISCONTINUED] isosorbide mononitrate (IMDUR) 30 MG 24 hr tablet Take 1 tablet by mouth once daily     PAST MEDICAL HISTORY: Past Medical History:  Diagnosis Date   CHF (congestive heart failure) ([place])  Chronic kidney disease    Coronary artery disease    Diabetes mellitus without complication (Connie Swanson)    Hyperlipidemia    Hypertension     PAST SURGICAL HISTORY: Past Surgical History:  Procedure Laterality Date   BREAST EXCISIONAL BIOPSY Right    HEMORRHOID SURGERY     MENISCUS REPAIR     THYROID SURGERY     VAGINAL HYSTERECTOMY      FAMILY HISTORY: The patient family history is not on file. She was adopted.  SOCIAL  HISTORY:  The patient  reports that she has never smoked. She has never used smokeless tobacco. She reports that she does not drink alcohol and does not use drugs.  REVIEW OF SYSTEMS: Review of Systems  Constitutional: Positive for malaise/fatigue (chronic). Negative for chills and fever.  HENT:  Negative for hoarse voice and nosebleeds.   Eyes:  Negative for discharge, double vision and pain.  Cardiovascular:  Positive for dyspnea on exertion. Negative for chest pain, claudication, leg swelling, near-syncope, orthopnea, palpitations, paroxysmal nocturnal dyspnea and syncope.  Respiratory:  Positive for shortness of breath. Negative for hemoptysis.   Musculoskeletal:  Negative for muscle cramps and myalgias.  Gastrointestinal:  Negative for abdominal pain, constipation, diarrhea, hematemesis, hematochezia, melena, nausea and vomiting.  Neurological:  Negative for dizziness and light-headedness.   PHYSICAL EXAM: Vitals with BMI 10/19/2020 08/16/2020 07/15/2020  Height '5\' 2"'$  '5\' 2"'$  -  Weight 215 lbs 212 lbs 3 oz -  BMI [id number] -  Systolic 123XX123 0000000 0000000  Diastolic 74 65 82  Pulse 87 82 68   Orthostatic VS for the past 72 hrs (Last 3 readings):  Orthostatic BP Patient Position BP Location Cuff Size Orthostatic Pulse  10/19/20 1055 102/52 Standing Left Arm Large 85  10/19/20 1054 124/56 Sitting Left Arm Large 81  10/19/20 1053 118/52 Supine Left Arm Large 81   CONSTITUTIONAL: Age-appropriate female, hemodynamically stable, no acute distress.    SKIN: Skin is warm and dry. No rash noted. No cyanosis. No pallor. No jaundice HEAD: Normocephalic and atraumatic.  EYES: No scleral icterus MOUTH/THROAT: Moist oral membranes.  NECK: Short neck, increased adipose tissue, no JVD present. No thyromegaly noted. No carotid bruits  LYMPHATIC: No visible cervical adenopathy.  CHEST Normal respiratory effort. No intercostal retractions  LUNGS: Clear to auscultation bilaterally.  No stridor. No wheezes.  No rales.  CARDIOVASCULAR: Regular rate and rhythm, positive [id number], soft holosystolic murmur heard at the apex, no gallops or rubs ABDOMINAL: Obese, nontender, nondistended, positive bowel sounds in all 4 quadrants, no apparent ascites.  EXTREMITIES: No peripheral edema,  2+ bilateral posterior tibial pulses. HEMATOLOGIC: No significant bruising NEUROLOGIC: Oriented to person, place, and time. Nonfocal. Normal muscle tone.  PSYCHIATRIC: Normal mood and affect. Normal behavior. Cooperative  RADIOLOGY:  VQ Scan:  04/28/2020: Negative perfusion lung scan for pulmonary embolism.  CARDIAC DATABASE: EKG: 10/19/2020: Normal sinus rhythm, 79 bpm, left axis, left bundle branch block.  No significant change compared to prior ECG.  Echocardiogram: 05/04/2020: Normal LV systolic function with visual EF 50-55%. Left ventricle cavity is normal in size. Severe concentric hypertrophy of the left ventricle. Normal global wall motion. Unable to evaluate diastolic function due to mitral annular calcification. Elevated LAP.  Native mitral valve. Posterior annular calcification. No evidence of mitral stenosis. Mild (Grade I) mitral regurgitation. Mild tricuspid regurgitation. No evidence of pulmonary hypertension. RVSP measures 32 mmHg. No prior studies available for comparison.   Stress Testing: Lexiscan Tetrofosmin stress test 05/11/2020: Lexiscan nuclear stress test  performed using 1-day protocol. SPECT images show uniform breast tissue attenuation in both rest and stress images. In addition, there is medium sized, medium intensity, fixed perfusion defect in basal inferior/inferoseptal myocardium. All segments of left ventricle demonstrated septal dyskinesis, global hypokinesis. Stress LVEF 34%. High risk study due to low stress LVEF.  Heart Catheterization: 10/10/2010: HEMODYNAMIC DATA:  Right heart catheterization:  RA pressure 7/7, mean 6 mmHg.  RA saturation 76%.   RV pressure 123456, end-diastolic  pressure 4 mmHg.   PA pressure 27/40 with a mean of 21 mmHg.  PA saturation was 75%.   Pulmonary capillary wedge 6/5 with a mean of 3 mmHg.  Aortic saturation was 100%.   Cardiac output was 6.4 with a cardiac index of 3.34 by Fick.   Right coronary artery:  Right coronary artery is a large caliber and superdominant vessel.  Gives origin to large PL branch and 2 PDA large branches.  Smooth and normal.   Left main coronary artery:  Left main coronary artery is a large-caliber vessel, which is smooth and normal.   Circumflex coronary artery:  Circumflex coronary artery is a very large- caliber vessel.  Gives origin to a moderate-sized obtuse marginal 1 and continues distally as a large obtuse marginal 2.  Smooth and normal.   LAD:  LAD is a large-caliber vessel in the proximal segment.  Gives origin to a moderate-sized diagonal 1 and then gives origin to a very large diagonal 2, which is bigger than the LAD itself.  Between diagonal 1 and diagonal 2, there is a eccentric 50% stenosis noted in the LAD. The diagonal 2, which is very large has a ostial 30% stenosis. Otherwise, except for this focal stenosis of 50%, there is no other lesion noted in the LAD or the large diagonal 2.  LABORATORY DATA: No flowsheet data found.  CMP Latest Ref Rng & Units 10/13/2020 08/23/2020 08/11/2020  Glucose 70 - 99 mg/dL 161(H) 76 98  BUN 8 - 27 mg/dL '27 24 27  '$ Creatinine 0.57 - 1.00 mg/dL 1.48(H) 1.56(H) 1.71(H)  Sodium 134 - 144 mmol/L 142 141 141  Potassium 3.5 - 5.2 mmol/L 4.9 5.0 4.8  Chloride 96 - 106 mmol/L 103 104 103  CO2 20 - 29 mmol/L '24 26 22  '$ Calcium 8.7 - 10.3 mg/dL 9.0 9.5 9.2    Lipid Panel  No results found for: CHOL, TRIG, HDL, CHOLHDL, VLDL, LDLCALC, LDLDIRECT, LABVLDL  No components found for: NTPROBNP Recent Labs    04/27/20 1358 06/24/20 1152 08/01/20 1502 08/11/20 1333 08/23/20 1142 10/13/20 1323  PROBNP 1,000* 641 2,678* 1,311* 1,210* 1,802*   No results for  input(s): TSH in the last 8760 hours.  BMP Recent Labs    08/11/20 1333 08/23/20 1142 10/13/20 1320  NA 141 141 142  K 4.8 5.0 4.9  CL 103 104 103  CO2 '22 26 24  '$ GLUCOSE 98 76 161*  BUN '27 24 27  '$ CREATININE 1.71* 1.56* 1.48*  CALCIUM 9.2 9.5 9.0    HEMOGLOBIN A1C No results found for: HGBA1C, MPG  External labs: 09/07/2019:  Total cholesterol 147, triglycerides 186, HDL 27, LDL 83, non-HDL 120 TSH 2.15 Hemoglobin A1c 6.5 White count 11.75, hemoglobin 11.1 g/dL, hematocrit 34.3%  IMPRESSION:    ICD-10-CM   1. Syncope and collapse  R55 EKG 12-Lead    2. Chronic heart failure with preserved ejection fraction (HCC)  I50.32 isosorbide mononitrate (IMDUR) 30 MG 24 hr tablet    3. Dyspnea on exertion  R06.09  4. Nonobstructive atherosclerosis of coronary artery  I25.10     5. Type 2 diabetes mellitus with hyperglycemia, with long-term current use of insulin (HCC)  E11.65    Z79.4     6. Long-term insulin use (HCC)  Z79.4     7. LBBB (left bundle branch block)  I44.7     8. Mixed hyperlipidemia  E78.2     9. Class 2 severe obesity due to excess calories with serious comorbidity and body mass index (BMI) of 39.0 to 39.9 in adult ([place])  E66.01    Z68.39     10. Benign hypertension  I10 isosorbide mononitrate (IMDUR) 30 MG 24 hr tablet    11. History of COVID-19  Z86.16        RECOMMENDATIONS: LIVINIA WRENCH is a 80 y.o. female whose past medical history and cardiac risk factors include: Single-vessel CAD (nonobstructive), insulin-dependent diabetes mellitus type 2, chronic kidney disease, hypertension, history of ischemic colitis, hypothyroidism, obesity due to excess calories, vertigo, history of COVID-19 infection (January 2022), postmenopausal female, advanced age.  Syncope: Orthostatic vital signs positive. Currently uses Bumex on as needed basis. Patient's family states that her brief episodes of syncope are most likely secondary to dehydration and not  consuming 3 balanced meals. We discussed proceeding with a Holter monitor to evaluate for underlying dysrhythmias.  However, she would like to hold off on additional testing at this time. Given her syncopal event patient is advised with regards to driving restrictions as per [facility]. Reviewed the most recent ischemic work-up including echo and stress test with the patient and her family members during today's encounter.  The shared decision was to hold off on additional invasive testing at this time until unless there is change in clinical status. Most recent labs from 10/13/2020 independently reviewed. Recommended holding isosorbide mononitrate for 1 week.  In the following week to reinitiate isosorbide mononitrate at 15 mg p.o. daily and reevaluate. I like to see her back in 1 month or sooner if change in clinical status.  Chronic heart failure with preserved EF, NYHA class II: Appears euvolemic on physical examination. Medications reconciled. Reduce the dose of Imdur as discussed above. Continue using Bumex on as needed basis. Initial plan was to initiate Farxiga at today's office visit; however, given her episodes of syncope and orthostasis will hold off for now.  Dyspnea on exertion: Chronic  Monitor for now. Has undergone extensive work-up including VQ scan, echo, and nuclear stress test.  Results reviewed and noted above for further reference.  Nonobstructive CAD: Noted to have single-vessel CAD back in 2012, see above. Denies angina pectoris Continue aspirin and statin therapy Educated on the importance of improving her modifiable cardiovascular risk factors Of note, most recent stress testing May 2022 was reported to be high risk secondary to reduced LVEF and fixed myocardial perfusion defect involving the base inferior/inferoseptal segments.  In the setting of chronic kidney disease overall asymptomatic with regards to angina pectoris that shared decision was to implement  lifestyle changes to improve her modifiable cardiovascular risk factors.  She still feels the same.  Benign essential hypertension: Slightly elevated but within acceptable range. Will not uptitrate her antihypertensive medications given her recent syncope and orthostatic vital signs being positive. Monitor for now.  Insulin-dependent diabetes mellitus type 2: Most recent hemoglobin A1c within acceptable range. Educated on the importance of glycemic control. Currently managed by primary care provider.  Obesity, due to excess calories: Body mass index is 39.32 kg/m. I  reviewed with the patient the importance of diet, regular physical activity/exercise, weight loss.   Patient is educated on increasing physical activity gradually as tolerated.  With the goal of moderate intensity exercise for 30 minutes a day 5 days a week.  FINAL MEDICATION LIST END OF ENCOUNTER: Meds ordered this encounter  Medications   isosorbide mononitrate (IMDUR) 30 MG 24 hr tablet    Sig: Take 0.5 tablets (15 mg total) by mouth daily.    Dispense:  15 tablet    Refill:  0      Medications Discontinued During This Encounter  Medication Reason   isosorbide mononitrate (IMDUR) 30 MG 24 hr tablet       Current Outpatient Medications:    allopurinol (ZYLOPRIM) 300 MG tablet, Take 300 mg by mouth daily., Disp: , Rfl:    atorvastatin (LIPITOR) 40 MG tablet, Take 40 mg by mouth daily., Disp: , Rfl:    B-D INS SYR ULTRAFINE 1CC/30G 30G X 1/2" 1 ML MISC, USE SYRINGE AS DIRECTED TWICE DAILY, Disp: , Rfl:    bumetanide (BUMEX) 0.5 MG tablet, Take 1 tablet (0.5 mg total) by mouth as needed (Weight gain >1lb over 24hr or >3lb over 7 days.)., Disp: 30 tablet, Rfl: 1   carvedilol (COREG) 25 MG tablet, Take 25 mg by mouth 2 (two) times daily., Disp: , Rfl:    hydrALAZINE (APRESOLINE) 50 MG tablet, Take 1 tablet (50 mg total) by mouth 3 (three) times daily., Disp: 90 tablet, Rfl: 1   metFORMIN (GLUCOPHAGE) 1000 MG tablet,  Take 1,000 mg by mouth 2 (two) times daily with a meal., Disp: , Rfl:    NOVOLIN 70/30 RELION (70-30) 100 UNIT/ML injection, SMARTSIG:60 Unit(s) SUB-Q Twice Daily, Disp: , Rfl:    sacubitril-valsartan (ENTRESTO) 49-51 MG, Take 1 tablet by mouth 2 (two) times daily., Disp: , Rfl:    isosorbide mononitrate (IMDUR) 30 MG 24 hr tablet, Take 0.5 tablets (15 mg total) by mouth daily., Disp: 15 tablet, Rfl: 0  Orders Placed This Encounter  Procedures   EKG 12-Lead   There are no Patient Instructions on file for this visit.   --Continue cardiac medications as reconciled in final medication list. --Return in about 4 weeks (around 11/16/2020) for Follow up HFpEF and syncope (orthostasis) . Or sooner if needed. --Continue follow-up with your primary care physician regarding the management of your other chronic comorbid conditions.  Patient's questions and concerns were addressed to her satisfaction. She voices understanding of the instructions provided during this encounter.   This note was created using a voice recognition software as a result there may be grammatical errors inadvertently enclosed that do not reflect the nature of this encounter. Every attempt is made to correct such errors.   Connie Swanson, [place], [facility]  Pager: [phone] Office: [phone]

## 2020-10-20 ENCOUNTER — Other Ambulatory Visit (HOSPITAL_COMMUNITY): Payer: Self-pay | Admitting: *Deleted

## 2020-10-20 ENCOUNTER — Ambulatory Visit
Admission: RE | Admit: 2020-10-20 | Discharge: 2020-10-20 | Disposition: A | Discharge status: Home / Self Care | Payer: Medicare Other | Source: Ambulatory Visit | Attending: Internal Medicine | Admitting: Internal Medicine

## 2020-10-20 DIAGNOSIS — Z1231 Encounter for screening mammogram for malignant neoplasm of breast: Secondary | ICD-10-CM

## 2020-10-21 ENCOUNTER — Other Ambulatory Visit: Payer: Self-pay | Admitting: Cardiology

## 2020-10-21 ENCOUNTER — Encounter (HOSPITAL_COMMUNITY)
Admission: RE | Admit: 2020-10-21 | Discharge: 2020-10-21 | Disposition: A | Discharge status: Home / Self Care | Payer: Medicare Other | Source: Ambulatory Visit | Attending: Internal Medicine | Admitting: Internal Medicine

## 2020-10-21 DIAGNOSIS — I5032 Chronic diastolic (congestive) heart failure: Secondary | ICD-10-CM

## 2020-10-21 DIAGNOSIS — M810 Age-related osteoporosis without current pathological fracture: Secondary | ICD-10-CM | POA: Insufficient documentation

## 2020-10-21 DIAGNOSIS — I10 Essential (primary) hypertension: Secondary | ICD-10-CM

## 2020-10-21 MED ORDER — DENOSUMAB 60 MG/ML ~~LOC~~ SOSY
60.0000 mg | PREFILLED_SYRINGE | Freq: Once | SUBCUTANEOUS | Status: AC
  Administered 2020-10-21: 60 mg via SUBCUTANEOUS

## 2020-10-21 MED ORDER — DENOSUMAB 60 MG/ML ~~LOC~~ SOSY
PREFILLED_SYRINGE | SUBCUTANEOUS | Status: AC
  Filled 2020-10-21: qty 1

## 2020-10-24 ENCOUNTER — Ambulatory Visit: Payer: Medicare Other | Admitting: Cardiology

## 2020-11-01 ENCOUNTER — Encounter: Payer: Self-pay | Admitting: Podiatry

## 2020-11-01 ENCOUNTER — Ambulatory Visit: Payer: Medicare Other | Admitting: Podiatry

## 2020-11-01 ENCOUNTER — Other Ambulatory Visit: Payer: Self-pay

## 2020-11-01 DIAGNOSIS — B351 Tinea unguium: Secondary | ICD-10-CM

## 2020-11-01 DIAGNOSIS — M79675 Pain in left toe(s): Secondary | ICD-10-CM

## 2020-11-01 DIAGNOSIS — E119 Type 2 diabetes mellitus without complications: Secondary | ICD-10-CM | POA: Diagnosis not present

## 2020-11-01 DIAGNOSIS — M79674 Pain in right toe(s): Secondary | ICD-10-CM | POA: Diagnosis not present

## 2020-11-01 NOTE — Progress Notes (Signed)
This patient returns to my office for at risk foot care.  This patient requires this care by a professional since this patient will be at risk due to having diabetes and chronic kidney disease   This patient is unable to cut nails herself since the patient cannot reach her nails.These nails are painful walking and wearing shoes.  This patient presents for at risk foot care today.  General Appearance  Alert, conversant and in no acute stress.  Vascular  Dorsalis pedis and posterior tibial  pulses are palpable  bilaterally.  Capillary return is within normal limits  bilaterally. Temperature is within normal limits  bilaterally.  Neurologic  Senn-Weinstein monofilament wire test within normal limits  bilaterally. Muscle power within normal limits bilaterally.  Nails Thick disfigured discolored nails with subungual debris  Hallux nails  bilaterally. No evidence of bacterial infection or drainage bilaterally.  Orthopedic  No limitations of motion  feet .  No crepitus or effusions noted.  No bony pathology or digital deformities noted.Mild  HAV  B/L.  Skin  normotropic skin with no porokeratosis noted bilaterally.  No signs of infections or ulcers noted.     Onychomycosis  Pain in right toes  Pain in left toes  Consent was obtained for treatment procedures.   Mechanical debridement of nails 1-5  bilaterally performed with a nail nipper.  Filed with dremel without incident.    Return office visit  10 weeks                   Told patient to return for periodic foot care and evaluation due to potential at risk complications.   Gardiner Barefoot DPM

## 2020-11-07 DIAGNOSIS — I509 Heart failure, unspecified: Secondary | ICD-10-CM | POA: Diagnosis not present

## 2020-11-07 DIAGNOSIS — N184 Chronic kidney disease, stage 4 (severe): Secondary | ICD-10-CM | POA: Diagnosis not present

## 2020-11-07 DIAGNOSIS — E1122 Type 2 diabetes mellitus with diabetic chronic kidney disease: Secondary | ICD-10-CM | POA: Diagnosis not present

## 2020-11-07 DIAGNOSIS — I130 Hypertensive heart and chronic kidney disease with heart failure and stage 1 through stage 4 chronic kidney disease, or unspecified chronic kidney disease: Secondary | ICD-10-CM | POA: Diagnosis not present

## 2020-11-14 ENCOUNTER — Other Ambulatory Visit: Payer: Self-pay | Admitting: Cardiology

## 2020-11-14 DIAGNOSIS — I5032 Chronic diastolic (congestive) heart failure: Secondary | ICD-10-CM

## 2020-11-14 DIAGNOSIS — I10 Essential (primary) hypertension: Secondary | ICD-10-CM

## 2020-11-21 ENCOUNTER — Telehealth: Payer: Self-pay

## 2020-11-21 NOTE — Telephone Encounter (Signed)
Patient wanted to know if you want her to have labs before her appointment with you next week?

## 2020-11-22 NOTE — Telephone Encounter (Signed)
Yes, please order and release BMP,BNP, MG.

## 2020-11-23 ENCOUNTER — Other Ambulatory Visit: Payer: Self-pay

## 2020-11-23 DIAGNOSIS — I5032 Chronic diastolic (congestive) heart failure: Secondary | ICD-10-CM

## 2020-11-23 NOTE — Telephone Encounter (Signed)
Orders have been added and released spoke to patient she is aware that she needs labs before her ov

## 2020-11-24 DIAGNOSIS — I5032 Chronic diastolic (congestive) heart failure: Secondary | ICD-10-CM | POA: Diagnosis not present

## 2020-11-25 LAB — PRO B NATRIURETIC PEPTIDE: NT-Pro BNP: 2066 pg/mL — ABNORMAL HIGH (ref 0–738)

## 2020-11-25 LAB — MAGNESIUM: Magnesium: 1.5 mg/dL — ABNORMAL LOW (ref 1.6–2.3)

## 2020-11-25 LAB — BASIC METABOLIC PANEL
BUN/Creatinine Ratio: 12 (ref 12–28)
BUN: 18 mg/dL (ref 8–27)
CO2: 25 mmol/L (ref 20–29)
Calcium: 8.5 mg/dL — ABNORMAL LOW (ref 8.7–10.3)
Chloride: 104 mmol/L (ref 96–106)
Creatinine, Ser: 1.49 mg/dL — ABNORMAL HIGH (ref 0.57–1.00)
Glucose: 136 mg/dL — ABNORMAL HIGH (ref 70–99)
Potassium: 4.5 mmol/L (ref 3.5–5.2)
Sodium: 144 mmol/L (ref 134–144)
eGFR: 35 mL/min/{1.73_m2} — ABNORMAL LOW (ref >59)

## 2020-11-27 NOTE — Progress Notes (Signed)
ID:  Connie Swanson, DOB Oct 01, 1940, MRN [id number]  PCP:  Haywood Pao, MD  Cardiologist:  Rex Kras, DO, [facility] (established care 04/27/2020) Former Cardiology Providers: Dr. Adrian Prows   Date: 11/28/20 Last Office Visit: 10/19/2020  Chief Complaint  Patient presents with   HFpEF   Loss of Consciousness    4 weeks    HPI  Connie Swanson is a 80 y.o. female who presents to the office with a chief complaint of "follow-up for congestive heart failure." Patient's past medical history and cardiovascular risk factors include: HFpEF, single-vessel CAD (nonobstructive), insulin-dependent diabetes mellitus type 2, chronic kidney disease, hypertension, history of ischemic colitis, hypothyroidism, obesity due to excess calories, vertigo, history of COVID-19 infection (January 2022), postmenopausal female, advanced age.  She is referred to the office at the request of [name], [name], MD for evaluation of heart failure.  Patient's medical therapy has been uptitrated in a stepwise fashion with close monitoring of renal function.  At the last office visit she was having symptoms of orthostatic hypotension and also had a few syncopal events prior to the office visit which were attributed to dehydration and not consuming balanced meals throughout the day.  She was taking diuretic therapy on as-needed basis.  And therefore the shared decision was to increase oral fluid and food intake and to reduce the dose of isosorbide mononitrate to 15 mg p.o. daily.  Most recent labs from 11/24/2020 independently reviewed with the patient at today's office visit.  Since last office visit, no longer is having syncopal events and blood pressures are better with titration of medications.  However patient continues to have generalized tired/fatigue, bilateral lower extremity weakness with difficulty going from a seated to standing position.  She denies any chest pain at rest or with activities.  Given her dyspnea on  exertion she had undergone an ischemic evaluation with nuclear stress testing which was reported as high risk but given her chronic kidney disease she decided to hold off on invasive angiography until unless she has worsening symptoms or has new onset of chest pain.  She continues to feel the same regarding this as well.  FUNCTIONAL STATUS: No structured exercise program or daily routine.   ALLERGIES: No Known Allergies  MEDICATION LIST PRIOR TO VISIT: Current Meds  Medication Sig   allopurinol (ZYLOPRIM) 300 MG tablet Take 300 mg by mouth daily.   atorvastatin (LIPITOR) 40 MG tablet Take 40 mg by mouth daily.   B-D INS SYR ULTRAFINE 1CC/30G 30G X 1/2" 1 ML MISC USE SYRINGE AS DIRECTED TWICE DAILY   bumetanide (BUMEX) 0.5 MG tablet Take 1 tablet (0.5 mg total) by mouth as needed (Weight gain >1lb over 24hr or >3lb over 7 days.).   carvedilol (COREG) 25 MG tablet Take 25 mg by mouth 2 (two) times daily.   denosumab (PROLIA) 60 MG/ML SOSY injection Inject 60 mg into the skin every 6 (six) months.   isosorbide mononitrate (IMDUR) 30 MG 24 hr tablet Take 0.5 tablets (15 mg total) by mouth daily.   Magnesium Oxide 400 MG CAPS Take 1 capsule (400 mg total) by mouth in the morning, at noon, and at bedtime.   metFORMIN (GLUCOPHAGE) 1000 MG tablet Take 1,000 mg by mouth 2 (two) times daily with a meal.   NOVOLIN 70/30 RELION (70-30) 100 UNIT/ML injection SMARTSIG:60 Unit(s) SUB-Q Twice Daily   sacubitril-valsartan (ENTRESTO) 49-51 MG Take 1 tablet by mouth 2 (two) times daily.   [DISCONTINUED] hydrALAZINE (APRESOLINE)  50 MG tablet TAKE 1 TABLET BY MOUTH THREE TIMES DAILY     PAST MEDICAL HISTORY: Past Medical History:  Diagnosis Date   CHF (congestive heart failure) (HCC)    Chronic kidney disease    Coronary artery disease    Diabetes mellitus without complication ([place])    Hyperlipidemia    Hypertension     PAST SURGICAL HISTORY: Past Surgical History:  Procedure Laterality Date    BREAST EXCISIONAL BIOPSY Right    HEMORRHOID SURGERY     MENISCUS REPAIR     THYROID SURGERY     VAGINAL HYSTERECTOMY      FAMILY HISTORY: The patient family history is not on file. She was adopted.  SOCIAL HISTORY:  The patient  reports that she has never smoked. She has never used smokeless tobacco. She reports that she does not drink alcohol and does not use drugs.  REVIEW OF SYSTEMS: Review of Systems  Constitutional: Positive for malaise/fatigue (chronic). Negative for chills and fever.  HENT:  Negative for hoarse voice and nosebleeds.   Eyes:  Negative for discharge, double vision and pain.  Cardiovascular:  Positive for dyspnea on exertion. Negative for chest pain, claudication, leg swelling, near-syncope, orthopnea, palpitations, paroxysmal nocturnal dyspnea and syncope.  Respiratory:  Positive for shortness of breath. Negative for hemoptysis.   Musculoskeletal:  Negative for muscle cramps and myalgias.  Gastrointestinal:  Negative for abdominal pain, constipation, diarrhea, hematemesis, hematochezia, melena, nausea and vomiting.  Neurological:  Negative for dizziness and light-headedness.   PHYSICAL EXAM: Vitals with BMI 11/28/2020 11/28/2020 10/21/2020  Height - 5\' 2"  -  Weight - 213 lbs 13 oz -  BMI - 44.01 -  Systolic 027 253 664  Diastolic 74 59 85  Pulse 88 88 85   CONSTITUTIONAL: Age-appropriate female, hemodynamically stable, no acute distress.    SKIN: Skin is warm and dry. No rash noted. No cyanosis. No pallor. No jaundice HEAD: Normocephalic and atraumatic.  EYES: No scleral icterus MOUTH/THROAT: Moist oral membranes.  NECK: Short neck, increased adipose tissue, no JVD present. No thyromegaly noted. No carotid bruits  LYMPHATIC: No visible cervical adenopathy.  CHEST Normal respiratory effort. No intercostal retractions  LUNGS: Clear to auscultation bilaterally.  No stridor. No wheezes. No rales.  CARDIOVASCULAR: Regular rate and rhythm, positive S1-S2,  soft holosystolic murmur heard at the apex, no gallops or rubs ABDOMINAL: Obese, nontender, nondistended, positive bowel sounds in all 4 quadrants, no apparent ascites.  EXTREMITIES: No peripheral edema,  2+ bilateral posterior tibial pulses. HEMATOLOGIC: No significant bruising NEUROLOGIC: Oriented to person, place, and time. Nonfocal. Normal muscle tone.  PSYCHIATRIC: Normal mood and affect. Normal behavior. Cooperative  RADIOLOGY:  VQ Scan:  04/28/2020: Negative perfusion lung scan for pulmonary embolism.  CARDIAC DATABASE: EKG: 10/19/2020: Normal sinus rhythm, 79 bpm, left axis, left bundle branch block.  No significant change compared to prior ECG.  Echocardiogram: 05/04/2020: Normal LV systolic function with visual EF 50-55%. Left ventricle cavity is normal in size. Severe concentric hypertrophy of the left ventricle. Normal global wall motion. Unable to evaluate diastolic function due to mitral annular calcification. Elevated LAP.  Native mitral valve. Posterior annular calcification. No evidence of mitral stenosis. Mild (Grade I) mitral regurgitation. Mild tricuspid regurgitation. No evidence of pulmonary hypertension. RVSP measures 32 mmHg. No prior studies available for comparison.   Stress Testing: Lexiscan Tetrofosmin stress test 05/11/2020: Lexiscan nuclear stress test performed using 1-day protocol. SPECT images show uniform breast tissue attenuation in both rest and stress images. In  addition, there is medium sized, medium intensity, fixed perfusion defect in basal inferior/inferoseptal myocardium. All segments of left ventricle demonstrated septal dyskinesis, global hypokinesis. Stress LVEF 34%. High risk study due to low stress LVEF.  Heart Catheterization: 10/10/2010: HEMODYNAMIC DATA:  Right heart catheterization:  RA pressure 7/7, mean 6 mmHg.  RA saturation 76%.   RV pressure 84/1, end-diastolic pressure 4 mmHg.   PA pressure 27/40 with a mean of 21 mmHg.  PA  saturation was 75%.   Pulmonary capillary wedge 6/5 with a mean of 3 mmHg.  Aortic saturation was 100%.   Cardiac output was 6.4 with a cardiac index of 3.34 by Fick.   Right coronary artery:  Right coronary artery is a large caliber and superdominant vessel.  Gives origin to large PL branch and 2 PDA large branches.  Smooth and normal.   Left main coronary artery:  Left main coronary artery is a large-caliber vessel, which is smooth and normal.   Circumflex coronary artery:  Circumflex coronary artery is a very large- caliber vessel.  Gives origin to a moderate-sized obtuse marginal 1 and continues distally as a large obtuse marginal 2.  Smooth and normal.   LAD:  LAD is a large-caliber vessel in the proximal segment.  Gives origin to a moderate-sized diagonal 1 and then gives origin to a very large diagonal 2, which is bigger than the LAD itself.  Between diagonal 1 and diagonal 2, there is a eccentric 50% stenosis noted in the LAD. The diagonal 2, which is very large has a ostial 30% stenosis. Otherwise, except for this focal stenosis of 50%, there is no other lesion noted in the LAD or the large diagonal 2.  LABORATORY DATA: No flowsheet data found.  CMP Latest Ref Rng & Units 11/24/2020 10/13/2020 08/23/2020  Glucose 70 - 99 mg/dL 136(H) 161(H) 76  BUN 8 - 27 mg/dL 18 27 24   Creatinine 0.57 - 1.00 mg/dL 1.49(H) 1.48(H) 1.56(H)  Sodium 134 - 144 mmol/L 144 142 141  Potassium 3.5 - 5.2 mmol/L 4.5 4.9 5.0  Chloride 96 - 106 mmol/L 104 103 104  CO2 20 - 29 mmol/L 25 24 26   Calcium 8.7 - 10.3 mg/dL 8.5(L) 9.0 9.5    Lipid Panel  No results found for: CHOL, TRIG, HDL, CHOLHDL, VLDL, LDLCALC, LDLDIRECT, LABVLDL  No components found for: NTPROBNP Recent Labs    04/27/20 1358 06/24/20 1152 08/01/20 1502 08/11/20 1333 08/23/20 1142 10/13/20 1323 11/24/20 1313  PROBNP 1,000* 641 2,678* 1,311* 1,210* 1,802* 2,066*   No results for input(s): TSH in the last 8760  hours.  BMP Recent Labs    08/23/20 1142 10/13/20 1320 11/24/20 1313  NA 141 142 144  K 5.0 4.9 4.5  CL 104 103 104  CO2 26 24 25   GLUCOSE 76 161* 136*  BUN 24 27 18   CREATININE 1.56* 1.48* 1.49*  CALCIUM 9.5 9.0 8.5*    HEMOGLOBIN A1C No results found for: HGBA1C, MPG  External labs: 09/07/2019:  Total cholesterol 147, triglycerides 186, HDL 27, LDL 83, non-HDL 120 TSH 2.15 Hemoglobin A1c 6.5 White count 11.75, hemoglobin 11.1 g/dL, hematocrit 34.3%  IMPRESSION:    ICD-10-CM   1. Chronic heart failure with preserved ejection fraction (HCC)  I50.32 hydrALAZINE (APRESOLINE) 25 MG tablet    2. Nonobstructive atherosclerosis of coronary artery  I25.10     3. Dyspnea on exertion  R06.09     4. Syncope and collapse  R55     5. Type 2  diabetes mellitus with hyperglycemia, with long-term current use of insulin (HCC)  E11.65    Z79.4     6. Long-term insulin use (HCC)  Z79.4     7. LBBB (left bundle branch block)  I44.7     8. Mixed hyperlipidemia  E78.2     9. Benign hypertension  I10 hydrALAZINE (APRESOLINE) 25 MG tablet    10. History of COVID-19  Z86.16     11. Class 2 severe obesity due to excess calories with serious comorbidity and body mass index (BMI) of 38.0 to 38.9 in adult ([place])  E66.01    Z68.38     12. Hypomagnesemia  E83.42 Magnesium Oxide 400 MG CAPS       RECOMMENDATIONS: Connie Swanson is a 80 y.o. female whose past medical history and cardiac risk factors include: Single-vessel CAD (nonobstructive), insulin-dependent diabetes mellitus type 2, chronic kidney disease, hypertension, history of ischemic colitis, hypothyroidism, obesity due to excess calories, vertigo, history of COVID-19 infection (January 2022), postmenopausal female, advanced age.  Chronic heart failure with preserved EF, NYHA class II: Appears euvolemic on physical examination. Medications reconciled. Reduce the dose of Imdur as discussed above. Continue using Bumex on as needed  basis. Discontinue hydralazine 50 mg p.o. 3 times daily.  We will reduce the dose of hydralazine to 25 mg p.o. 3 times daily. If blood pressure and hemodynamics allow we will consider initiation of Farxiga at the next visit.  Dyspnea on exertion /chronic HFpEF/nonobstructive CAD Chronic  Work-up thus far includes: VQ scan, echo, and nuclear stress test.  Results reviewed and noted above for further reference. In the past noted to have single-vessel CAD back in 2012. Has undergone a nuclear stress test in the recent past which was reported to be high risk due to fixed perfusion defect, septal dyskinesis, and reduced LVEF per gated SPECT.  Septal dyskinesia most likely secondary to bundle branch block and echocardiogram notes preserved LVEF.  Given her renal function and no identifiable reversible ischemia the shared decision was to hold off on angiography. We discussed considering left and right heart catheterization given her dyspnea; however, patient would like to hold off for now.  Hypomagnesemia: Magnesium oxide 400 3 times daily  Syncope: Secondary to her orthostatic hypotension and low fluid and oral intakes No recurrent events since last office visit.  Will decrease hydralazine to 25mg  po tid.  Currently uses Bumex on as needed basis.  Benign essential hypertension: Slightly elevated but within acceptable range. Will reduce the dose of hydralazine so that Wilder Glade can be initiated at the next visit. Patient is no longer experiencing orthostatic hypotension. Monitor for now.  Insulin-dependent diabetes mellitus type 2: Most recent hemoglobin A1c within acceptable range. Educated on the importance of glycemic control. Currently managed by primary care provider.  Obesity, due to excess calories: Body mass index is 39.1 kg/m. I reviewed with the patient the importance of diet, regular physical activity/exercise, weight loss.   Patient is educated on increasing physical activity  gradually as tolerated.  With the goal of moderate intensity exercise for 30 minutes a day 5 days a week.   FINAL MEDICATION LIST END OF ENCOUNTER: Meds ordered this encounter  Medications   Magnesium Oxide 400 MG CAPS    Sig: Take 1 capsule (400 mg total) by mouth in the morning, at noon, and at bedtime.    Dispense:  270 capsule    Refill:  0   hydrALAZINE (APRESOLINE) 25 MG tablet    Sig: Take  1 tablet (25 mg total) by mouth 3 (three) times daily.    Dispense:  90 tablet    Refill:  0     Medications Discontinued During This Encounter  Medication Reason   hydrALAZINE (APRESOLINE) 50 MG tablet      Current Outpatient Medications:    allopurinol (ZYLOPRIM) 300 MG tablet, Take 300 mg by mouth daily., Disp: , Rfl:    atorvastatin (LIPITOR) 40 MG tablet, Take 40 mg by mouth daily., Disp: , Rfl:    B-D INS SYR ULTRAFINE 1CC/30G 30G X 1/2" 1 ML MISC, USE SYRINGE AS DIRECTED TWICE DAILY, Disp: , Rfl:    bumetanide (BUMEX) 0.5 MG tablet, Take 1 tablet (0.5 mg total) by mouth as needed (Weight gain >1lb over 24hr or >3lb over 7 days.)., Disp: 30 tablet, Rfl: 1   carvedilol (COREG) 25 MG tablet, Take 25 mg by mouth 2 (two) times daily., Disp: , Rfl:    denosumab (PROLIA) 60 MG/ML SOSY injection, Inject 60 mg into the skin every 6 (six) months., Disp: , Rfl:    isosorbide mononitrate (IMDUR) 30 MG 24 hr tablet, Take 0.5 tablets (15 mg total) by mouth daily., Disp: 15 tablet, Rfl: 0   Magnesium Oxide 400 MG CAPS, Take 1 capsule (400 mg total) by mouth in the morning, at noon, and at bedtime., Disp: 270 capsule, Rfl: 0   metFORMIN (GLUCOPHAGE) 1000 MG tablet, Take 1,000 mg by mouth 2 (two) times daily with a meal., Disp: , Rfl:    NOVOLIN 70/30 RELION (70-30) 100 UNIT/ML injection, SMARTSIG:60 Unit(s) SUB-Q Twice Daily, Disp: , Rfl:    sacubitril-valsartan (ENTRESTO) 49-51 MG, Take 1 tablet by mouth 2 (two) times daily., Disp: , Rfl:    hydrALAZINE (APRESOLINE) 25 MG tablet, Take 1 tablet (25  mg total) by mouth 3 (three) times daily., Disp: 90 tablet, Rfl: 0  No orders of the defined types were placed in this encounter.  There are no Patient Instructions on file for this visit.   --Continue cardiac medications as reconciled in final medication list. --Return in about 7 weeks (around 01/16/2021) for Follow up, Dyspnea, heart failure management.. Or sooner if needed. --Continue follow-up with your primary care physician regarding the management of your other chronic comorbid conditions.  Patient's questions and concerns were addressed to her satisfaction. She voices understanding of the instructions provided during this encounter.   This note was created using a voice recognition software as a result there may be grammatical errors inadvertently enclosed that do not reflect the nature of this encounter. Every attempt is made to correct such errors.   Rex Kras, [place], [facility]  Pager: [phone] Office: [phone]

## 2020-11-28 ENCOUNTER — Other Ambulatory Visit: Payer: Self-pay

## 2020-11-28 ENCOUNTER — Ambulatory Visit: Payer: Medicare Other | Admitting: Cardiology

## 2020-11-28 ENCOUNTER — Encounter: Payer: Self-pay | Admitting: Cardiology

## 2020-11-28 VITALS — BP 144/74 | HR 88 | Temp 97.6°F | Resp 17 | Ht 62.0 in | Wt 213.8 lb

## 2020-11-28 DIAGNOSIS — E782 Mixed hyperlipidemia: Secondary | ICD-10-CM

## 2020-11-28 DIAGNOSIS — I5032 Chronic diastolic (congestive) heart failure: Secondary | ICD-10-CM | POA: Diagnosis not present

## 2020-11-28 DIAGNOSIS — I447 Left bundle-branch block, unspecified: Secondary | ICD-10-CM

## 2020-11-28 DIAGNOSIS — Z8616 Personal history of COVID-19: Secondary | ICD-10-CM

## 2020-11-28 DIAGNOSIS — I10 Essential (primary) hypertension: Secondary | ICD-10-CM

## 2020-11-28 DIAGNOSIS — R0609 Other forms of dyspnea: Secondary | ICD-10-CM | POA: Diagnosis not present

## 2020-11-28 DIAGNOSIS — E1165 Type 2 diabetes mellitus with hyperglycemia: Secondary | ICD-10-CM

## 2020-11-28 DIAGNOSIS — R55 Syncope and collapse: Secondary | ICD-10-CM

## 2020-11-28 DIAGNOSIS — Z794 Long term (current) use of insulin: Secondary | ICD-10-CM

## 2020-11-28 DIAGNOSIS — I2510 Atherosclerotic heart disease of native coronary artery without angina pectoris: Secondary | ICD-10-CM

## 2020-11-28 DIAGNOSIS — Z6838 Body mass index (BMI) 38.0-38.9, adult: Secondary | ICD-10-CM

## 2020-11-28 MED ORDER — HYDRALAZINE HCL 25 MG PO TABS: 25.0000 mg | ORAL_TABLET | Freq: Three times a day (TID) | ORAL | 0 refills | Status: DC

## 2020-11-28 MED ORDER — MAGNESIUM OXIDE 400 MG PO CAPS: 400.0000 mg | ORAL_CAPSULE | Freq: Three times a day (TID) | ORAL | 0 refills | Status: DC

## 2020-12-06 ENCOUNTER — Other Ambulatory Visit: Payer: Self-pay

## 2020-12-06 ENCOUNTER — Telehealth: Payer: Self-pay

## 2020-12-06 DIAGNOSIS — I5032 Chronic diastolic (congestive) heart failure: Secondary | ICD-10-CM

## 2020-12-06 MED ORDER — DAPAGLIFLOZIN PROPANEDIOL 10 MG PO TABS: 10.0000 mg | ORAL_TABLET | Freq: Every day | ORAL | 0 refills | Status: DC

## 2020-12-06 NOTE — Telephone Encounter (Signed)
Agree with using Bumex as needed like she did - great work.  Send in script for Farxiga 10mg  po qday and labs one week later.  ST

## 2020-12-06 NOTE — Telephone Encounter (Signed)
Medication has been added and MAR has be updated and orders have been placed and released patient is aware

## 2020-12-14 ENCOUNTER — Other Ambulatory Visit: Payer: Self-pay

## 2020-12-14 MED ORDER — ENTRESTO 49-51 MG PO TABS
1.0000 | ORAL_TABLET | Freq: Two times a day (BID) | ORAL | 3 refills | Status: DC
Start: 2020-12-14 — End: 2021-10-20

## 2021-01-12 DIAGNOSIS — I5032 Chronic diastolic (congestive) heart failure: Secondary | ICD-10-CM | POA: Diagnosis not present

## 2021-01-12 DIAGNOSIS — N1832 Chronic kidney disease, stage 3b: Secondary | ICD-10-CM | POA: Diagnosis not present

## 2021-01-13 LAB — BASIC METABOLIC PANEL
BUN/Creatinine Ratio: 14 (ref 12–28)
BUN: 21 mg/dL (ref 8–27)
CO2: 26 mmol/L (ref 20–29)
Calcium: 8.2 mg/dL — ABNORMAL LOW (ref 8.7–10.3)
Chloride: 103 mmol/L (ref 96–106)
Creatinine, Ser: 1.46 mg/dL — ABNORMAL HIGH (ref 0.57–1.00)
Glucose: 121 mg/dL — ABNORMAL HIGH (ref 70–99)
Potassium: 4.4 mmol/L (ref 3.5–5.2)
Sodium: 140 mmol/L (ref 134–144)
eGFR: 36 mL/min/{1.73_m2} — ABNORMAL LOW (ref >59)

## 2021-01-13 LAB — PRO B NATRIURETIC PEPTIDE: NT-Pro BNP: 1948 pg/mL — ABNORMAL HIGH (ref 0–738)

## 2021-01-15 NOTE — Progress Notes (Signed)
External Labs: Collected: 01/12/2021 Hemoglobin 10.8 g/dL, hematocrit 33.4% BUN 21, creatinine 1.41 Sodium 141, potassium 4.5, chloride 103, bicarb 27 Magnesium 1.7 Albumin 3.2

## 2021-01-16 ENCOUNTER — Other Ambulatory Visit: Payer: Self-pay

## 2021-01-16 ENCOUNTER — Encounter: Payer: Self-pay | Admitting: Cardiology

## 2021-01-16 ENCOUNTER — Ambulatory Visit: Payer: Medicare Other | Admitting: Cardiology

## 2021-01-16 VITALS — BP 127/61 | HR 74 | Temp 97.2°F | Resp 16 | Ht 62.0 in | Wt 208.0 lb

## 2021-01-16 DIAGNOSIS — I10 Essential (primary) hypertension: Secondary | ICD-10-CM

## 2021-01-16 DIAGNOSIS — I447 Left bundle-branch block, unspecified: Secondary | ICD-10-CM

## 2021-01-16 DIAGNOSIS — E1165 Type 2 diabetes mellitus with hyperglycemia: Secondary | ICD-10-CM | POA: Diagnosis not present

## 2021-01-16 DIAGNOSIS — R0609 Other forms of dyspnea: Secondary | ICD-10-CM

## 2021-01-16 DIAGNOSIS — I5032 Chronic diastolic (congestive) heart failure: Secondary | ICD-10-CM

## 2021-01-16 DIAGNOSIS — I2510 Atherosclerotic heart disease of native coronary artery without angina pectoris: Secondary | ICD-10-CM | POA: Diagnosis not present

## 2021-01-16 DIAGNOSIS — E782 Mixed hyperlipidemia: Secondary | ICD-10-CM

## 2021-01-16 DIAGNOSIS — Z8616 Personal history of COVID-19: Secondary | ICD-10-CM

## 2021-01-16 DIAGNOSIS — Z794 Long term (current) use of insulin: Secondary | ICD-10-CM

## 2021-01-16 DIAGNOSIS — Z6838 Body mass index (BMI) 38.0-38.9, adult: Secondary | ICD-10-CM

## 2021-01-16 MED ORDER — BUMETANIDE 0.5 MG PO TABS: 0.5000 mg | ORAL_TABLET | ORAL | 1 refills | Status: DC | PRN

## 2021-01-16 NOTE — Progress Notes (Signed)
Called pt no answer, phone was not in service

## 2021-01-16 NOTE — Progress Notes (Signed)
ID:  Connie Swanson, DOB September 27, 1940, MRN [id number]  PCP:  Haywood Pao, MD  Cardiologist:  Rex Kras, DO, [facility] (established care 04/27/2020) Former Cardiology Providers: Dr. Adrian Swanson   Date: 01/16/21 Last Office Visit: 11/28/2020  Chief Complaint  Patient presents with   heart failure management   Shortness of Breath   Follow-up    HPI  Connie Swanson is a 81 y.o. female who presents to the office with a chief complaint of "follow-up for heart failure." Patient's past medical history and cardiovascular risk factors include: HFpEF, single-vessel CAD (nonobstructive), insulin-dependent diabetes mellitus type 2, chronic kidney disease, hypertension, history of ischemic colitis, hypothyroidism, obesity due to excess calories, vertigo, history of COVID-19 infection (January 2022), postmenopausal female, advanced age.  She is referred to the office at the request of [name], [name], MD for evaluation of heart failure.  Patient presents today for follow-up for congestive heart failure management.  Since last office visit patient is doing well from a cardiovascular standpoint.  She is tolerated the transition from hydralazine 50 mg p.o. 3 times daily to hydralazine 25 mg p.o. 3 times daily.  She is no longer experiencing orthostatic hypotension, near-syncope or syncopal events.  Patient's weight remains relatively stable in fact she is lost 5 pounds since last office visit.  She uses Bumex on as-needed basis.  Most recent labs from 01/12/2021 reviewed independently with her.  I suspect that her renal function has now stabilized with serum creatinine being around 1.4 mg/dL.  She has an upcoming appointment with her nephrologist Dr. Posey Swanson in the upcoming 2 weeks.  Patient also continues to feel tired and fatigued and states that she is not able to sleep more than 3-4 hours/day.  She has not been officially been evaluated for sleep apnea but given her comorbidities and heart failure I suspect  that she would benefit from this.  She is agreeable to being evaluated.  Her dyspnea on exertion remains relatively stable.  In the past she had undergone nuclear stress test; however, wanted to hold off on invasive angiography due to her underlying renal function.  FUNCTIONAL STATUS: No structured exercise program or daily routine.   ALLERGIES: No Known Allergies  MEDICATION LIST PRIOR TO VISIT: Current Meds  Medication Sig   allopurinol (ZYLOPRIM) 300 MG tablet Take 300 mg by mouth daily.   atorvastatin (LIPITOR) 40 MG tablet Take 40 mg by mouth daily.   B-D INS SYR ULTRAFINE 1CC/30G 30G X 1/2" 1 ML MISC USE SYRINGE AS DIRECTED TWICE DAILY   carvedilol (COREG) 25 MG tablet Take 25 mg by mouth 2 (two) times daily.   denosumab (PROLIA) 60 MG/ML SOSY injection Inject 60 mg into the skin every 6 (six) months.   hydrALAZINE (APRESOLINE) 25 MG tablet Take 1 tablet (25 mg total) by mouth 3 (three) times daily.   isosorbide mononitrate (IMDUR) 30 MG 24 hr tablet Take 0.5 tablets (15 mg total) by mouth daily. (Patient taking differently: Take 30 mg by mouth daily.)   metFORMIN (GLUCOPHAGE) 1000 MG tablet Take 1,000 mg by mouth 2 (two) times daily with a meal.   NOVOLIN 70/30 RELION (70-30) 100 UNIT/ML injection SMARTSIG:60 Unit(s) SUB-Q Twice Daily   sacubitril-valsartan (ENTRESTO) 49-51 MG Take 1 tablet by mouth 2 (two) times daily.     PAST MEDICAL HISTORY: Past Medical History:  Diagnosis Date   CHF (congestive heart failure) (HCC)    Chronic kidney disease    Coronary artery disease  Diabetes mellitus without complication ([place])    Hyperlipidemia    Hypertension     PAST SURGICAL HISTORY: Past Surgical History:  Procedure Laterality Date   BREAST EXCISIONAL BIOPSY Right    HEMORRHOID SURGERY     MENISCUS REPAIR     THYROID SURGERY     VAGINAL HYSTERECTOMY      FAMILY HISTORY: The patient family history is not on file. She was adopted.  SOCIAL HISTORY:  The patient   reports that she has never smoked. She has never used smokeless tobacco. She reports that she does not drink alcohol and does not use drugs.  REVIEW OF SYSTEMS: Review of Systems  Constitutional: Positive for malaise/fatigue (chronic). Negative for chills and fever.  HENT:  Negative for hoarse voice and nosebleeds.   Eyes:  Negative for discharge, double vision and pain.  Cardiovascular:  Positive for dyspnea on exertion. Negative for chest pain, claudication, leg swelling, near-syncope, orthopnea, palpitations, paroxysmal nocturnal dyspnea and syncope.  Respiratory:  Positive for shortness of breath. Negative for hemoptysis.   Musculoskeletal:  Negative for muscle cramps and myalgias.  Gastrointestinal:  Negative for abdominal pain, constipation, diarrhea, hematemesis, hematochezia, melena, nausea and vomiting.  Neurological:  Negative for dizziness and light-headedness.   PHYSICAL EXAM: Vitals with BMI 01/16/2021 11/28/2020 11/28/2020  Height 5\' 2"  - 5\' 2"   Weight 208 lbs - 213 lbs 13 oz  BMI 37.62 - 83.15  Systolic 176 160 737  Diastolic 61 74 59  Pulse 74 88 88   CONSTITUTIONAL: Age-appropriate female, hemodynamically stable, no acute distress.    SKIN: Skin is warm and dry. No rash noted. No cyanosis. No pallor. No jaundice HEAD: Normocephalic and atraumatic.  EYES: No scleral icterus MOUTH/THROAT: Moist oral membranes.  NECK: Short neck, increased adipose tissue, no JVD present. No thyromegaly noted. No carotid bruits  LYMPHATIC: No visible cervical adenopathy.  CHEST Normal respiratory effort. No intercostal retractions  LUNGS: Clear to auscultation bilaterally.  No stridor. No wheezes. No rales.  CARDIOVASCULAR: Regular rate and rhythm, positive T0-G2, soft holosystolic murmur heard at the apex, no gallops or rubs ABDOMINAL: Obese, nontender, nondistended, positive bowel sounds in all 4 quadrants, no apparent ascites.  EXTREMITIES: No peripheral edema,  2+ bilateral posterior  tibial pulses. HEMATOLOGIC: No significant bruising NEUROLOGIC: Oriented to person, place, and time. Nonfocal. Normal muscle tone.  PSYCHIATRIC: Normal mood and affect. Normal behavior. Cooperative  RADIOLOGY:  VQ Scan:  04/28/2020: Negative perfusion lung scan for pulmonary embolism.  CARDIAC DATABASE: EKG: 10/19/2020: Normal sinus rhythm, 79 bpm, left axis, left bundle branch block.  No significant change compared to prior ECG.  Echocardiogram: 05/04/2020: Normal LV systolic function with visual EF 50-55%. Left ventricle cavity is normal in size. Severe concentric hypertrophy of the left ventricle. Normal global wall motion. Unable to evaluate diastolic function due to mitral annular calcification. Elevated LAP.  Native mitral valve. Posterior annular calcification. No evidence of mitral stenosis. Mild (Grade I) mitral regurgitation. Mild tricuspid regurgitation. No evidence of pulmonary hypertension. RVSP measures 32 mmHg. No prior studies available for comparison.   Stress Testing: Lexiscan Tetrofosmin stress test 05/11/2020: Lexiscan nuclear stress test performed using 1-day protocol. SPECT images show uniform breast tissue attenuation in both rest and stress images. In addition, there is medium sized, medium intensity, fixed perfusion defect in basal inferior/inferoseptal myocardium. All segments of left ventricle demonstrated septal dyskinesis, global hypokinesis. Stress LVEF 34%. High risk study due to low stress LVEF.  Heart Catheterization: 10/10/2010: HEMODYNAMIC DATA:  Right heart  catheterization:  RA pressure 7/7, mean 6 mmHg.  RA saturation 76%.   RV pressure 50/3, end-diastolic pressure 4 mmHg.   PA pressure 27/40 with a mean of 21 mmHg.  PA saturation was 75%.   Pulmonary capillary wedge 6/5 with a mean of 3 mmHg.  Aortic saturation was 100%.   Cardiac output was 6.4 with a cardiac index of 3.34 by Fick.   Right coronary artery:  Right coronary artery is a large  caliber and superdominant vessel.  Gives origin to large PL branch and 2 PDA large branches.  Smooth and normal.   Left main coronary artery:  Left main coronary artery is a large-caliber vessel, which is smooth and normal.   Circumflex coronary artery:  Circumflex coronary artery is a very large- caliber vessel.  Gives origin to a moderate-sized obtuse marginal 1 and continues distally as a large obtuse marginal 2.  Smooth and normal.   LAD:  LAD is a large-caliber vessel in the proximal segment.  Gives origin to a moderate-sized diagonal 1 and then gives origin to a very large diagonal 2, which is bigger than the LAD itself.  Between diagonal 1 and diagonal 2, there is a eccentric 50% stenosis noted in the LAD. The diagonal 2, which is very large has a ostial 30% stenosis. Otherwise, except for this focal stenosis of 50%, there is no other lesion noted in the LAD or the large diagonal 2.  LABORATORY DATA: No flowsheet data found.  CMP Latest Ref Rng & Units 01/12/2021 11/24/2020 10/13/2020  Glucose 70 - 99 mg/dL 121(H) 136(H) 161(H)  BUN 8 - 27 mg/dL 21 18 27   Creatinine 0.57 - 1.00 mg/dL 1.46(H) 1.49(H) 1.48(H)  Sodium 134 - 144 mmol/L 140 144 142  Potassium 3.5 - 5.2 mmol/L 4.4 4.5 4.9  Chloride 96 - 106 mmol/L 103 104 103  CO2 20 - 29 mmol/L 26 25 24   Calcium 8.7 - 10.3 mg/dL 8.2(L) 8.5(L) 9.0    Lipid Panel  No results found for: CHOL, TRIG, HDL, CHOLHDL, VLDL, LDLCALC, LDLDIRECT, LABVLDL  No components found for: [place]    04/27/20 1358 06/24/20 1152 08/01/20 1502 08/11/20 1333 08/23/20 1142 10/13/20 1323 11/24/20 1313 01/12/21 1316  PROBNP 1,000* 641 2,678* 1,311* 1,210* 1,802* 2,066* 1,948*   No results for input(s): TSH in the last 8760 hours.  BMP Recent Labs    10/13/20 1320 11/24/20 1313 01/12/21 1317  NA 142 144 140  K 4.9 4.5 4.4  CL 103 104 103  CO2 24 25 26   GLUCOSE 161* 136* 121*  BUN 27 18 21   CREATININE 1.48* 1.49* 1.46*   CALCIUM 9.0 8.5* 8.2*    HEMOGLOBIN A1C No results found for: HGBA1C, MPG  External labs: 09/07/2019:  Total cholesterol 147, triglycerides 186, HDL 27, LDL 83, non-HDL 120 TSH 2.15 Hemoglobin A1c 6.5 White count 11.75, hemoglobin 11.1 g/dL, hematocrit 34.3%  IMPRESSION:    ICD-10-CM   1. Chronic heart failure with preserved ejection fraction (HCC)  I50.32 bumetanide (BUMEX) 0.5 MG tablet    2. Nonobstructive atherosclerosis of coronary artery  I25.10     3. Dyspnea on exertion  R06.09     4. Type 2 diabetes mellitus with hyperglycemia, with long-term current use of insulin (HCC)  E11.65    Z79.4     5. Long-term insulin use (HCC)  Z79.4     6. LBBB (left bundle branch block)  I44.7     7. Mixed hyperlipidemia  E78.2  8. Benign hypertension  I10     9. History of COVID-19  Z86.16     10. Class 2 severe obesity due to excess calories with serious comorbidity and body mass index (BMI) of 38.0 to 38.9 in adult [facility])  E66.01    Z68.38         RECOMMENDATIONS: DAVITA SUBLETT is a 81 y.o. female whose past medical history and cardiac risk factors include: Single-vessel CAD (nonobstructive), insulin-dependent diabetes mellitus type 2, chronic kidney disease, hypertension, history of ischemic colitis, hypothyroidism, obesity due to excess calories, vertigo, history of COVID-19 infection (January 2022), postmenopausal female, advanced age.  Chronic heart failure with preserved EF, NYHA class II: Euvolemic. Has lost 5 pounds since last office visit. NT proBNP slightly trending downward compared to prior levels. Patient was supposed to be started on Farxiga; however, this is still pending due to it being cost prohibitive. Patient assistance form has been filled out for [place] at today's office visit. I do not have any samples for Farxiga to provide her at today's visit.  I have asked her to call us back in the next couple weeks.  Would recommend starting Farxiga 10 mg p.o.  daily with blood work in 1 week to evaluate kidney function and electrolytes.  Labs still need to be ordered. Medications reconciled. Continue using Bumex on as needed basis. Labs from 01/12/2021 independently reviewed with the patient at today's office visit. Will refer to sleep medicine for evaluation of sleep apnea given her comorbid conditions, feeling tired and fatigue.  Dyspnea on exertion /chronic HFpEF/nonobstructive CAD Chronic  Work-up thus far includes: VQ scan, echo, and nuclear stress test.  Results reviewed and noted above for further reference. In the past noted to have single-vessel CAD back in 2012. Has undergone a nuclear stress test in the recent past which was reported to be high risk due to fixed perfusion defect, septal dyskinesis, and reduced LVEF per gated SPECT.  Septal dyskinesia most likely secondary to bundle branch block and echocardiogram notes preserved LVEF.  Given her renal function and no identifiable reversible ischemia the shared decision was to hold off on angiography. We discussed considering left and right heart catheterization given her dyspnea; however, patient would like to hold off for now.  Syncope: Secondary to her orthostatic hypotension and low fluid and oral intakes No recurrent events since last 2 office visit.  Since her initial event hydralazine has been reduced and Bumex is now on as needed basis.  Benign essential hypertension: Significantly improved compared to prior visits. Medications reconciled. We emphasized the importance of a low-salt diet. Monitor for now.  Insulin-dependent diabetes mellitus type 2: Most recent hemoglobin A1c within acceptable range. Educated on the importance of glycemic control. Currently managed by primary care provider.  Obesity, due to excess calories: Body mass index is 38.04 kg/m. I reviewed with the patient the importance of diet, regular physical activity/exercise, weight loss.   Patient is educated  on increasing physical activity gradually as tolerated.  With the goal of moderate intensity exercise for 30 minutes a day 5 days a week.   FINAL MEDICATION LIST END OF ENCOUNTER: Meds ordered this encounter  Medications   bumetanide (BUMEX) 0.5 MG tablet    Sig: Take 1 tablet (0.5 mg total) by mouth as needed (Weight gain >1lb over 24hr or >3lb over 7 days.).    Dispense:  30 tablet    Refill:  1     Medications Discontinued During This Encounter  Medication  Reason   Magnesium Oxide 400 MG CAPS    bumetanide (BUMEX) 0.5 MG tablet      Current Outpatient Medications:    allopurinol (ZYLOPRIM) 300 MG tablet, Take 300 mg by mouth daily., Disp: , Rfl:    atorvastatin (LIPITOR) 40 MG tablet, Take 40 mg by mouth daily., Disp: , Rfl:    B-D INS SYR ULTRAFINE 1CC/30G 30G X 1/2" 1 ML MISC, USE SYRINGE AS DIRECTED TWICE DAILY, Disp: , Rfl:    carvedilol (COREG) 25 MG tablet, Take 25 mg by mouth 2 (two) times daily., Disp: , Rfl:    denosumab (PROLIA) 60 MG/ML SOSY injection, Inject 60 mg into the skin every 6 (six) months., Disp: , Rfl:    hydrALAZINE (APRESOLINE) 25 MG tablet, Take 1 tablet (25 mg total) by mouth 3 (three) times daily., Disp: 90 tablet, Rfl: 0   isosorbide mononitrate (IMDUR) 30 MG 24 hr tablet, Take 0.5 tablets (15 mg total) by mouth daily. (Patient taking differently: Take 30 mg by mouth daily.), Disp: 15 tablet, Rfl: 0   metFORMIN (GLUCOPHAGE) 1000 MG tablet, Take 1,000 mg by mouth 2 (two) times daily with a meal., Disp: , Rfl:    NOVOLIN 70/30 RELION (70-30) 100 UNIT/ML injection, SMARTSIG:60 Unit(s) SUB-Q Twice Daily, Disp: , Rfl:    sacubitril-valsartan (ENTRESTO) 49-51 MG, Take 1 tablet by mouth 2 (two) times daily., Disp: 180 tablet, Rfl: 3   bumetanide (BUMEX) 0.5 MG tablet, Take 1 tablet (0.5 mg total) by mouth as needed (Weight gain >1lb over 24hr or >3lb over 7 days.)., Disp: 30 tablet, Rfl: 1   dapagliflozin propanediol (FARXIGA) 10 MG TABS tablet, Take 1 tablet  (10 mg total) by mouth daily. (Patient not taking: Reported on 01/16/2021), Disp: 30 tablet, Rfl: 0  No orders of the defined types were placed in this encounter.   There are no Patient Instructions on file for this visit.   --Continue cardiac medications as reconciled in final medication list. --Return in about 3 months (around 04/16/2021) for Follow up, heart failure management.. Or sooner if needed. --Continue follow-up with your primary care physician regarding the management of your other chronic comorbid conditions.  Patient's questions and concerns were addressed to her satisfaction. She voices understanding of the instructions provided during this encounter.   This note was created using a voice recognition software as a result there may be grammatical errors inadvertently enclosed that do not reflect the nature of this encounter. Every attempt is made to correct such errors.   Rex Kras, [place], [facility]  Pager: [phone] Office: [phone]

## 2021-01-17 NOTE — Progress Notes (Signed)
Attempted to call pt, phone is not in service.

## 2021-01-18 NOTE — Progress Notes (Signed)
Patient came into the office results were discussed in ov

## 2021-01-24 ENCOUNTER — Telehealth: Payer: Self-pay

## 2021-01-25 NOTE — Telephone Encounter (Signed)
Yes, please have her hold [place].   Dr. Terri Skains

## 2021-01-26 NOTE — Telephone Encounter (Signed)
Spoke to patient she is aware MAR has been updated

## 2021-02-01 ENCOUNTER — Telehealth: Payer: Self-pay

## 2021-02-01 ENCOUNTER — Ambulatory Visit: Payer: Medicare Other | Admitting: Podiatry

## 2021-02-01 ENCOUNTER — Encounter: Payer: Self-pay | Admitting: Podiatry

## 2021-02-01 ENCOUNTER — Other Ambulatory Visit: Payer: Self-pay

## 2021-02-01 DIAGNOSIS — D631 Anemia in chronic kidney disease: Secondary | ICD-10-CM | POA: Diagnosis not present

## 2021-02-01 DIAGNOSIS — M79674 Pain in right toe(s): Secondary | ICD-10-CM

## 2021-02-01 DIAGNOSIS — E119 Type 2 diabetes mellitus without complications: Secondary | ICD-10-CM

## 2021-02-01 DIAGNOSIS — M79675 Pain in left toe(s): Secondary | ICD-10-CM | POA: Diagnosis not present

## 2021-02-01 DIAGNOSIS — I129 Hypertensive chronic kidney disease with stage 1 through stage 4 chronic kidney disease, or unspecified chronic kidney disease: Secondary | ICD-10-CM | POA: Diagnosis not present

## 2021-02-01 DIAGNOSIS — B351 Tinea unguium: Secondary | ICD-10-CM | POA: Diagnosis not present

## 2021-02-01 DIAGNOSIS — N2581 Secondary hyperparathyroidism of renal origin: Secondary | ICD-10-CM | POA: Diagnosis not present

## 2021-02-01 DIAGNOSIS — N1832 Chronic kidney disease, stage 3b: Secondary | ICD-10-CM | POA: Diagnosis not present

## 2021-02-01 NOTE — Progress Notes (Signed)
This patient returns to my office for at risk foot care.  This patient requires this care by a professional since this patient will be at risk due to having diabetes and chronic kidney disease   This patient is unable to cut nails herself since the patient cannot reach her nails.These nails are painful walking and wearing shoes.  This patient presents for at risk foot care today.  General Appearance  Alert, conversant and in no acute stress.  Vascular  Dorsalis pedis and posterior tibial  pulses are palpable  bilaterally.  Capillary return is within normal limits  bilaterally. Temperature is within normal limits  bilaterally.  Neurologic  Senn-Weinstein monofilament wire test within normal limits  bilaterally. Muscle power within normal limits bilaterally.  Nails Thick disfigured discolored nails with subungual debris  Hallux nails  bilaterally. No evidence of bacterial infection or drainage bilaterally.  Orthopedic  No limitations of motion  feet .  No crepitus or effusions noted.  No bony pathology or digital deformities noted.Mild  HAV  B/L.  Skin  normotropic skin with no porokeratosis noted bilaterally.  No signs of infections or ulcers noted.     Onychomycosis  Pain in right toes  Pain in left toes  Consent was obtained for treatment procedures.   Mechanical debridement of nails 1-5  bilaterally performed with a nail nipper.  Filed with dremel without incident.    Return office visit  10 weeks                   Told patient to return for periodic foot care and evaluation due to potential at risk complications.   Gardiner Barefoot DPM

## 2021-02-02 NOTE — Telephone Encounter (Signed)
Yes, Connie Swanson is fine as well.   ST

## 2021-02-02 NOTE — Telephone Encounter (Signed)
Tried calling patient no answer left a vm

## 2021-02-03 ENCOUNTER — Telehealth: Payer: Self-pay | Admitting: Cardiology

## 2021-02-03 NOTE — Telephone Encounter (Signed)
Spoke to patient she is aware

## 2021-02-03 NOTE — Telephone Encounter (Signed)
Pt req a call from [facility] regarding a new medication her PCP put her on and how Dr. Terri Skains feels about the medication

## 2021-02-23 ENCOUNTER — Other Ambulatory Visit: Payer: Self-pay

## 2021-02-23 DIAGNOSIS — I10 Essential (primary) hypertension: Secondary | ICD-10-CM

## 2021-02-23 DIAGNOSIS — I5032 Chronic diastolic (congestive) heart failure: Secondary | ICD-10-CM

## 2021-02-23 MED ORDER — ISOSORBIDE MONONITRATE ER 30 MG PO TB24: 15.0000 mg | ORAL_TABLET | Freq: Every day | ORAL | 0 refills | Status: DC

## 2021-03-19 ENCOUNTER — Other Ambulatory Visit: Payer: Self-pay | Admitting: Cardiology

## 2021-03-19 DIAGNOSIS — I5032 Chronic diastolic (congestive) heart failure: Secondary | ICD-10-CM

## 2021-03-19 DIAGNOSIS — I10 Essential (primary) hypertension: Secondary | ICD-10-CM

## 2021-03-26 ENCOUNTER — Other Ambulatory Visit: Payer: Self-pay | Admitting: Cardiology

## 2021-03-26 DIAGNOSIS — I5032 Chronic diastolic (congestive) heart failure: Secondary | ICD-10-CM

## 2021-03-26 DIAGNOSIS — I10 Essential (primary) hypertension: Secondary | ICD-10-CM

## 2021-04-05 DIAGNOSIS — Z1331 Encounter for screening for depression: Secondary | ICD-10-CM | POA: Diagnosis not present

## 2021-04-05 DIAGNOSIS — Z794 Long term (current) use of insulin: Secondary | ICD-10-CM | POA: Diagnosis not present

## 2021-04-05 DIAGNOSIS — E1139 Type 2 diabetes mellitus with other diabetic ophthalmic complication: Secondary | ICD-10-CM | POA: Diagnosis not present

## 2021-04-05 DIAGNOSIS — E1129 Type 2 diabetes mellitus with other diabetic kidney complication: Secondary | ICD-10-CM | POA: Diagnosis not present

## 2021-04-05 DIAGNOSIS — I130 Hypertensive heart and chronic kidney disease with heart failure and stage 1 through stage 4 chronic kidney disease, or unspecified chronic kidney disease: Secondary | ICD-10-CM | POA: Diagnosis not present

## 2021-04-12 ENCOUNTER — Ambulatory Visit: Payer: Medicare Other | Admitting: Podiatry

## 2021-04-12 ENCOUNTER — Encounter: Payer: Self-pay | Admitting: Podiatry

## 2021-04-12 DIAGNOSIS — M79674 Pain in right toe(s): Secondary | ICD-10-CM | POA: Diagnosis not present

## 2021-04-12 DIAGNOSIS — M79675 Pain in left toe(s): Secondary | ICD-10-CM | POA: Diagnosis not present

## 2021-04-12 DIAGNOSIS — E119 Type 2 diabetes mellitus without complications: Secondary | ICD-10-CM | POA: Diagnosis not present

## 2021-04-12 DIAGNOSIS — B351 Tinea unguium: Secondary | ICD-10-CM

## 2021-04-12 NOTE — Progress Notes (Signed)
This patient returns to my office for at risk foot care.  This patient requires this care by a professional since this patient will be at risk due to having diabetes and chronic kidney disease   This patient is unable to cut nails herself since the patient cannot reach her nails.These nails are painful walking and wearing shoes.  This patient presents for at risk foot care today. ? ?General Appearance  Alert, conversant and in no acute stress. ? ?Vascular  Dorsalis pedis and posterior tibial  pulses are palpable  bilaterally.  Capillary return is within normal limits  bilaterally. Temperature is within normal limits  bilaterally. ? ?Neurologic  Senn-Weinstein monofilament wire test within normal limits  bilaterally. Muscle power within normal limits bilaterally. ? ?Nails Thick disfigured discolored nails with subungual debris  Hallux nails  bilaterally. No evidence of bacterial infection or drainage bilaterally. ? ?Orthopedic  No limitations of motion  feet .  No crepitus or effusions noted.  No bony pathology or digital deformities noted.Mild  HAV  B/L. ? ?Skin  normotropic skin with no porokeratosis noted bilaterally.  No signs of infections or ulcers noted.    ? ?Onychomycosis  Pain in right toes  Pain in left toes ? ?Consent was obtained for treatment procedures.   Mechanical debridement of nails 1-5  bilaterally performed with a nail nipper.  Filed with dremel without incident.  ? ? ?Return office visit  10 weeks                   Told patient to return for periodic foot care and evaluation due to potential at risk complications. ? ? ?Gardiner Barefoot DPM  ?

## 2021-04-17 ENCOUNTER — Encounter: Payer: Self-pay | Admitting: Cardiology

## 2021-04-17 ENCOUNTER — Ambulatory Visit: Payer: Medicare Other | Admitting: Cardiology

## 2021-04-17 VITALS — BP 148/72 | HR 74 | Temp 97.8°F | Resp 16 | Ht 62.0 in | Wt 201.0 lb

## 2021-04-17 DIAGNOSIS — Z8616 Personal history of COVID-19: Secondary | ICD-10-CM

## 2021-04-17 DIAGNOSIS — Z794 Long term (current) use of insulin: Secondary | ICD-10-CM

## 2021-04-17 DIAGNOSIS — E1165 Type 2 diabetes mellitus with hyperglycemia: Secondary | ICD-10-CM | POA: Diagnosis not present

## 2021-04-17 DIAGNOSIS — I5032 Chronic diastolic (congestive) heart failure: Secondary | ICD-10-CM

## 2021-04-17 DIAGNOSIS — R0609 Other forms of dyspnea: Secondary | ICD-10-CM

## 2021-04-17 DIAGNOSIS — I2510 Atherosclerotic heart disease of native coronary artery without angina pectoris: Secondary | ICD-10-CM

## 2021-04-17 DIAGNOSIS — E782 Mixed hyperlipidemia: Secondary | ICD-10-CM

## 2021-04-17 DIAGNOSIS — I447 Left bundle-branch block, unspecified: Secondary | ICD-10-CM

## 2021-04-17 DIAGNOSIS — I10 Essential (primary) hypertension: Secondary | ICD-10-CM

## 2021-04-17 MED ORDER — EMPAGLIFLOZIN 25 MG PO TABS: 25.0000 mg | ORAL_TABLET | Freq: Every day | ORAL | 0 refills | Status: AC

## 2021-04-17 NOTE — Progress Notes (Signed)
? ? ?ID:  Connie Swanson, DOB 04/04/1940, MRN [id number] ? ?PCP:  Haywood Pao, MD  ?Cardiologist:  Rex Kras, DO, [facility] (established care 04/27/2020) ?Former Cardiology Providers: Dr. Adrian Prows  ? ?Date: 04/17/21 ?Last Office Visit: 01/16/2021 ? ?Chief Complaint  ?Patient presents with  ? heart failure management  ? Follow-up  ? ? ?HPI  ?Connie Swanson is a 81 y.o. female whose past medical history and cardiovascular risk factors include: HFpEF, single-vessel CAD (nonobstructive), insulin-dependent diabetes mellitus type 2, chronic kidney disease, hypertension, history of ischemic colitis, hypothyroidism, obesity due to excess calories, vertigo, history of COVID-19 infection (January 2022), postmenopausal female, advanced age. ? ?She is referred to the office at the request of [name], [name], MD for evaluation of heart failure. ? ?Patient presents today for 43-month follow-up visit for heart failure management.  Since last office visit she is doing well from a cardiovascular standpoint.  She was recommended to start [place] however, due to insurance coverage she is currently on Jardiance.  She is on Jardiance 10 mg p.o. daily and tolerating it well without any side effects or intolerances.  She does not have any significant history of urinary tract infections/yeast infections. She is requesting a refill on Jardiance. ? ?Clinically denies any heart failure hospitalizations since last office encounter.  Her dyspnea on exertion is chronic and improving.  She uses Bumex on a as needed basis and has lost approximately 7 pounds since last office visit. ? ?Unfortunately, sustained a mechanical fall in the bathroom after walking over slippery floor.  Recovering well. ? ?FUNCTIONAL STATUS: ?No structured exercise program or daily routine.  ? ?ALLERGIES: ?No Known Allergies ? ?MEDICATION LIST PRIOR TO VISIT: ?Current Meds  ?Medication Sig  ? allopurinol (ZYLOPRIM) 300 MG tablet Take 300 mg by mouth daily.  ? atorvastatin  (LIPITOR) 40 MG tablet Take 40 mg by mouth daily.  ? B-D INS SYR ULTRAFINE 1CC/30G 30G X 1/2" 1 ML MISC USE SYRINGE AS DIRECTED TWICE DAILY  ? bumetanide (BUMEX) 0.5 MG tablet Take 1 tablet (0.5 mg total) by mouth as needed (Weight gain >1lb over 24hr or >3lb over 7 days.). (Patient taking differently: Take 0.5 mg by mouth daily as needed.)  ? calcitRIOL (ROCALTROL) 0.25 MCG capsule Take 0.25 mcg by mouth every other day. Mon, Wed, Fri  ? carvedilol (COREG) 25 MG tablet Take 25 mg by mouth 2 (two) times daily.  ? denosumab (PROLIA) 60 MG/ML SOSY injection Inject 60 mg into the skin every 6 (six) months.  ? empagliflozin (JARDIANCE) 25 MG TABS tablet Take 1 tablet (25 mg total) by mouth daily before breakfast for 7 days.  ? hydrALAZINE (APRESOLINE) 50 MG tablet TAKE 1 TABLET BY MOUTH THREE TIMES DAILY  ? isosorbide mononitrate (IMDUR) 30 MG 24 hr tablet Take 1/2 (one-half) tablet by mouth once daily  ? metFORMIN (GLUCOPHAGE) 1000 MG tablet Take 1,000 mg by mouth 2 (two) times daily with a meal.  ? NOVOLIN 70/30 RELION (70-30) 100 UNIT/ML injection SMARTSIG:60 Unit(s) SUB-Q Twice Daily  ? sacubitril-valsartan (ENTRESTO) 49-51 MG Take 1 tablet by mouth 2 (two) times daily.  ?  ? ?PAST MEDICAL HISTORY: ?Past Medical History:  ?Diagnosis Date  ? CHF (congestive heart failure) ([place])   ? Chronic kidney disease   ? Coronary artery disease   ? Diabetes mellitus without complication ([place])   ? Hyperlipidemia   ? Hypertension   ? ? ?PAST SURGICAL HISTORY: ?Past Surgical History:  ?Procedure Laterality Date  ?  BREAST EXCISIONAL BIOPSY Right   ? HEMORRHOID SURGERY    ? MENISCUS REPAIR    ? THYROID SURGERY    ? VAGINAL HYSTERECTOMY    ? ? ?FAMILY HISTORY: ?The patient family history is not on file. She was adopted. ? ?SOCIAL HISTORY:  ?The patient  reports that she has never smoked. She has never used smokeless tobacco. She reports that she does not drink alcohol and does not use drugs. ? ?REVIEW OF SYSTEMS: ?Review of Systems   ?Constitutional: Positive for malaise/fatigue (chronic-better). Negative for chills and fever.  ?HENT:  Negative for hoarse voice and nosebleeds.   ?Eyes:  Negative for discharge, double vision and pain.  ?Cardiovascular:  Positive for dyspnea on exertion (improving). Negative for chest pain, claudication, leg swelling, near-syncope, orthopnea, palpitations, paroxysmal nocturnal dyspnea and syncope.  ?Respiratory:  Positive for shortness of breath (improved). Negative for hemoptysis.   ?Musculoskeletal:  Negative for muscle cramps and myalgias.  ?Gastrointestinal:  Negative for abdominal pain, constipation, diarrhea, hematemesis, hematochezia, melena, nausea and vomiting.  ?Neurological:  Negative for dizziness and light-headedness.  ? ?PHYSICAL EXAM: ? ?  04/17/2021  ? 11:41 AM 01/16/2021  ? 11:38 AM 11/28/2020  ? 10:53 AM  ?Vitals with BMI  ?Height 5\' 2"  5\' 2"    ?Weight 201 lbs 208 lbs   ?BMI 36.75 38.03   ?Systolic 614 431 540  ?Diastolic 72 61 74  ?Pulse 74 74 88  ? ?CONSTITUTIONAL: Age-appropriate female, hemodynamically stable, no acute distress.    ?SKIN: Skin is warm and dry. No rash noted. No cyanosis. No pallor. No jaundice ?HEAD: Normocephalic and atraumatic.  ?EYES: No scleral icterus ?MOUTH/THROAT: Moist oral membranes.  ?NECK: Short neck, increased adipose tissue, no JVD present. No thyromegaly noted. No carotid bruits  ?LYMPHATIC: No visible cervical adenopathy.  ?CHEST Normal respiratory effort. No intercostal retractions  ?LUNGS: Clear to auscultation bilaterally.  No stridor. No wheezes. No rales.  ?CARDIOVASCULAR: Regular rate and rhythm, positive G8-Q7, soft holosystolic murmur heard at the apex, no gallops or rubs ?ABDOMINAL: Obese, nontender, nondistended, positive bowel sounds in all 4 quadrants, no apparent ascites.  ?EXTREMITIES: No peripheral edema,  2+ bilateral posterior tibial pulses. ?HEMATOLOGIC: No significant bruising ?NEUROLOGIC: Oriented to person, place, and time. Nonfocal. Normal  muscle tone.  ?PSYCHIATRIC: Normal mood and affect. Normal behavior. Cooperative ? ?RADIOLOGY:  ?VQ Scan:  ?04/28/2020: ?Negative perfusion lung scan for pulmonary embolism. ? ?CARDIAC DATABASE: ?EKG: ?10/19/2020: Normal sinus rhythm, 79 bpm, left axis, left bundle branch block.  No significant change compared to prior ECG. ? ?Echocardiogram: ?05/04/2020: ?Normal LV systolic function with visual EF 50-55%. Left ventricle cavity is normal in size. Severe concentric hypertrophy of the left ventricle. Normal global wall motion. Unable to evaluate diastolic function due to mitral annular calcification. Elevated LAP.  ?Native mitral valve. Posterior annular calcification. No evidence of mitral stenosis. Mild (Grade I) mitral regurgitation. ?Mild tricuspid regurgitation. No evidence of pulmonary hypertension. RVSP measures 32 mmHg. ?No prior studies available for comparison. ?  ?Stress Testing: ?Lexiscan Tetrofosmin stress test 05/11/2020: ?Lexiscan nuclear stress test performed using 1-day protocol. ?SPECT images show uniform breast tissue attenuation in both rest and stress images. In addition, there is medium sized, medium intensity, fixed perfusion defect in basal inferior/inferoseptal myocardium. ?All segments of left ventricle demonstrated septal dyskinesis, global hypokinesis. Stress LVEF 34%. ?High risk study due to low stress LVEF. ? ?Heart Catheterization: ?10/10/2010: ?HEMODYNAMIC DATA:  Right heart catheterization:  RA pressure 7/7, mean 6 ?mmHg.  RA saturation 76%. ?  ?  RV pressure 15/1, end-diastolic pressure 4 mmHg. ?  ?PA pressure 27/40 with a mean of 21 mmHg.  PA saturation was 75%. ?  ?Pulmonary capillary wedge 6/5 with a mean of 3 mmHg.  Aortic saturation ?was 100%. ?  ?Cardiac output was 6.4 with a cardiac index of 3.34 by Fick. ?  ?Right coronary artery:  Right coronary artery is a large caliber and ?superdominant vessel.  Gives origin to large PL branch and 2 PDA large ?branches.  Smooth and normal. ?   ?Left main coronary artery:  Left main coronary artery is a large-caliber ?vessel, which is smooth and normal. ?  ?Circumflex coronary artery:  Circumflex coronary artery is a very large- ?caliber vessel.  Gi

## 2021-04-25 ENCOUNTER — Other Ambulatory Visit: Payer: Self-pay | Admitting: Cardiology

## 2021-04-25 ENCOUNTER — Other Ambulatory Visit (HOSPITAL_COMMUNITY): Payer: Self-pay | Admitting: *Deleted

## 2021-04-25 DIAGNOSIS — I5032 Chronic diastolic (congestive) heart failure: Secondary | ICD-10-CM | POA: Diagnosis not present

## 2021-04-26 ENCOUNTER — Ambulatory Visit (HOSPITAL_COMMUNITY)
Admission: RE | Admit: 2021-04-26 | Discharge: 2021-04-26 | Disposition: A | Discharge status: Home / Self Care | Payer: Medicare Other | Source: Ambulatory Visit | Attending: Internal Medicine | Admitting: Internal Medicine

## 2021-04-26 DIAGNOSIS — M810 Age-related osteoporosis without current pathological fracture: Secondary | ICD-10-CM | POA: Diagnosis not present

## 2021-04-26 LAB — PRO B NATRIURETIC PEPTIDE: NT-Pro BNP: 2343 pg/mL — ABNORMAL HIGH (ref 0–738)

## 2021-04-26 LAB — BASIC METABOLIC PANEL
BUN/Creatinine Ratio: 13 (ref 12–28)
BUN: 21 mg/dL (ref 8–27)
CO2: 22 mmol/L (ref 20–29)
Calcium: 9.1 mg/dL (ref 8.7–10.3)
Chloride: 103 mmol/L (ref 96–106)
Creatinine, Ser: 1.6 mg/dL — ABNORMAL HIGH (ref 0.57–1.00)
Glucose: 163 mg/dL — ABNORMAL HIGH (ref 70–99)
Potassium: 4.4 mmol/L (ref 3.5–5.2)
Sodium: 140 mmol/L (ref 134–144)
eGFR: 32 mL/min/{1.73_m2} — ABNORMAL LOW (ref >59)

## 2021-04-26 LAB — MAGNESIUM: Magnesium: 1.5 mg/dL — ABNORMAL LOW (ref 1.6–2.3)

## 2021-04-26 MED ORDER — DENOSUMAB 60 MG/ML ~~LOC~~ SOSY
60.0000 mg | PREFILLED_SYRINGE | Freq: Once | SUBCUTANEOUS | Status: AC
  Administered 2021-04-26: 60 mg via SUBCUTANEOUS

## 2021-04-26 MED ORDER — DENOSUMAB 60 MG/ML ~~LOC~~ SOSY
PREFILLED_SYRINGE | SUBCUTANEOUS | Status: AC
  Filled 2021-04-26: qty 1

## 2021-04-28 ENCOUNTER — Other Ambulatory Visit: Payer: Self-pay | Admitting: Cardiology

## 2021-04-28 DIAGNOSIS — I5032 Chronic diastolic (congestive) heart failure: Secondary | ICD-10-CM

## 2021-04-28 DIAGNOSIS — E1165 Type 2 diabetes mellitus with hyperglycemia: Secondary | ICD-10-CM

## 2021-04-28 MED ORDER — EMPAGLIFLOZIN 10 MG PO TABS: 10.0000 mg | ORAL_TABLET | Freq: Every day | ORAL | 0 refills | Status: AC

## 2021-04-28 NOTE — Progress Notes (Signed)
Called and spoke to patient she was transferred to Dr. Terri Skains for further assistance

## 2021-05-01 ENCOUNTER — Other Ambulatory Visit: Payer: Self-pay

## 2021-05-01 DIAGNOSIS — I5032 Chronic diastolic (congestive) heart failure: Secondary | ICD-10-CM

## 2021-05-05 ENCOUNTER — Ambulatory Visit: Payer: Medicare Other | Admitting: Podiatry

## 2021-05-08 ENCOUNTER — Other Ambulatory Visit: Payer: Self-pay | Admitting: Cardiology

## 2021-05-08 DIAGNOSIS — I10 Essential (primary) hypertension: Secondary | ICD-10-CM

## 2021-05-08 DIAGNOSIS — I5032 Chronic diastolic (congestive) heart failure: Secondary | ICD-10-CM

## 2021-06-06 ENCOUNTER — Other Ambulatory Visit: Payer: Self-pay | Admitting: Cardiology

## 2021-06-06 DIAGNOSIS — I10 Essential (primary) hypertension: Secondary | ICD-10-CM

## 2021-06-06 DIAGNOSIS — I5032 Chronic diastolic (congestive) heart failure: Secondary | ICD-10-CM

## 2021-06-07 DIAGNOSIS — I130 Hypertensive heart and chronic kidney disease with heart failure and stage 1 through stage 4 chronic kidney disease, or unspecified chronic kidney disease: Secondary | ICD-10-CM | POA: Diagnosis not present

## 2021-06-07 DIAGNOSIS — N184 Chronic kidney disease, stage 4 (severe): Secondary | ICD-10-CM | POA: Diagnosis not present

## 2021-06-07 DIAGNOSIS — E1122 Type 2 diabetes mellitus with diabetic chronic kidney disease: Secondary | ICD-10-CM | POA: Diagnosis not present

## 2021-06-07 DIAGNOSIS — I509 Heart failure, unspecified: Secondary | ICD-10-CM | POA: Diagnosis not present

## 2021-06-21 ENCOUNTER — Encounter: Payer: Self-pay | Admitting: Podiatry

## 2021-06-21 ENCOUNTER — Ambulatory Visit: Payer: Medicare Other | Admitting: Podiatry

## 2021-06-21 DIAGNOSIS — E119 Type 2 diabetes mellitus without complications: Secondary | ICD-10-CM

## 2021-06-21 DIAGNOSIS — M79675 Pain in left toe(s): Secondary | ICD-10-CM | POA: Diagnosis not present

## 2021-06-21 DIAGNOSIS — B351 Tinea unguium: Secondary | ICD-10-CM | POA: Diagnosis not present

## 2021-06-21 DIAGNOSIS — M79674 Pain in right toe(s): Secondary | ICD-10-CM

## 2021-06-21 NOTE — Progress Notes (Signed)
This patient returns to my office for at risk foot care.  This patient requires this care by a professional since this patient will be at risk due to having diabetes and chronic kidney disease   This patient is unable to cut nails herself since the patient cannot reach her nails.These nails are painful walking and wearing shoes.  This patient presents for at risk foot care today.  General Appearance  Alert, conversant and in no acute stress.  Vascular  Dorsalis pedis and posterior tibial  pulses are palpable  bilaterally.  Capillary return is within normal limits  bilaterally. Temperature is within normal limits  bilaterally.  Neurologic  Senn-Weinstein monofilament wire test within normal limits  bilaterally. Muscle power within normal limits bilaterally.  Nails Thick disfigured discolored nails with subungual debris  Hallux nails  bilaterally. No evidence of bacterial infection or drainage bilaterally.  Orthopedic  No limitations of motion  feet .  No crepitus or effusions noted.  No bony pathology or digital deformities noted.Mild  HAV  B/L.  Skin  normotropic skin with no porokeratosis noted bilaterally.  No signs of infections or ulcers noted.     Onychomycosis  Pain in right toes  Pain in left toes  Consent was obtained for treatment procedures.   Mechanical debridement of nails 1-5  bilaterally performed with a nail nipper.  Filed with dremel without incident.    Return office visit  10 weeks                   Told patient to return for periodic foot care and evaluation due to potential at risk complications.   Gardiner Barefoot DPM

## 2021-06-30 ENCOUNTER — Other Ambulatory Visit: Payer: Self-pay | Admitting: Cardiology

## 2021-06-30 DIAGNOSIS — I5032 Chronic diastolic (congestive) heart failure: Secondary | ICD-10-CM

## 2021-06-30 DIAGNOSIS — I10 Essential (primary) hypertension: Secondary | ICD-10-CM

## 2021-07-09 ENCOUNTER — Other Ambulatory Visit: Payer: Self-pay | Admitting: Cardiology

## 2021-07-09 DIAGNOSIS — I5032 Chronic diastolic (congestive) heart failure: Secondary | ICD-10-CM

## 2021-07-09 DIAGNOSIS — I10 Essential (primary) hypertension: Secondary | ICD-10-CM

## 2021-09-01 ENCOUNTER — Other Ambulatory Visit: Payer: Self-pay | Admitting: Cardiology

## 2021-09-01 DIAGNOSIS — I5032 Chronic diastolic (congestive) heart failure: Secondary | ICD-10-CM

## 2021-09-01 DIAGNOSIS — I10 Essential (primary) hypertension: Secondary | ICD-10-CM

## 2021-09-08 ENCOUNTER — Ambulatory Visit: Payer: Medicare Other | Admitting: Podiatry

## 2021-09-13 DIAGNOSIS — H0102A Squamous blepharitis right eye, upper and lower eyelids: Secondary | ICD-10-CM | POA: Diagnosis not present

## 2021-09-13 DIAGNOSIS — H02831 Dermatochalasis of right upper eyelid: Secondary | ICD-10-CM | POA: Diagnosis not present

## 2021-09-13 DIAGNOSIS — E119 Type 2 diabetes mellitus without complications: Secondary | ICD-10-CM | POA: Diagnosis not present

## 2021-09-13 DIAGNOSIS — H0102B Squamous blepharitis left eye, upper and lower eyelids: Secondary | ICD-10-CM | POA: Diagnosis not present

## 2021-09-15 ENCOUNTER — Encounter: Payer: Self-pay | Admitting: Podiatry

## 2021-09-15 ENCOUNTER — Ambulatory Visit: Payer: Medicare Other | Admitting: Podiatry

## 2021-09-15 DIAGNOSIS — M79674 Pain in right toe(s): Secondary | ICD-10-CM | POA: Diagnosis not present

## 2021-09-15 DIAGNOSIS — E119 Type 2 diabetes mellitus without complications: Secondary | ICD-10-CM

## 2021-09-15 DIAGNOSIS — M79675 Pain in left toe(s): Secondary | ICD-10-CM

## 2021-09-15 DIAGNOSIS — B351 Tinea unguium: Secondary | ICD-10-CM | POA: Diagnosis not present

## 2021-09-15 NOTE — Progress Notes (Signed)
This patient returns to my office for at risk foot care.  This patient requires this care by a professional since this patient will be at risk due to having diabetes and chronic kidney disease   This patient is unable to cut nails herself since the patient cannot reach her nails.These nails are painful walking and wearing shoes.  This patient presents for at risk foot care today.  General Appearance  Alert, conversant and in no acute stress.  Vascular  Dorsalis pedis and posterior tibial  pulses are palpable  bilaterally.  Capillary return is within normal limits  bilaterally. Temperature is within normal limits  bilaterally.  Neurologic  Senn-Weinstein monofilament wire test within normal limits  bilaterally. Muscle power within normal limits bilaterally.  Nails Thick disfigured discolored nails with subungual debris  Hallux nails  bilaterally. No evidence of bacterial infection or drainage bilaterally.  Orthopedic  No limitations of motion  feet .  No crepitus or effusions noted.  No bony pathology or digital deformities noted.Mild  HAV  B/L.  Skin  normotropic skin with no porokeratosis noted bilaterally.  No signs of infections or ulcers noted.     Onychomycosis  Pain in right toes  Pain in left toes  Consent was obtained for treatment procedures.   Mechanical debridement of nails 1-5  bilaterally performed with a nail nipper.  Filed with dremel without incident.    Return office visit  10 weeks                   Told patient to return for periodic foot care and evaluation due to potential at risk complications.   Gardiner Barefoot DPM

## 2021-09-29 DIAGNOSIS — N1832 Chronic kidney disease, stage 3b: Secondary | ICD-10-CM | POA: Diagnosis not present

## 2021-10-03 DIAGNOSIS — N1832 Chronic kidney disease, stage 3b: Secondary | ICD-10-CM | POA: Diagnosis not present

## 2021-10-03 DIAGNOSIS — D631 Anemia in chronic kidney disease: Secondary | ICD-10-CM | POA: Diagnosis not present

## 2021-10-03 DIAGNOSIS — N2581 Secondary hyperparathyroidism of renal origin: Secondary | ICD-10-CM | POA: Diagnosis not present

## 2021-10-03 DIAGNOSIS — I129 Hypertensive chronic kidney disease with stage 1 through stage 4 chronic kidney disease, or unspecified chronic kidney disease: Secondary | ICD-10-CM | POA: Diagnosis not present

## 2021-10-06 DIAGNOSIS — E1129 Type 2 diabetes mellitus with other diabetic kidney complication: Secondary | ICD-10-CM | POA: Diagnosis not present

## 2021-10-06 DIAGNOSIS — E7800 Pure hypercholesterolemia, unspecified: Secondary | ICD-10-CM | POA: Diagnosis not present

## 2021-10-06 DIAGNOSIS — E039 Hypothyroidism, unspecified: Secondary | ICD-10-CM | POA: Diagnosis not present

## 2021-10-06 DIAGNOSIS — M109 Gout, unspecified: Secondary | ICD-10-CM | POA: Diagnosis not present

## 2021-10-10 ENCOUNTER — Other Ambulatory Visit: Payer: Self-pay | Admitting: Cardiology

## 2021-10-10 DIAGNOSIS — I10 Essential (primary) hypertension: Secondary | ICD-10-CM

## 2021-10-10 DIAGNOSIS — I5032 Chronic diastolic (congestive) heart failure: Secondary | ICD-10-CM

## 2021-10-13 DIAGNOSIS — Z794 Long term (current) use of insulin: Secondary | ICD-10-CM | POA: Diagnosis not present

## 2021-10-13 DIAGNOSIS — E1129 Type 2 diabetes mellitus with other diabetic kidney complication: Secondary | ICD-10-CM | POA: Diagnosis not present

## 2021-10-13 DIAGNOSIS — I517 Cardiomegaly: Secondary | ICD-10-CM | POA: Diagnosis not present

## 2021-10-13 DIAGNOSIS — I130 Hypertensive heart and chronic kidney disease with heart failure and stage 1 through stage 4 chronic kidney disease, or unspecified chronic kidney disease: Secondary | ICD-10-CM | POA: Diagnosis not present

## 2021-10-13 DIAGNOSIS — Z1339 Encounter for screening examination for other mental health and behavioral disorders: Secondary | ICD-10-CM | POA: Diagnosis not present

## 2021-10-13 DIAGNOSIS — Z23 Encounter for immunization: Secondary | ICD-10-CM | POA: Diagnosis not present

## 2021-10-13 DIAGNOSIS — Z0000 Encounter for general adult medical examination without abnormal findings: Secondary | ICD-10-CM | POA: Diagnosis not present

## 2021-10-13 DIAGNOSIS — R82998 Other abnormal findings in urine: Secondary | ICD-10-CM | POA: Diagnosis not present

## 2021-10-13 DIAGNOSIS — Z1331 Encounter for screening for depression: Secondary | ICD-10-CM | POA: Diagnosis not present

## 2021-10-17 ENCOUNTER — Encounter (HOSPITAL_COMMUNITY): Payer: Self-pay

## 2021-10-17 ENCOUNTER — Encounter: Payer: Self-pay | Admitting: Cardiology

## 2021-10-17 ENCOUNTER — Ambulatory Visit: Payer: Medicare Other | Admitting: Cardiology

## 2021-10-17 ENCOUNTER — Other Ambulatory Visit: Payer: Self-pay

## 2021-10-17 ENCOUNTER — Inpatient Hospital Stay (HOSPITAL_COMMUNITY)
Admission: EM | Admit: 2021-10-17 | Discharge: 2021-10-20 | DRG: 309 | Disposition: A | Discharge status: Home / Self Care | Payer: Medicare Other | Attending: Cardiology | Admitting: Cardiology

## 2021-10-17 ENCOUNTER — Observation Stay: Admit: 2021-10-17 | Payer: Medicare Other

## 2021-10-17 ENCOUNTER — Emergency Department (HOSPITAL_COMMUNITY): Payer: Medicare Other

## 2021-10-17 VITALS — BP 106/47 | HR 38 | Temp 98.2°F | Resp 16 | Ht 62.0 in | Wt 200.0 lb

## 2021-10-17 DIAGNOSIS — E782 Mixed hyperlipidemia: Secondary | ICD-10-CM

## 2021-10-17 DIAGNOSIS — I442 Atrioventricular block, complete: Principal | ICD-10-CM | POA: Diagnosis present

## 2021-10-17 DIAGNOSIS — I4430 Unspecified atrioventricular block: Secondary | ICD-10-CM | POA: Diagnosis not present

## 2021-10-17 DIAGNOSIS — I459 Conduction disorder, unspecified: Secondary | ICD-10-CM | POA: Diagnosis not present

## 2021-10-17 DIAGNOSIS — I10 Essential (primary) hypertension: Secondary | ICD-10-CM

## 2021-10-17 DIAGNOSIS — Z888 Allergy status to other drugs, medicaments and biological substances status: Secondary | ICD-10-CM | POA: Diagnosis not present

## 2021-10-17 DIAGNOSIS — D72829 Elevated white blood cell count, unspecified: Secondary | ICD-10-CM | POA: Diagnosis not present

## 2021-10-17 DIAGNOSIS — I2510 Atherosclerotic heart disease of native coronary artery without angina pectoris: Secondary | ICD-10-CM | POA: Diagnosis not present

## 2021-10-17 DIAGNOSIS — E039 Hypothyroidism, unspecified: Secondary | ICD-10-CM | POA: Diagnosis present

## 2021-10-17 DIAGNOSIS — R001 Bradycardia, unspecified: Principal | ICD-10-CM | POA: Diagnosis present

## 2021-10-17 DIAGNOSIS — I5032 Chronic diastolic (congestive) heart failure: Secondary | ICD-10-CM

## 2021-10-17 DIAGNOSIS — E6609 Other obesity due to excess calories: Secondary | ICD-10-CM | POA: Diagnosis present

## 2021-10-17 DIAGNOSIS — Z6836 Body mass index (BMI) 36.0-36.9, adult: Secondary | ICD-10-CM

## 2021-10-17 DIAGNOSIS — Z8616 Personal history of COVID-19: Secondary | ICD-10-CM | POA: Diagnosis not present

## 2021-10-17 DIAGNOSIS — N179 Acute kidney failure, unspecified: Secondary | ICD-10-CM | POA: Diagnosis not present

## 2021-10-17 DIAGNOSIS — I4510 Unspecified right bundle-branch block: Secondary | ICD-10-CM | POA: Diagnosis not present

## 2021-10-17 DIAGNOSIS — E1165 Type 2 diabetes mellitus with hyperglycemia: Secondary | ICD-10-CM | POA: Diagnosis not present

## 2021-10-17 DIAGNOSIS — I130 Hypertensive heart and chronic kidney disease with heart failure and stage 1 through stage 4 chronic kidney disease, or unspecified chronic kidney disease: Secondary | ICD-10-CM | POA: Diagnosis present

## 2021-10-17 DIAGNOSIS — I5033 Acute on chronic diastolic (congestive) heart failure: Secondary | ICD-10-CM

## 2021-10-17 DIAGNOSIS — I472 Ventricular tachycardia, unspecified: Secondary | ICD-10-CM | POA: Diagnosis not present

## 2021-10-17 DIAGNOSIS — D649 Anemia, unspecified: Secondary | ICD-10-CM | POA: Diagnosis not present

## 2021-10-17 DIAGNOSIS — Z5321 Procedure and treatment not carried out due to patient leaving prior to being seen by health care provider: Secondary | ICD-10-CM | POA: Diagnosis not present

## 2021-10-17 DIAGNOSIS — E785 Hyperlipidemia, unspecified: Secondary | ICD-10-CM | POA: Diagnosis present

## 2021-10-17 DIAGNOSIS — R1111 Vomiting without nausea: Secondary | ICD-10-CM | POA: Diagnosis not present

## 2021-10-17 DIAGNOSIS — R079 Chest pain, unspecified: Secondary | ICD-10-CM | POA: Diagnosis not present

## 2021-10-17 DIAGNOSIS — N189 Chronic kidney disease, unspecified: Secondary | ICD-10-CM | POA: Diagnosis not present

## 2021-10-17 DIAGNOSIS — R55 Syncope and collapse: Secondary | ICD-10-CM | POA: Diagnosis not present

## 2021-10-17 DIAGNOSIS — R000 Tachycardia, unspecified: Secondary | ICD-10-CM | POA: Diagnosis not present

## 2021-10-17 DIAGNOSIS — Z7984 Long term (current) use of oral hypoglycemic drugs: Secondary | ICD-10-CM

## 2021-10-17 DIAGNOSIS — Z79899 Other long term (current) drug therapy: Secondary | ICD-10-CM

## 2021-10-17 DIAGNOSIS — N184 Chronic kidney disease, stage 4 (severe): Secondary | ICD-10-CM | POA: Diagnosis not present

## 2021-10-17 DIAGNOSIS — R110 Nausea: Secondary | ICD-10-CM | POA: Diagnosis not present

## 2021-10-17 DIAGNOSIS — E1169 Type 2 diabetes mellitus with other specified complication: Secondary | ICD-10-CM

## 2021-10-17 DIAGNOSIS — I447 Left bundle-branch block, unspecified: Secondary | ICD-10-CM | POA: Diagnosis present

## 2021-10-17 DIAGNOSIS — E1122 Type 2 diabetes mellitus with diabetic chronic kidney disease: Secondary | ICD-10-CM | POA: Diagnosis not present

## 2021-10-17 LAB — URINALYSIS, COMPLETE (UACMP) WITH MICROSCOPIC
Bilirubin Urine: NEGATIVE
Glucose, UA: >=500 mg/dL — AB
Hgb urine dipstick: NEGATIVE
Ketones, ur: NEGATIVE mg/dL
Leukocytes,Ua: NEGATIVE
Nitrite: NEGATIVE
Protein, ur: 30 mg/dL — AB
Specific Gravity, Urine: 1.016 (ref 1.005–1.030)
pH: 5 (ref 5.0–8.0)

## 2021-10-17 LAB — PROCALCITONIN: Procalcitonin: <0.1 ng/mL

## 2021-10-17 LAB — TSH: TSH: 2.542 u[IU]/mL (ref 0.350–4.500)

## 2021-10-17 LAB — RESPIRATORY PANEL BY PCR

## 2021-10-17 LAB — CBC WITH DIFFERENTIAL/PLATELET
Abs Immature Granulocytes: 0.05 10*3/uL (ref 0.00–0.07)
Basophils Absolute: 0.1 10*3/uL (ref 0.0–0.1)
Basophils Relative: 1 %
Eosinophils Absolute: 0.5 10*3/uL (ref 0.0–0.5)
Eosinophils Relative: 4 %
HCT: 35.1 % — ABNORMAL LOW (ref 36.0–46.0)
Hemoglobin: 11.1 g/dL — ABNORMAL LOW (ref 12.0–15.0)
Immature Granulocytes: 0 %
Lymphocytes Relative: 14 %
Lymphs Abs: 1.8 10*3/uL (ref 0.7–4.0)
MCH: 32.5 pg (ref 26.0–34.0)
MCHC: 31.6 g/dL (ref 30.0–36.0)
MCV: 102.6 fL — ABNORMAL HIGH (ref 80.0–100.0)
Monocytes Absolute: 0.6 10*3/uL (ref 0.1–1.0)
Monocytes Relative: 5 %
Neutro Abs: 9.3 10*3/uL — ABNORMAL HIGH (ref 1.7–7.7)
Neutrophils Relative %: 76 %
Platelets: 268 10*3/uL (ref 150–400)
RBC: 3.42 MIL/uL — ABNORMAL LOW (ref 3.87–5.11)
RDW: 15.9 % — ABNORMAL HIGH (ref 11.5–15.5)
WBC: 12.3 10*3/uL — ABNORMAL HIGH (ref 4.0–10.5)
nRBC: 0 % (ref 0.0–0.2)

## 2021-10-17 LAB — COMPREHENSIVE METABOLIC PANEL
ALT: 9 U/L (ref 0–44)
AST: 13 U/L — ABNORMAL LOW (ref 15–41)
Albumin: 2.8 g/dL — ABNORMAL LOW (ref 3.5–5.0)
Alkaline Phosphatase: 56 U/L (ref 38–126)
Anion gap: 10 (ref 5–15)
BUN: 29 mg/dL — ABNORMAL HIGH (ref 8–23)
CO2: 22 mmol/L (ref 22–32)
Calcium: 9.2 mg/dL (ref 8.9–10.3)
Chloride: 107 mmol/L (ref 98–111)
Creatinine, Ser: 2.03 mg/dL — ABNORMAL HIGH (ref 0.44–1.00)
GFR, Estimated: 24 mL/min — ABNORMAL LOW (ref >60)
Glucose, Bld: 196 mg/dL — ABNORMAL HIGH (ref 70–99)
Potassium: 4.2 mmol/L (ref 3.5–5.1)
Sodium: 139 mmol/L (ref 135–145)
Total Bilirubin: 0.4 mg/dL (ref 0.3–1.2)
Total Protein: 6.4 g/dL — ABNORMAL LOW (ref 6.5–8.1)

## 2021-10-17 LAB — TROPONIN I (HIGH SENSITIVITY)
Troponin I (High Sensitivity): 13 ng/L (ref <18)
Troponin I (High Sensitivity): 20 ng/L — ABNORMAL HIGH (ref <18)

## 2021-10-17 LAB — CREATININE, SERUM
Creatinine, Ser: 2.05 mg/dL — ABNORMAL HIGH (ref 0.44–1.00)
GFR, Estimated: 24 mL/min — ABNORMAL LOW (ref >60)

## 2021-10-17 LAB — CBG MONITORING, ED: Glucose-Capillary: 172 mg/dL — ABNORMAL HIGH (ref 70–99)

## 2021-10-17 MED ORDER — ALLOPURINOL 300 MG PO TABS
300.0000 mg | ORAL_TABLET | Freq: Every day | ORAL | Status: DC
  Administered 2021-10-17 – 2021-10-20 (×4): 300 mg via ORAL
  Filled 2021-10-17: qty 1
  Filled 2021-10-17: qty 3
  Filled 2021-10-17: qty 1
  Filled 2021-10-17: qty 3

## 2021-10-17 MED ORDER — INSULIN ASPART 100 UNIT/ML IJ SOLN: 4.0000 [IU] | Freq: Three times a day (TID) | INTRAMUSCULAR | Status: DC

## 2021-10-17 MED ORDER — INSULIN ASPART 100 UNIT/ML IJ SOLN
0.0000 [IU] | Freq: Three times a day (TID) | INTRAMUSCULAR | Status: DC
  Administered 2021-10-18: 2 [IU] via SUBCUTANEOUS
  Administered 2021-10-18: 5 [IU] via SUBCUTANEOUS
  Administered 2021-10-19: 2 [IU] via SUBCUTANEOUS
  Administered 2021-10-19: 3 [IU] via SUBCUTANEOUS
  Administered 2021-10-20: 2 [IU] via SUBCUTANEOUS
  Administered 2021-10-20: 5 [IU] via SUBCUTANEOUS

## 2021-10-17 MED ORDER — CALCITRIOL 0.25 MCG PO CAPS
0.2500 ug | ORAL_CAPSULE | ORAL | Status: DC
  Administered 2021-10-18 – 2021-10-20 (×2): 0.25 ug via ORAL
  Filled 2021-10-17 (×3): qty 1

## 2021-10-17 MED ORDER — ATORVASTATIN CALCIUM 40 MG PO TABS
40.0000 mg | ORAL_TABLET | Freq: Every day | ORAL | Status: DC
  Administered 2021-10-17 – 2021-10-20 (×4): 40 mg via ORAL
  Filled 2021-10-17 (×4): qty 1

## 2021-10-17 MED ORDER — HYDRALAZINE HCL 10 MG PO TABS: 10.0000 mg | ORAL_TABLET | Freq: Three times a day (TID) | ORAL | Status: DC | PRN

## 2021-10-17 MED ORDER — INSULIN ASPART 100 UNIT/ML IJ SOLN: 0.0000 [IU] | Freq: Every day | INTRAMUSCULAR | Status: DC

## 2021-10-17 MED ORDER — HEPARIN SODIUM (PORCINE) 5000 UNIT/ML IJ SOLN
5000.0000 [IU] | Freq: Three times a day (TID) | INTRAMUSCULAR | Status: DC
  Administered 2021-10-17 – 2021-10-20 (×6): 5000 [IU] via SUBCUTANEOUS
  Filled 2021-10-17 (×6): qty 1

## 2021-10-17 NOTE — H&P (Signed)
HISTORY AND PHYSICAL  Patient ID: Connie Swanson MRN: [id number] DOB/AGE: 1941-01-01 81 y.o.  Admit date: 10/17/2021 Attending physician: Rex Kras, DO, [facility] Primary Physician:  Haywood Pao, MD  Chief complaint: Tired/fatigued  HPI:  Connie Swanson is a 81 y.o. female who presents with a chief complaint of " tired/fatigue." Her past medical history and cardiovascular risk factors include: HFpEF, single-vessel CAD (nonobstructive), insulin-dependent diabetes mellitus type 2, chronic kidney disease, hypertension, history of ischemic colitis, hypothyroidism, obesity due to excess calories, vertigo, history of COVID-19 infection (January 2022), postmenopausal female, advanced age.  Patient came to the office for routine 38-month follow-up visit given her underlying HFpEF and single-vessel CAD.  However incidentally she tells me she has been feeling more tired, fatigued, does not have much energy and has been having low blood pressures.  EKG was performed which notes underlying rhythm to be sinus bradycardia with complete heart block and ventricular escape rhythm.  No identifiable reversible causes except the fact that she is on carvedilol for underlying HFpEF.  Patient denies anginal discomfort and is not in florid heart failure.  Patient denies any recent sick contacts or sources of infection.  ALLERGIES: Allergies  Allergen Reactions   Dapagliflozin Nausea And Vomiting    PAST MEDICAL HISTORY: Past Medical History:  Diagnosis Date   CHF (congestive heart failure) ([facility])    Chronic kidney disease    Coronary artery disease    Diabetes mellitus without complication ([place])    Hyperlipidemia    Hypertension     PAST SURGICAL HISTORY: Past Surgical History:  Procedure Laterality Date   BREAST EXCISIONAL BIOPSY Right    HEMORRHOID SURGERY     MENISCUS REPAIR     THYROID SURGERY     VAGINAL HYSTERECTOMY      FAMILY HISTORY: The patient family history is not on file. She was  adopted.   SOCIAL HISTORY:  The patient  reports that she has never smoked. She has never used smokeless tobacco. She reports that she does not drink alcohol and does not use drugs.  MEDICATIONS: Current Outpatient Medications  Medication Instructions   allopurinol (ZYLOPRIM) 300 mg, Oral, Daily   atorvastatin (LIPITOR) 40 mg, Oral, Daily   B-D INS SYR ULTRAFINE 1CC/30G 30G X 1/2" 1 ML MISC USE SYRINGE AS DIRECTED TWICE DAILY   bumetanide (BUMEX) 0.5 mg, Oral, As needed   calcitRIOL (ROCALTROL) 0.25 mcg, Oral, Every other day, Mon, Wed, Fri   carvedilol (COREG) 25 mg, Oral, 2 times daily   Cholecalciferol (VITAMIN D3) 1.25 MG (50000 UT) TABS 1 tablet, Oral, Daily   denosumab (PROLIA) 60 mg, Subcutaneous, Every 6 months   empagliflozin (JARDIANCE) 10 MG TABS tablet 1 tablet, Oral, Daily   hydrALAZINE (APRESOLINE) 50 MG tablet TAKE 1 TABLET BY MOUTH THREE TIMES DAILY   isosorbide mononitrate (IMDUR) 30 MG 24 hr tablet Take 1/2 (one-half) tablet by mouth once daily   sacubitril-valsartan (ENTRESTO) 49-51 MG 1 tablet, Oral, 2 times daily    REVIEW OF SYSTEMS: Review of Systems  Constitutional: Positive for malaise/fatigue.  Cardiovascular:  Positive for dyspnea on exertion (Chronic and stable). Negative for chest pain, claudication, irregular heartbeat, leg swelling, near-syncope, orthopnea, palpitations, paroxysmal nocturnal dyspnea and syncope.  Respiratory:  Positive for shortness of breath (Chronic and stable).   Hematologic/Lymphatic: Negative for bleeding problem.  Musculoskeletal:  Negative for muscle cramps and myalgias.  Neurological:  Positive for dizziness and light-headedness.  All other systems reviewed and are negative.   PHYSICAL EXAM:  10/17/2021    5:00 PM 10/17/2021    4:30 PM 10/17/2021    3:34 PM  Vitals with BMI  Height   5\' 2"   Weight   200 lbs  BMI   82.99  Systolic 371 696   Diastolic 41 40   Pulse 33 36     No intake or output data in the 24  hours ending 10/17/21 1849  Net IO Since Admission: No IO data has been entered for this period [10/17/21 1849] CONSTITUTIONAL: Age-appropriate, hemodynamically stable, presents in a wheelchair  SKIN: Skin is warm and dry. No rash noted. No cyanosis. No pallor. No jaundice HEAD: Normocephalic and atraumatic.  EYES: No scleral icterus MOUTH/THROAT: Moist oral membranes.  NECK: No JVD present. No thyromegaly noted. No carotid bruits  CHEST Normal respiratory effort. No intercostal retractions  LUNGS: Clear to auscultation bilaterally.  No stridor. No wheezes. No rales.  CARDIOVASCULAR: Bradycardic, positive V8-L3, holosystolic murmur heard at the apex, no gallops or rubs ABDOMINAL: Obese, soft, nontender, nondistended, positive bowel sounds in all 4 quadrants, no apparent ascites.  EXTREMITIES: No peripheral edema  HEMATOLOGIC: No significant bruising NEUROLOGIC: Oriented to person, place, and time. Nonfocal. Normal muscle tone.  PSYCHIATRIC: Normal mood and affect. Normal behavior. Cooperative  RADIOLOGY: DG Chest Port 1 View  Result Date: 10/17/2021 CLINICAL DATA:  Bradycardia, history CHF, hypertension, coronary artery disease, diabetes mellitus, chronic kidney disease EXAM: PORTABLE CHEST 1 VIEW COMPARISON:  Portable exam 1602 hours compared to 04/28/2020 FINDINGS: External pacing leads project over chest. Enlargement of cardiac silhouette. Mediastinal contours and pulmonary vascularity normal. Atherosclerotic calcification aorta. Chronic accentuation of RIGHT upper lobe markings with volume loss question scarring. Central peribronchial thickening. Additional mild prominence of interstitial markings mid to lower lungs unchanged. No definite acute infiltrate, pleural effusion or pneumothorax. Bones demineralized. IMPRESSION: Enlargement of cardiac silhouette. Bronchitic and chronic interstitial changes with RIGHT upper lobe scarring. No acute abnormalities. Aortic Atherosclerosis (ICD10-I70.0).  Electronically Signed   By: Lavonia Dana M.D.   On: 10/17/2021 16:17    LABORATORY DATA: Lab Results  Component Value Date   WBC 12.3 (H) 10/17/2021   HGB 11.1 (L) 10/17/2021   HCT 35.1 (L) 10/17/2021   MCV 102.6 (H) 10/17/2021   PLT 268 10/17/2021    Recent Labs  Lab 10/17/21 1540  NA 139  K 4.2  CL 107  CO2 22  BUN 29*  CREATININE 2.03*  CALCIUM 9.2  PROT 6.4*  BILITOT 0.4  ALKPHOS 56  ALT 9  AST 13*  GLUCOSE 196*    Lipid Panel  No results found for: "CHOL", "HDL", "LDLCALC", "LDLDIRECT", "TRIG", "CHOLHDL"  BNP (last 3 results) No results for input(s): "BNP" in the last 8760 hours.  HEMOGLOBIN A1C No results found for: "HGBA1C", "MPG"  Cardiac Panel (last 3 results) No results for input(s): "CKTOTAL", "CKMB", "RELINDX" in the last 8760 hours.  Invalid input(s): "TROPONINHS"  No results found for: "CKTOTAL", "CKMB", "CKMBINDEX"   TSH Recent Labs    10/17/21 1540  TSH 2.542      CARDIAC DATABASE: EKG: 10/17/2021: Sinus bradycardia with complete heart block, 38 bpm, left axis, left anterior fascicular block, right bundle branch block, cannot rule out anterolateral ischemia.   Echocardiogram: 05/04/2020: Normal LV systolic function with visual EF 50-55%. Left ventricle cavity is normal in size. Severe concentric hypertrophy of the left ventricle. Normal global wall motion. Unable to evaluate diastolic function due to mitral annular calcification. Elevated LAP.  Native mitral valve. Posterior annular calcification. No  evidence of mitral stenosis. Mild (Grade I) mitral regurgitation. Mild tricuspid regurgitation. No evidence of pulmonary hypertension. RVSP measures 32 mmHg. No prior studies available for comparison.   Stress Testing: Lexiscan Tetrofosmin stress test 05/11/2020: Lexiscan nuclear stress test performed using 1-day protocol. SPECT images show uniform breast tissue attenuation in both rest and stress images. In addition, there is medium sized,  medium intensity, fixed perfusion defect in basal inferior/inferoseptal myocardium. All segments of left ventricle demonstrated septal dyskinesis, global hypokinesis. Stress LVEF 34%. High risk study due to low stress LVEF.   Heart Catheterization: 10/10/2010: HEMODYNAMIC DATA:  Right heart catheterization:  RA pressure 7/7, mean 6 mmHg.  RA saturation 76%.   RV pressure 46/9, end-diastolic pressure 4 mmHg.   PA pressure 27/40 with a mean of 21 mmHg.  PA saturation was 75%.   Pulmonary capillary wedge 6/5 with a mean of 3 mmHg.  Aortic saturation was 100%.   Cardiac output was 6.4 with a cardiac index of 3.34 by Fick.   Right coronary artery:  Right coronary artery is a large caliber and superdominant vessel.  Gives origin to large PL branch and 2 PDA large branches.  Smooth and normal.   Left main coronary artery:  Left main coronary artery is a large-caliber vessel, which is smooth and normal.   Circumflex coronary artery:  Circumflex coronary artery is a very large- caliber vessel.  Gives origin to a moderate-sized obtuse marginal 1 and continues distally as a large obtuse marginal 2.  Smooth and normal.   LAD:  LAD is a large-caliber vessel in the proximal segment.  Gives origin to a moderate-sized diagonal 1 and then gives origin to a very large diagonal 2, which is bigger than the LAD itself.  Between diagonal 1 and diagonal 2, there is a eccentric 50% stenosis noted in the LAD. The diagonal 2, which is very large has a ostial 30% stenosis. Otherwise, except for this focal stenosis of 50%, there is no other lesion noted in the LAD or the large diagonal 2.  IMPRESSION & RECOMMENDATIONS: Connie Swanson is a 81 y.o. Caucasian female whose past medical history and cardiovascular risk factors include: HFpEF, single-vessel CAD (nonobstructive), insulin-dependent diabetes mellitus type 2, chronic kidney disease, hypertension, history of ischemic colitis, hypothyroidism, obesity due  to excess calories, vertigo, history of COVID-19 infection (January 2022), postmenopausal female, advanced age.  Impression: Symptomatic bradycardia Complete heart block with escape ventricular rhythm. Right bundle branch block. Leukocytosis - on arrival Chronic HFpEF. Insulin-dependent diabetes mellitus type 2. Single-vessel nonobstructive CAD. Hyperlipidemia. Hypertension w/ CKD stage IV  Recommendations: Symptomatic bradycardia / complete heart block with escape ventricular rhythm / RBBB Hemodynamically stable. Hold blood pressure medications. Reversible cause: Carvedilol (has been on it chronically) TSH within normal limits Transcutaneous pads placed if needed. Atropine at bedside. Needs progressive bed Consulted electrophysiology for pacemaker evaluation. Echo will be ordered to evaluate for structural heart disease and left ventricular systolic function.  Leukocytosis: Present on arrival. Afebrile. Patient denies any sources of infection. We will check UA, procalcitonin, respiratory panel.   Chronic HFpEF: Stage B, NYHA class II  currently euvolemic Hold off Entresto secondary to renal function Will hold on GDMT until underlying bradycardia is resolved. Monitor clinically Check BNP Echo will be ordered to evaluate for structural heart disease and left ventricular systolic function.  Insulin-dependent diabetes mellitus type 2.  We will restart home medications/insulin.  Hyperlipidemia: Continue statin therapy  Hypertension with chronic kidney disease stage IV: No recent baseline creatinine  for evaluation.  Last creatinine in April 2022 1.6 mg/dL Recently has stopped metformin about 3 days ago, per patient We will hold nephrotoxic agents Monitor blood pressures We will place a as needed order for hydralazine 10 mg p.o. every 8 hours for systolics greater than 665 mmHg.  Consultants: Cardiac electrophysiology   Code Status: Full   Family Communication: Spoke  to the patient's daughter Anderson Malta   Disposition Plan: Home when medically safe   Patient's questions and concerns were addressed to her satisfaction. She voices understanding of the instructions provided during this encounter.   This note was created using a voice recognition software as a result there may be grammatical errors inadvertently enclosed that do not reflect the nature of this encounter. Every attempt is made to correct such errors.  Mechele Claude [facility]  Pager: [phone] Office: [phone] 10/17/2021, 6:49 PM

## 2021-10-17 NOTE — ED Triage Notes (Signed)
Sent by cards due to hr being in the 30s  Denies chest pain, complains of weakness and fatigue.

## 2021-10-17 NOTE — Progress Notes (Signed)
ID:  Connie Swanson, DOB [birth year], MRN [id number]  PCP:  Haywood Pao, MD  Cardiologist:  Rex Kras, DO, [facility] (established care 04/27/2020) Former Cardiology Providers: Dr. Adrian Prows   Date: 10/17/21 Last Office Visit: 04/17/2021  Chief Complaint  Patient presents with    heart failure management   Follow-up    HPI  Connie Swanson is a 81 y.o. female whose past medical history and cardiovascular risk factors include: HFpEF, single-vessel CAD (nonobstructive), insulin-dependent diabetes mellitus type 2, chronic kidney disease, hypertension, history of ischemic colitis, hypothyroidism, obesity due to excess calories, vertigo, history of COVID-19 infection (January 2022), postmenopausal female, advanced age.  Patient presents today for 32-month follow-up visit for management of HFpEF and single-vessel CAD.   Has been doing well overall however recently has been noticing episodes of feeling more tired, fatigued, lightheaded/dizziness.  The worst episode was earlier this morning prior to coming to the office visit.   She denies anginal discomfort or heart failure symptoms.  FUNCTIONAL STATUS: No structured exercise program or daily routine.   ALLERGIES: Allergies  Allergen Reactions   Dapagliflozin Nausea And Vomiting    MEDICATION LIST PRIOR TO VISIT: Current Meds  Medication Sig   allopurinol (ZYLOPRIM) 300 MG tablet Take 300 mg by mouth daily.   atorvastatin (LIPITOR) 40 MG tablet Take 40 mg by mouth daily.   B-D INS SYR ULTRAFINE 1CC/30G 30G X 1/2" 1 ML MISC USE SYRINGE AS DIRECTED TWICE DAILY   bumetanide (BUMEX) 0.5 MG tablet Take 1 tablet (0.5 mg total) by mouth as needed (Weight gain >1lb over 24hr or >3lb over 7 days.). (Patient taking differently: Take 0.5 mg by mouth as needed (weight gain >1lb over 24 hrs or >3lb over 7 days).)   calcitRIOL (ROCALTROL) 0.25 MCG capsule Take 0.25 mcg by mouth every other day. Mon, Wed, Fri   carvedilol (COREG) 25 MG tablet Take 25  mg by mouth 2 (two) times daily.   Cholecalciferol (VITAMIN D3) 1.25 MG (50000 UT) TABS Take 1 tablet by mouth daily at 12 noon.   denosumab (PROLIA) 60 MG/ML SOSY injection Inject 60 mg into the skin every 6 (six) months.   empagliflozin (JARDIANCE) 10 MG TABS tablet Take 1 tablet by mouth daily at 12 noon.   hydrALAZINE (APRESOLINE) 50 MG tablet TAKE 1 TABLET BY MOUTH THREE TIMES DAILY (Patient taking differently: Take 50 mg by mouth 3 (three) times daily.)   isosorbide mononitrate (IMDUR) 30 MG 24 hr tablet Take 1/2 (one-half) tablet by mouth once daily (Patient taking differently: Take 30 mg by mouth daily.)   sacubitril-valsartan (ENTRESTO) 49-51 MG Take 1 tablet by mouth 2 (two) times daily.   [DISCONTINUED] magnesium oxide (MAG-OX) 400 (240 Mg) MG tablet Take 400 mg by mouth 3 (three) times daily. (Patient not taking: Reported on 10/17/2021)   [DISCONTINUED] NOVOLIN 70/30 RELION (70-30) 100 UNIT/ML injection SMARTSIG:60 Unit(s) SUB-Q Twice Daily (Patient not taking: Reported on 10/17/2021)     PAST MEDICAL HISTORY: Past Medical History:  Diagnosis Date   CHF (congestive heart failure) ([place])    Chronic kidney disease    Coronary artery disease    Diabetes mellitus without complication ([place])    Hyperlipidemia    Hypertension     PAST SURGICAL HISTORY: Past Surgical History:  Procedure Laterality Date   BREAST EXCISIONAL BIOPSY Right    HEMORRHOID SURGERY     MENISCUS REPAIR     THYROID SURGERY     VAGINAL HYSTERECTOMY  FAMILY HISTORY: The patient family history is not on file. She was adopted.  SOCIAL HISTORY:  The patient  reports that she has never smoked. She has never used smokeless tobacco. She reports that she does not drink alcohol and does not use drugs.  REVIEW OF SYSTEMS: Review of Systems  Constitutional: Positive for malaise/fatigue. Negative for chills and fever.  HENT:  Negative for hoarse voice and nosebleeds.   Eyes:  Negative for discharge, double  vision and pain.  Cardiovascular:  Positive for dyspnea on exertion (chronic). Negative for chest pain, claudication, leg swelling, near-syncope, orthopnea, palpitations, paroxysmal nocturnal dyspnea and syncope.  Respiratory:  Positive for shortness of breath (chronic). Negative for hemoptysis.   Musculoskeletal:  Negative for muscle cramps and myalgias.  Gastrointestinal:  Negative for abdominal pain, constipation, diarrhea, hematemesis, hematochezia, melena, nausea and vomiting.  Neurological:  Positive for dizziness (this morning - better now) and light-headedness (this morning - better now).   PHYSICAL EXAM:    10/17/2021    5:00 PM 10/17/2021    4:30 PM 10/17/2021    3:34 PM  Vitals with BMI  Height   '5\' 2"'$   Weight   200 lbs  BMI   93.81  Systolic 017 510   Diastolic 41 40   Pulse 33 36    CONSTITUTIONAL: Age-appropriate female, hemodynamically stable, no acute distress.    SKIN: Skin is warm and dry. No rash noted. No cyanosis. No pallor. No jaundice HEAD: Normocephalic and atraumatic.  EYES: No scleral icterus MOUTH/THROAT: Moist oral membranes.  NECK: Short neck, increased adipose tissue, no JVD present. No thyromegaly noted. No carotid bruits  CHEST Normal respiratory effort. No intercostal retractions  LUNGS: Clear to auscultation bilaterally.  No stridor. No wheezes. No rales.  CARDIOVASCULAR: Bradycardic, C5-E5, soft holosystolic murmur heard at the apex, no gallops or rubs ABDOMINAL: Obese, nontender, nondistended, positive bowel sounds in all 4 quadrants, no apparent ascites.  EXTREMITIES: No peripheral edema,  no bilateral posterior tibial pulses. HEMATOLOGIC: No significant bruising NEUROLOGIC: Oriented to person, place, and time. Nonfocal. Normal muscle tone.  PSYCHIATRIC: Normal mood and affect. Normal behavior. Cooperative  RADIOLOGY:  VQ Scan:  04/28/2020: Negative perfusion lung scan for pulmonary embolism.  CARDIAC DATABASE: EKG: 10/17/2021: Sinus  bradycardia with complete heart block, 38 bpm, left axis, left anterior fascicular block, right bundle branch block, cannot rule out anterolateral ischemia.  Echocardiogram: 05/04/2020: Normal LV systolic function with visual EF 50-55%. Left ventricle cavity is normal in size. Severe concentric hypertrophy of the left ventricle. Normal global wall motion. Unable to evaluate diastolic function due to mitral annular calcification. Elevated LAP.  Native mitral valve. Posterior annular calcification. No evidence of mitral stenosis. Mild (Grade I) mitral regurgitation. Mild tricuspid regurgitation. No evidence of pulmonary hypertension. RVSP measures 32 mmHg. No prior studies available for comparison.   Stress Testing: Lexiscan Tetrofosmin stress test 05/11/2020: Lexiscan nuclear stress test performed using 1-day protocol. SPECT images show uniform breast tissue attenuation in both rest and stress images. In addition, there is medium sized, medium intensity, fixed perfusion defect in basal inferior/inferoseptal myocardium. All segments of left ventricle demonstrated septal dyskinesis, global hypokinesis. Stress LVEF 34%. High risk study due to low stress LVEF.  Heart Catheterization: 10/10/2010: HEMODYNAMIC DATA:  Right heart catheterization:  RA pressure 7/7, mean 6 mmHg.  RA saturation 76%.   RV pressure 27/7, end-diastolic pressure 4 mmHg.   PA pressure 27/40 with a mean of 21 mmHg.  PA saturation was 75%.   Pulmonary capillary  wedge 6/5 with a mean of 3 mmHg.  Aortic saturation was 100%.   Cardiac output was 6.4 with a cardiac index of 3.34 by Fick.   Right coronary artery:  Right coronary artery is a large caliber and superdominant vessel.  Gives origin to large PL branch and 2 PDA large branches.  Smooth and normal.   Left main coronary artery:  Left main coronary artery is a large-caliber vessel, which is smooth and normal.   Circumflex coronary artery:  Circumflex coronary  artery is a very large- caliber vessel.  Gives origin to a moderate-sized obtuse marginal 1 and continues distally as a large obtuse marginal 2.  Smooth and normal.   LAD:  LAD is a large-caliber vessel in the proximal segment.  Gives origin to a moderate-sized diagonal 1 and then gives origin to a very large diagonal 2, which is bigger than the LAD itself.  Between diagonal 1 and diagonal 2, there is a eccentric 50% stenosis noted in the LAD. The diagonal 2, which is very large has a ostial 30% stenosis. Otherwise, except for this focal stenosis of 50%, there is no other lesion noted in the LAD or the large diagonal 2.  LABORATORY DATA:    Latest Ref Rng & Units 10/17/2021    3:40 PM  CBC  WBC 4.0 - 10.5 K/uL 12.3   Hemoglobin 12.0 - 15.0 g/dL 11.1   Hematocrit 36.0 - 46.0 % 35.1   Platelets 150 - 400 K/uL 268        Latest Ref Rng & Units 10/17/2021    3:40 PM 04/25/2021    2:49 PM 01/12/2021    1:17 PM  CMP  Glucose 70 - 99 mg/dL 196  163  121   BUN 8 - 23 mg/dL $Remove'29  21  21   'cZmvlGo$ Creatinine 0.44 - 1.00 mg/dL 2.03  1.60  1.46   Sodium 135 - 145 mmol/L 139  140  140   Potassium 3.5 - 5.1 mmol/L 4.2  4.4  4.4   Chloride 98 - 111 mmol/L 107  103  103   CO2 22 - 32 mmol/L $RemoveB'22  22  26   'McDYWSnc$ Calcium 8.9 - 10.3 mg/dL 9.2  9.1  8.2   Total Protein 6.5 - 8.1 g/dL 6.4     Total Bilirubin 0.3 - 1.2 mg/dL 0.4     Alkaline Phos 38 - 126 U/L 56     AST 15 - 41 U/L 13     ALT 0 - 44 U/L 9       Lipid Panel  No results found for: "CHOL", "TRIG", "HDL", "CHOLHDL", "VLDL", "LDLCALC", "LDLDIRECT", "LABVLDL"  No components found for: "NTPROBNP" Recent Labs    11/24/20 1313 01/12/21 1316 04/25/21 1449  PROBNP 2,066* 1,948* 2,343*   Recent Labs    10/17/21 1540  TSH 2.542    BMP Recent Labs    01/12/21 1317 04/25/21 1449 10/17/21 1540  NA 140 140 139  K 4.4 4.4 4.2  CL 103 103 107  CO2 $Re'26 22 22  'HSB$ GLUCOSE 121* 163* 196*  BUN 21 21 29*  CREATININE 1.46* 1.60* 2.03*  CALCIUM 8.2*  9.1 9.2  GFRNONAA  --   --  24*    HEMOGLOBIN A1C No results found for: "HGBA1C", "MPG"  External labs: 09/07/2019:  Total cholesterol 147, triglycerides 186, HDL 27, LDL 83, non-HDL 120 TSH 2.15 Hemoglobin A1c 6.5 White count 11.75, hemoglobin 11.1 g/dL, hematocrit 34.3%  External Labs: Collected: 10/13/2021 provided  by referring physician. NT proBNP 2450 EGFR 23.8 Sodium 143, potassium 4.7, chloride 104, bicarb 28 AST 11, ALT 10, alkaline phosphatase 60. BUN 26, creatinine 2 Hemoglobin 10.9 g/dL, hematocrit 31.5% Total cholesterol 134, triglycerides 119, HDL 27, LDL 83, non-HDL 107 TSH 3.03 A1c 6  IMPRESSION:    ICD-10-CM   1. Symptomatic bradycardia  R00.1     2. Complete heart block (HCC)  I44.2     3. Chronic heart failure with preserved ejection fraction (HCC)  I50.32 EKG 12-Lead    4. Type 2 diabetes mellitus with hyperglycemia, with long-term current use of insulin (HCC)  E11.65    Z79.4     5. Nonobstructive atherosclerosis of coronary artery  I25.10     6. Mixed hyperlipidemia  E78.2     7. Benign hypertension  I10          RECOMMENDATIONS: Connie Swanson is a 81 y.o. female whose past medical history and cardiac risk factors include: Single-vessel CAD (nonobstructive), insulin-dependent diabetes mellitus type 2, chronic kidney disease, hypertension, history of ischemic colitis, hypothyroidism, obesity due to excess calories, vertigo, history of COVID-19 infection (January 2022), postmenopausal female, advanced age.  Symptomatic bradycardia / Complete heart block ([place]) Presents to the office with symptomatic bradycardia. EKG notes underlying sinus rhythm with complete heart block and ventricular escape rhythm. Patient will be electively admitted to [facility] to be evaluated for pacemaker implantation. Reversible causes: Currently on carvedilol. However, given her history of HFpEF will need to be on beta-blockers long-term. Spoke to the patient's daughter  Almyra Free who will take her to the ED for further evaluation and management as no beds are available.  Further recommendations to follow.  Patient will be going to the ED for further evaluation and management of symptomatic bradycardia due to complete heart block.  FINAL MEDICATION LIST END OF ENCOUNTER: No orders of the defined types were placed in this encounter.    Medications Discontinued During This Encounter  Medication Reason   metFORMIN (GLUCOPHAGE) 1000 MG tablet      Current Outpatient Medications:    allopurinol (ZYLOPRIM) 300 MG tablet, Take 300 mg by mouth daily., Disp: , Rfl:    atorvastatin (LIPITOR) 40 MG tablet, Take 40 mg by mouth daily., Disp: , Rfl:    B-D INS SYR ULTRAFINE 1CC/30G 30G X 1/2" 1 ML MISC, USE SYRINGE AS DIRECTED TWICE DAILY, Disp: , Rfl:    bumetanide (BUMEX) 0.5 MG tablet, Take 1 tablet (0.5 mg total) by mouth as needed (Weight gain >1lb over 24hr or >3lb over 7 days.). (Patient taking differently: Take 0.5 mg by mouth as needed (weight gain >1lb over 24 hrs or >3lb over 7 days).), Disp: 30 tablet, Rfl: 1   calcitRIOL (ROCALTROL) 0.25 MCG capsule, Take 0.25 mcg by mouth every other day. Mon, Wed, Fri, Disp: , Rfl:    carvedilol (COREG) 25 MG tablet, Take 25 mg by mouth 2 (two) times daily., Disp: , Rfl:    Cholecalciferol (VITAMIN D3) 1.25 MG (50000 UT) TABS, Take 1 tablet by mouth daily at 12 noon., Disp: , Rfl:    denosumab (PROLIA) 60 MG/ML SOSY injection, Inject 60 mg into the skin every 6 (six) months., Disp: , Rfl:    empagliflozin (JARDIANCE) 10 MG TABS tablet, Take 1 tablet by mouth daily at 12 noon., Disp: , Rfl:    hydrALAZINE (APRESOLINE) 50 MG tablet, TAKE 1 TABLET BY MOUTH THREE TIMES DAILY (Patient taking differently: Take 50 mg by mouth 3 (three) times  daily.), Disp: 90 tablet, Rfl: 0   isosorbide mononitrate (IMDUR) 30 MG 24 hr tablet, Take 1/2 (one-half) tablet by mouth once daily (Patient taking differently: Take 30 mg by mouth daily.),  Disp: 15 tablet, Rfl: 0   sacubitril-valsartan (ENTRESTO) 49-51 MG, Take 1 tablet by mouth 2 (two) times daily., Disp: 180 tablet, Rfl: 3  Orders Placed This Encounter  Procedures   EKG 12-Lead    There are no Patient Instructions on file for this visit.   --Continue cardiac medications as reconciled in final medication list. --Return in about 6 weeks (around 11/28/2021) for Follow up post hospitalization.. Or sooner if needed. --Continue follow-up with your primary care physician regarding the management of your other chronic comorbid conditions.  Patient's questions and concerns were addressed to her satisfaction. She voices understanding of the instructions provided during this encounter.   This note was created using a voice recognition software as a result there may be grammatical errors inadvertently enclosed that do not reflect the nature of this encounter. Every attempt is made to correct such errors.   Rex Kras, [place], [facility]  Pager: [phone] Office: [phone]

## 2021-10-17 NOTE — ED Provider Notes (Addendum)
[facility] EMERGENCY DEPARTMENT Provider Note   CSN: [id number] Arrival date & time: 10/17/21  1512     History  Chief Complaint  Patient presents with   Bradycardia    Connie Swanson is a 81 y.o. female.  81 year old female with prior medical history as detailed below presents from Dr. Brennan Bailey office.  Patient was sent to be admitted for symptomatic bradycardia.  Patient reports several episodes over the last several days of significant weakness associated with exertion and/or prolonged standing.  With rest she reports no symptoms.  She was advised by Dr. Terri Skains this morning that she was in complete heart block and would require pacemaker.  Patient denies current chest pain.  She denies shortness of breath.  She denies recent illness.  The history is provided by the patient and medical records.       Home Medications Prior to Admission medications   Medication Sig Start Date End Date Taking? Authorizing Provider  allopurinol (ZYLOPRIM) 300 MG tablet Take 300 mg by mouth daily.    [provider]  atorvastatin (LIPITOR) 40 MG tablet Take 40 mg by mouth daily. 03/04/18   [provider]  B-D INS SYR ULTRAFINE 1CC/30G 30G X 1/2" 1 ML MISC USE SYRINGE AS DIRECTED TWICE DAILY 08/26/19   [provider]  bumetanide (BUMEX) 0.5 MG tablet Take 1 tablet (0.5 mg total) by mouth as needed (Weight gain >1lb over 24hr or >3lb over 7 days.). Patient taking differently: Take 0.5 mg by mouth daily as needed. 01/16/21 10/17/21  Tolia, Sunit, DO  calcitRIOL (ROCALTROL) 0.25 MCG capsule Take 0.25 mcg by mouth every other day. Mon, Wed, Fri    [provider]  carvedilol (COREG) 25 MG tablet Take 25 mg by mouth 2 (two) times daily. 09/11/19   [provider]  Cholecalciferol (VITAMIN D3) 1.25 MG (50000 UT) TABS Take 1 tablet by mouth daily at 12 noon.    [provider]  denosumab (PROLIA) 60 MG/ML SOSY injection Inject 60 mg  into the skin every 6 (six) months.    [provider]  empagliflozin (JARDIANCE) 10 MG TABS tablet Take 1 tablet by mouth daily at 12 noon.    [provider]  hydrALAZINE (APRESOLINE) 50 MG tablet TAKE 1 TABLET BY MOUTH THREE TIMES DAILY 09/01/21   Tolia, Sunit, DO  isosorbide mononitrate (IMDUR) 30 MG 24 hr tablet Take 1/2 (one-half) tablet by mouth once daily 10/10/21   Tolia, Sunit, DO  magnesium oxide (MAG-OX) 400 (240 Mg) MG tablet Take 400 mg by mouth 3 (three) times daily.    [provider]  NOVOLIN 70/30 RELION (70-30) 100 UNIT/ML injection SMARTSIG:60 Unit(s) SUB-Q Twice Daily 09/21/19   [provider]  sacubitril-valsartan (ENTRESTO) 49-51 MG Take 1 tablet by mouth 2 (two) times daily. 12/14/20   Tolia, Sunit, DO      Allergies    Dapagliflozin    Review of Systems   Review of Systems  All other systems reviewed and are negative.   Physical Exam Updated Vital Signs BP 122/84 (BP Location: Right Arm)   Pulse (!) 36   Temp 98.3 F (36.8 C) (Oral)   Resp 16   Ht 5\' 2"  (1.575 m)   Wt 90.7 kg   SpO2 94%   BMI 36.58 kg/m  Physical Exam Vitals and nursing note reviewed.  Constitutional:      General: She is not in acute distress.    Appearance: Normal appearance. She is  well-developed.  HENT:     Head: Normocephalic and atraumatic.  Eyes:     Conjunctiva/sclera: Conjunctivae normal.     Pupils: Pupils are equal, round, and reactive to light.  Cardiovascular:     Rate and Rhythm: Regular rhythm. Bradycardia present.     Heart sounds: Normal heart sounds.  Pulmonary:     Effort: Pulmonary effort is normal. No respiratory distress.     Breath sounds: Normal breath sounds.  Abdominal:     General: There is no distension.     Palpations: Abdomen is soft.     Tenderness: There is no abdominal tenderness.  Musculoskeletal:        General: No deformity. Normal range of motion.     Cervical back: Normal range of motion and neck supple.   Skin:    General: Skin is warm and dry.  Neurological:     General: No focal deficit present.     Mental Status: She is alert and oriented to person, place, and time.     ED Results / Procedures / Treatments   Labs (all labs ordered are listed, but only abnormal results are displayed) Labs Reviewed  CBC WITH DIFFERENTIAL/PLATELET - Abnormal; Notable for the following components:      Result Value   WBC 12.3 (*)    RBC 3.42 (*)    Hemoglobin 11.1 (*)    HCT 35.1 (*)    MCV 102.6 (*)    RDW 15.9 (*)    Neutro Abs 9.3 (*)    All other components within normal limits  COMPREHENSIVE METABOLIC PANEL  TSH  TROPONIN I (HIGH SENSITIVITY)    EKG EKG Interpretation  Date/Time:  Tuesday October 17 2021 15:16:55 EDT Ventricular Rate:  38 PR Interval:    QRS Duration: 126 QT Interval:  642 QTC Calculation: 510 R Axis:   -57 Text Interpretation: Marked sinus bradycardia with A-V dissociation and Idioventricular rhythm with complete heart block Confirmed by Dene Gentry [phone]) on 10/17/2021 4:10:54 PM  Radiology No results found.  Procedures Procedures    Medications Ordered in ED Medications - No data to display  ED Course/ Medical Decision Making/ A&P                           Medical Decision Making Amount and/or Complexity of Data Reviewed Labs: ordered. Radiology: ordered.  Risk Decision regarding hospitalization.    Medical Screen Complete  This patient presented to the ED with complaint of symptomatic bradycardia.  This complaint involves an extensive number of treatment options. The initial differential diagnosis includes, but is not limited to, pleat heart block, arrhythmia, metabolic abnormality, etc.  This presentation is: Acute, Chronic, Self-Limited, Previously Undiagnosed, Uncertain Prognosis, Complicated, Systemic Symptoms, and Threat to Life/Bodily Function  Patient is presenting from cardiology office with recently diagnosed complete heart  block.  Patient is minimally symptomatic on evaluation in the ED.  Patient to be admitted to Dr. Brennan Bailey service.  Dr. Terri Skains is aware of patient's placement here in the ED at [facility].  Patient understands plan of care.  Additional history obtained:  External records from outside sources obtained and reviewed including prior ED visits and prior Inpatient records.    Lab Tests:  I ordered and personally interpreted labs.  The pertinent results include:  cbc, bmp, tsh, trop   Imaging Studies ordered:  I ordered imaging studies including cxr  I independently visualized and interpreted obtained imaging which showed nad I  agree with the radiologist interpretation.   Cardiac Monitoring:  The patient was maintained on a cardiac monitor.  I personally viewed and interpreted the cardiac monitor which showed an underlying rhythm of: CHB/Bradycardia  Problem List / ED Course:  Symptomatic Bradycardia   Reevaluation:  After the interventions noted above, I reevaluated the patient and found that they have: stayed the same Disposition:  After consideration of the diagnostic results and the patients response to treatment, I feel that the patent would benefit from admission.    CRITICAL CARE Performed by: Valarie Merino   Total critical care time: 30 minutes  Critical care time was exclusive of separately billable procedures and treating other patients.  Critical care was necessary to treat or prevent imminent or life-threatening deterioration.  Critical care was time spent personally by me on the following activities: development of treatment plan with patient and/or surrogate as well as nursing, discussions with consultants, evaluation of patient's response to treatment, examination of patient, obtaining history from patient or surrogate, ordering and performing treatments and interventions, ordering and review of laboratory studies, ordering and review of radiographic studies, pulse  oximetry and re-evaluation of patient's condition.          Final Clinical Impression(s) / ED Diagnoses Final diagnoses:  Symptomatic bradycardia    Rx / DC Orders ED Discharge Orders     None         Valarie Merino, MD 10/17/21 1555    Valarie Merino, MD 10/17/21 [phone]

## 2021-10-18 ENCOUNTER — Inpatient Hospital Stay (HOSPITAL_COMMUNITY): Payer: Medicare Other

## 2021-10-18 DIAGNOSIS — I442 Atrioventricular block, complete: Secondary | ICD-10-CM | POA: Diagnosis not present

## 2021-10-18 DIAGNOSIS — I4430 Unspecified atrioventricular block: Secondary | ICD-10-CM

## 2021-10-18 DIAGNOSIS — I459 Conduction disorder, unspecified: Secondary | ICD-10-CM

## 2021-10-18 LAB — CBC
HCT: 30.7 % — ABNORMAL LOW (ref 36.0–46.0)
Hemoglobin: 10.2 g/dL — ABNORMAL LOW (ref 12.0–15.0)
MCH: 33.7 pg (ref 26.0–34.0)
MCHC: 33.2 g/dL (ref 30.0–36.0)
MCV: 101.3 fL — ABNORMAL HIGH (ref 80.0–100.0)
Platelets: 268 10*3/uL (ref 150–400)
RBC: 3.03 MIL/uL — ABNORMAL LOW (ref 3.87–5.11)
RDW: 16 % — ABNORMAL HIGH (ref 11.5–15.5)
WBC: 10.9 10*3/uL — ABNORMAL HIGH (ref 4.0–10.5)
nRBC: 0 % (ref 0.0–0.2)

## 2021-10-18 LAB — BASIC METABOLIC PANEL
Anion gap: 7 (ref 5–15)
BUN: 34 mg/dL — ABNORMAL HIGH (ref 8–23)
CO2: 24 mmol/L (ref 22–32)
Calcium: 9.1 mg/dL (ref 8.9–10.3)
Chloride: 108 mmol/L (ref 98–111)
Creatinine, Ser: 2.09 mg/dL — ABNORMAL HIGH (ref 0.44–1.00)
GFR, Estimated: 23 mL/min — ABNORMAL LOW (ref >60)
Glucose, Bld: 131 mg/dL — ABNORMAL HIGH (ref 70–99)
Potassium: 4.9 mmol/L (ref 3.5–5.1)
Sodium: 139 mmol/L (ref 135–145)

## 2021-10-18 LAB — PROCALCITONIN: Procalcitonin: <0.1 ng/mL

## 2021-10-18 LAB — CBG MONITORING, ED
Glucose-Capillary: 141 mg/dL — ABNORMAL HIGH (ref 70–99)
Glucose-Capillary: 225 mg/dL — ABNORMAL HIGH (ref 70–99)

## 2021-10-18 LAB — GLUCOSE, CAPILLARY: Glucose-Capillary: 131 mg/dL — ABNORMAL HIGH (ref 70–99)

## 2021-10-18 NOTE — Progress Notes (Signed)
Patient transferred from ED at 1325hrs.  Oriented to unit and plan of care for shift.  Patient verbalized understanding.  Son at bedside.

## 2021-10-18 NOTE — Consult Note (Signed)
Cardiology Consultation   Patient ID: SINIYA LICHTY MRN: [id number]; DOB: Nov 22, 1940  Admit date: 10/17/2021 Date of Consult: 10/18/2021  PCP:  Connie Pao, MD   [facility] Providers Cardiologist:  Connie Swanson   Patient Profile:   Connie Swanson is a 81 y.o. female with a hx of HFpEF, DM, HTN, hypothyroidism, obesity, vertigo, CAD (single vessel non-obstructive in review of Dr. Brennan Bailey note), CKD (IV),  who is being seen 10/18/2021 for the evaluation of CHB at the request of Connie Swanson.  History of Present Illness:   Connie Swanson saw Connie Swanson yesterday at a previously scheduled 6 mo  visit with c/o of unusual fatigue and lower BPs at home, she was bradycardic her EKG noting CHB, she was referred to the ER for further evaluation and management.  Significant dyspnea on exertion limited to about 25 feet.  Chronic apnea and sleeps in a recliner and has for many years.  Dependent edema.  Modest hypertension relatively well controlled on medicine.  Denies orthostasis.  Denies carpal tunnel symptoms.  Cardiac evaluation 5/22 had discordant data related to ejection fraction with a Myoview at 35% and echo at 55% and echocardiogram reporting severe left ventricular hypertrophy with a septum of 35 mm in the posterior wall of 19 mm.  Brother died suddenly at the age of 28 no other history of premature death  LABS K+ 4.2, 4.9 BUN/Creat 34/2.09 (baseline looks 1.4-1.6 HS Trop 13, 20 WBC 12.3 > 10.9 (afebrile) H/H 10.2/30 Plts 268 TSH 2.542  HOME meds include: COREG 25mg  BID, last does yesterday AM  Past Medical History:  Diagnosis Date   CHF (congestive heart failure) (HCC)    Chronic kidney disease    Coronary artery disease    Diabetes mellitus without complication ([place])    Hyperlipidemia    Hypertension     Past Surgical History:  Procedure Laterality Date   BREAST EXCISIONAL BIOPSY Right    HEMORRHOID SURGERY     MENISCUS REPAIR     THYROID SURGERY      VAGINAL HYSTERECTOMY       Home Medications:  Prior to Admission medications   Medication Sig Start Date End Date Taking? Authorizing Provider  allopurinol (ZYLOPRIM) 300 MG tablet Take 300 mg by mouth daily.   Yes [provider]  atorvastatin (LIPITOR) 40 MG tablet Take 40 mg by mouth daily. 03/04/18  Yes [provider]  bumetanide (BUMEX) 0.5 MG tablet Take 1 tablet (0.5 mg total) by mouth as needed (Weight gain >1lb over 24hr or >3lb over 7 days.). Patient taking differently: Take 0.5 mg by mouth as needed (weight gain >1lb over 24 hrs or >3lb over 7 days). 01/16/21 10/17/21 Yes Tolia, Sunit, DO  calcitRIOL (ROCALTROL) 0.25 MCG capsule Take 0.25 mcg by mouth every other day. Mon, Wed, Fri   Yes [provider]  carvedilol (COREG) 25 MG tablet Take 25 mg by mouth 2 (two) times daily. 09/11/19  Yes [provider]  Cholecalciferol (VITAMIN D3) 1.25 MG (50000 UT) TABS Take 1 tablet by mouth daily at 12 noon.   Yes [provider]  denosumab (PROLIA) 60 MG/ML SOSY injection Inject 60 mg into the skin every 6 (six) months.   Yes [provider]  empagliflozin (JARDIANCE) 10 MG TABS tablet Take 1 tablet by mouth daily at 12 noon.   Yes [provider]  hydrALAZINE (APRESOLINE) 50 MG tablet TAKE 1 TABLET BY MOUTH THREE TIMES DAILY Patient taking differently:  Take 50 mg by mouth 3 (three) times daily. 09/01/21  Yes Tolia, Sunit, DO  isosorbide mononitrate (IMDUR) 30 MG 24 hr tablet Take 1/2 (one-half) tablet by mouth once daily Patient taking differently: Take 30 mg by mouth daily. 10/10/21  Yes Tolia, Sunit, DO  sacubitril-valsartan (ENTRESTO) 49-51 MG Take 1 tablet by mouth 2 (two) times daily. 12/14/20  Yes Tolia, Sunit, DO  B-D INS SYR ULTRAFINE 1CC/30G 30G X 1/2" 1 ML MISC USE SYRINGE AS DIRECTED TWICE DAILY 08/26/19   [provider]    Inpatient Medications: Scheduled Meds:  allopurinol  300 mg Oral Daily   atorvastatin  40  mg Oral Daily   calcitRIOL  0.25 mcg Oral Q M,W,F   heparin  5,000 Units Subcutaneous Q8H   insulin aspart  0-15 Units Subcutaneous TID WC   insulin aspart  0-5 Units Subcutaneous QHS   Continuous Infusions:  PRN Meds: hydrALAZINE  Allergies:    Allergies  Allergen Reactions   Dapagliflozin Nausea And Vomiting    Social History:   Social History   Socioeconomic History   Marital status: Widowed    Spouse name: Not on file   Number of children: 4   Years of education: Not on file   Highest education level: Not on file  Occupational History   Not on file  Tobacco Use   Smoking status: Never   Smokeless tobacco: Never  Vaping Use   Vaping Use: Never used  Substance and Sexual Activity   Alcohol use: Never   Drug use: Never   Sexual activity: Not on file  Other Topics Concern   Not on file  Social History Narrative   Not on file   Social Determinants of Health   Financial Resource Strain: Not on file  Food Insecurity: Not on file  Transportation Needs: Not on file  Physical Activity: Not on file  Stress: Not on file  Social Connections: Not on file  Intimate Partner Violence: Not on file    Family History:   Family History  Adopted: Yes     ROS:  Please see the history of present illness.  All other ROS reviewed and negative.     Physical Exam/Data:   Vitals:   10/18/21 0402 10/18/21 0500 10/18/21 0530 10/18/21 0600  BP: (!) 131/43 (!) 133/47 (!) 126/58 (!) 117/43  Pulse: (!) 40 (!) 37 (!) 40 (!) 39  Resp: 18 (!) 21 17 18   Temp: 98 F (36.7 C)     TempSrc: Oral     SpO2: 96% 94% 91% 93%  Weight:      Height:       No intake or output data in the 24 hours ending 10/18/21 0645    10/17/2021    3:34 PM 10/17/2021   11:03 AM 04/17/2021   11:41 AM  Last 3 Weights  Weight (lbs) 200 lb 200 lb 201 lb  Weight (kg) 90.719 kg 90.719 kg 91.173 kg     Body mass index is 36.58 kg/m.  General:  Well nourished, well developed, in no acute distress   HEENT: normal Neck: 10 cm with positive HJR Vascular: No carotid bruits; Distal pulses 2+ bilaterally Cardiac:  normal S1, S2; RRR; no murmur  Lungs:  clear to auscultation bilaterally, no wheezing, rhonchi or rales  Abd: soft, nontender, no hepatomegaly  Ext: no edema Musculoskeletal:  No deformities, BUE and BLE strength normal and equal Skin: warm and dry  Neuro:  CNs 2-12 intact, no focal abnormalities  noted Psych:  Normal affect   EKG:  The EKG was personally reviewed and demonstrates:   10/18/2021 sinus rhythm at 65 with 2: 1 conduction and a left bundle branch block with a QRSd of about 140 ms 10/17/2021 CHB 38bpm, RBBB, LAD  OLD 10/19/20: SR 79bpm, LBBB (148), LAD  Telemetry:  Telemetry was personally reviewed and demonstrates: Post decline no not not the right guy I am not sure the old guy going out so the adequate things block by Dr. Christian Mate okay thank you in the hospital him [place]  Relevant CV Studies:  Echocardiogram: 05/04/2020: Normal LV systolic function with visual EF 50-55%. Left ventricle cavity is normal in size. Severe concentric hypertrophy of the left ventricle. Normal global wall motion. Unable to evaluate diastolic function due to mitral annular calcification. Elevated LAP.  Native mitral valve. Posterior annular calcification. No evidence of mitral stenosis. Mild (Grade I) mitral regurgitation. Mild tricuspid regurgitation. No evidence of pulmonary hypertension. RVSP measures 32 mmHg. No prior studies available for comparison.   Stress Testing: Lexiscan Tetrofosmin stress test 05/11/2020: Lexiscan nuclear stress test performed using 1-day protocol. SPECT images show uniform breast tissue attenuation in both rest and stress images. In addition, there is medium sized, medium intensity, fixed perfusion defect in basal inferior/inferoseptal myocardium. All segments of left ventricle demonstrated septal dyskinesis, global hypokinesis. Stress LVEF 34%. High risk  study due to low stress LVEF.   Heart Catheterization: 10/10/2010: HEMODYNAMIC DATA:  Right heart catheterization:  RA pressure 7/7, mean 6 mmHg.  RA saturation 76%.   RV pressure 33/2, end-diastolic pressure 4 mmHg.   PA pressure 27/40 with a mean of 21 mmHg.  PA saturation was 75%.   Pulmonary capillary wedge 6/5 with a mean of 3 mmHg.  Aortic saturation was 100%.   Cardiac output was 6.4 with a cardiac index of 3.34 by Fick.   Right coronary artery:  Right coronary artery is a large caliber and superdominant vessel.  Gives origin to large PL branch and 2 PDA large branches.  Smooth and normal.   Left main coronary artery:  Left main coronary artery is a large-caliber vessel, which is smooth and normal.   Circumflex coronary artery:  Circumflex coronary artery is a very large- caliber vessel.  Gives origin to a moderate-sized obtuse marginal 1 and continues distally as a large obtuse marginal 2.  Smooth and normal.   LAD:  LAD is a large-caliber vessel in the proximal segment.  Gives origin to a moderate-sized diagonal 1 and then gives origin to a very large diagonal 2, which is bigger than the LAD itself.  Between diagonal 1 and diagonal 2, there is a eccentric 50% stenosis noted in the LAD. The diagonal 2, which is very large has a ostial 30% stenosis. Otherwise, except for this focal stenosis of 50%, there is no other lesion noted in the LAD or the large diagonal 2.    Laboratory Data:  High Sensitivity Troponin:   Recent Labs  Lab 10/17/21 1540 10/17/21 1921  TROPONINIHS 13 20*     Chemistry Recent Labs  Lab 10/17/21 1540 10/17/21 1921 10/18/21 0411  NA 139  --  139  K 4.2  --  4.9  CL 107  --  108  CO2 22  --  24  GLUCOSE 196*  --  131*  BUN 29*  --  34*  CREATININE 2.03* 2.05* 2.09*  CALCIUM 9.2  --  9.1  GFRNONAA 24* 24* 23*  ANIONGAP 10  --  7    Recent Labs  Lab 10/17/21 1540  PROT 6.4*  ALBUMIN 2.8*  AST 13*  ALT 9  ALKPHOS 56  BILITOT  0.4   Lipids No results for input(s): "CHOL", "TRIG", "HDL", "LABVLDL", "LDLCALC", "CHOLHDL" in the last 168 hours.  Hematology Recent Labs  Lab 10/17/21 1540 10/18/21 0411  WBC 12.3* 10.9*  RBC 3.42* 3.03*  HGB 11.1* 10.2*  HCT 35.1* 30.7*  MCV 102.6* 101.3*  MCH 32.5 33.7  MCHC 31.6 33.2  RDW 15.9* 16.0*  PLT 268 268   Thyroid  Recent Labs  Lab 10/17/21 1540  TSH 2.542    BNPNo results for input(s): "BNP", "PROBNP" in the last 168 hours.  DDimer No results for input(s): "DDIMER" in the last 168 hours.   Radiology/Studies:  DG Chest Port 1 View  Result Date: 10/17/2021 CLINICAL DATA:  Bradycardia, history CHF, hypertension, coronary artery disease, diabetes mellitus, chronic kidney disease EXAM: PORTABLE CHEST 1 VIEW COMPARISON:  Portable exam 1602 hours compared to 04/28/2020 FINDINGS: External pacing leads project over chest. Enlargement of cardiac silhouette. Mediastinal contours and pulmonary vascularity normal. Atherosclerotic calcification aorta. Chronic accentuation of RIGHT upper lobe markings with volume loss question scarring. Central peribronchial thickening. Additional mild prominence of interstitial markings mid to lower lungs unchanged. No definite acute infiltrate, pleural effusion or pneumothorax. Bones demineralized. IMPRESSION: Enlargement of cardiac silhouette. Bronchitic and chronic interstitial changes with RIGHT upper lobe scarring. No acute abnormalities. Aortic Atherosclerosis (ICD10-I70.0). Electronically Signed   By: Lavonia Dana M.D.   On: 10/17/2021 16:17     Assessment and Plan:   LBBB baseliine  Syncope-recurrent-abrupt onset-offset  CHB with RBBB escape with 2:1 conduction this am off carvedilol  LVH  (35/16 mm ???)  HFpEF  Renal insufficiency grade 4  Anemia probably related to the above (macrocytic)  The patient presented with complete heart block in the setting of chronic left bundle branch block and high-dose carvedilol therapy.   With the discontinuation of carvedilol after yesterday morning's dose, conduction is improved, initially 2: 1 and now 3: 2.  Carvedilol half-life is 7-10 hours so by tomorrow morning we should have a fuller picture of underlying conduction off beta-blocker.  In the event that she has 1: 1 conduction, I would favor discharge with an AT monitor.  If she has recurrent syncope or recurrent heart block I would proceed with pacing, in relationship to the former, a couple of recent trials have shown decreased event rates with empiric pacing not withstanding the fact that there is no significant change in recurrent syncope rates.  The other major issue relates to the anatomy described on her echo.  Specifically, does she have hypertrophy to the degree listed particular in light of the fact that she does not have a significant history of hypertension prompts the question as to whether she has hypertrophic cardiomyopathy.  While this has little impact on mortality risk stratification, it may be that the presence of an inducible murmur and inducible gradient would offer her the opportunity for treatment with mevacamten.  Moreover, as it is a genetic disorder, it prompts program genetic screening and [facility] testing.  Interesting, her blood pressure is not very high.  If there is an adverse effect on her renal function from [facility], I am not sure that the data for [facility] and HFpEF would warrant continuing it.  We will follow with you

## 2021-10-18 NOTE — Progress Notes (Signed)
Progress Note  Patient Name: Connie Swanson Date of Encounter: 10/18/2021  Attending physician: Rex Kras, DO Primary care provider: Haywood Pao, MD Primary Cardiologist: Rex Kras, DO, [facility]  Subjective: Connie Swanson is a 81 y.o. female who was seen and examined at bedside  Denies chest pain. Clinically feels better compared to yesterday's office visit Waiting for bed upstairs Daughter present at bedside  Objective: Vital Signs in the last 24 hours: Temp:  [97.8 F (36.6 C)-98.4 F (36.9 C)] (P) 98.4 F (36.9 C) (10/11 2015) Pulse Rate:  [30-53] (P) 53 (10/11 2015) Resp:  [12-28] 12 (10/11 1707) BP: (107-183)/(40-98) (P) 151/50 (10/11 2015) SpO2:  [91 %-97 %] 94 % (10/11 1707) Weight:  [90.7 kg] 90.7 kg (10/11 1327)  Intake/Output:  Intake/Output Summary (Last 24 hours) at 10/18/2021 2226 Last data filed at 10/18/2021 1400 Gross per 24 hour  Intake 240 ml  Output 200 ml  Net 40 ml    Net IO Since Admission: 40 mL [10/18/21 2226]  Weights:  Filed Weights   10/17/21 1534 10/18/21 1327  Weight: 90.7 kg 90.7 kg    Telemetry: Personally reviewed.  Sinus bradycardia with 2:1 conduction  Physical examination: PHYSICAL EXAM:    10/18/2021    8:15 PM 10/18/2021    5:07 PM 10/18/2021    2:18 PM  Vitals with BMI  Systolic [phone]  Diastolic 50 60 44  Pulse 53 30 39    General: Age-appropriate, hemodynamically stable, no acute distress. HEENT: Normocephalic, atraumatic, no carotid bruits, neck supple, JVP Lungs: Clear to auscultation bilaterally no wheezes rales or rhonchi's Heart: Bradycardic, positive S1-S2, no murmurs rubs or gallops appreciated Abdomen: Obese, soft, nontender, nondistended, positive bowel sounds in all 4 quadrants Extremities: No peripheral edema Neuro: Cranial nerves II to XII are grossly intact, nonfocal, muscle strength grossly preserved Psych: Normal mood and affect, normal behavior, cooperative.  Lab  Results: Hematology Recent Labs  Lab 10/17/21 1540 10/18/21 0411  WBC 12.3* 10.9*  RBC 3.42* 3.03*  HGB 11.1* 10.2*  HCT 35.1* 30.7*  MCV 102.6* 101.3*  MCH 32.5 33.7  MCHC 31.6 33.2  RDW 15.9* 16.0*  PLT 268 268    Chemistry Recent Labs  Lab 10/17/21 1540 10/17/21 1921 10/18/21 0411  NA 139  --  139  K 4.2  --  4.9  CL 107  --  108  CO2 22  --  24  GLUCOSE 196*  --  131*  BUN 29*  --  34*  CREATININE 2.03* 2.05* 2.09*  CALCIUM 9.2  --  9.1  PROT 6.4*  --   --   ALBUMIN 2.8*  --   --   AST 13*  --   --   ALT 9  --   --   ALKPHOS 56  --   --   BILITOT 0.4  --   --   GFRNONAA 24* 24* 23*  ANIONGAP 10  --  7     Cardiac Enzymes: Cardiac Panel (last 3 results) Recent Labs    10/17/21 1540 10/17/21 1921  TROPONINIHS 13 20*    BNP (last 3 results) No results for input(s): "BNP" in the last 8760 hours.  ProBNP (last 3 results) Recent Labs    11/24/20 1313 01/12/21 1316 04/25/21 1449  PROBNP 2,066* 1,948* 2,343*     DDimer No results for input(s): "DDIMER" in the last 168 hours.   Hemoglobin A1c: No results found for: "HGBA1C", "MPG"  TSH  Recent Labs  10/17/21 1540  TSH 2.542    Lipid Panel No results found for: "CHOL", "TRIG", "HDL", "CHOLHDL", "VLDL", "LDLCALC", "LDLDIRECT"  Imaging: DG Chest Port 1 View  Result Date: 10/17/2021 CLINICAL DATA:  Bradycardia, history CHF, hypertension, coronary artery disease, diabetes mellitus, chronic kidney disease EXAM: PORTABLE CHEST 1 VIEW COMPARISON:  Portable exam 1602 hours compared to 04/28/2020 FINDINGS: External pacing leads project over chest. Enlargement of cardiac silhouette. Mediastinal contours and pulmonary vascularity normal. Atherosclerotic calcification aorta. Chronic accentuation of RIGHT upper lobe markings with volume loss question scarring. Central peribronchial thickening. Additional mild prominence of interstitial markings mid to lower lungs unchanged. No definite acute infiltrate,  pleural effusion or pneumothorax. Bones demineralized. IMPRESSION: Enlargement of cardiac silhouette. Bronchitic and chronic interstitial changes with RIGHT upper lobe scarring. No acute abnormalities. Aortic Atherosclerosis (ICD10-I70.0). Electronically Signed   By: Lavonia Dana M.D.   On: 10/17/2021 16:17    CARDIAC DATABASE: EKG: 10/17/2021: Sinus bradycardia with complete heart block, 38 bpm, left axis, left anterior fascicular block, right bundle branch block, cannot rule out anterolateral ischemia.  10/18/2021: Sinus rhythm 65 bpm 2:1 conduction disease, left bundle branch block, QRS 140 ms   Echocardiogram: 05/04/2020: Normal LV systolic function with visual EF 50-55%. Left ventricle cavity is normal in size. Severe concentric hypertrophy of the left ventricle. Normal global wall motion. Unable to evaluate diastolic function due to mitral annular calcification. Elevated LAP.  Native mitral valve. Posterior annular calcification. No evidence of mitral stenosis. Mild (Grade I) mitral regurgitation. Mild tricuspid regurgitation. No evidence of pulmonary hypertension. RVSP measures 32 mmHg. No prior studies available for comparison.   Stress Testing: Lexiscan Tetrofosmin stress test 05/11/2020: Lexiscan nuclear stress test performed using 1-day protocol. SPECT images show uniform breast tissue attenuation in both rest and stress images. In addition, there is medium sized, medium intensity, fixed perfusion defect in basal inferior/inferoseptal myocardium. All segments of left ventricle demonstrated septal dyskinesis, global hypokinesis. Stress LVEF 34%. High risk study due to low stress LVEF.   Heart Catheterization: 10/10/2010: HEMODYNAMIC DATA:  Right heart catheterization:  RA pressure 7/7, mean 6 mmHg.  RA saturation 76%.   RV pressure 39/0, end-diastolic pressure 4 mmHg.   PA pressure 27/40 with a mean of 21 mmHg.  PA saturation was 75%.   Pulmonary capillary wedge 6/5 with a mean  of 3 mmHg.  Aortic saturation was 100%.   Cardiac output was 6.4 with a cardiac index of 3.34 by Fick.   Right coronary artery:  Right coronary artery is a large caliber and superdominant vessel.  Gives origin to large PL branch and 2 PDA large branches.  Smooth and normal.   Left main coronary artery:  Left main coronary artery is a large-caliber vessel, which is smooth and normal.   Circumflex coronary artery:  Circumflex coronary artery is a very large- caliber vessel.  Gives origin to a moderate-sized obtuse marginal 1 and continues distally as a large obtuse marginal 2.  Smooth and normal.   LAD:  LAD is a large-caliber vessel in the proximal segment.  Gives origin to a moderate-sized diagonal 1 and then gives origin to a very large diagonal 2, which is bigger than the LAD itself.  Between diagonal 1 and diagonal 2, there is a eccentric 50% stenosis noted in the LAD. The diagonal 2, which is very large has a ostial 30% stenosis. Otherwise, except for this focal stenosis of 50%, there is no other lesion noted in the LAD or the large diagonal 2.  Scheduled Meds:  allopurinol  300 mg Oral Daily   atorvastatin  40 mg Oral Daily   calcitRIOL  0.25 mcg Oral Q M,W,F   heparin  5,000 Units Subcutaneous Q8H   insulin aspart  0-15 Units Subcutaneous TID WC   insulin aspart  0-5 Units Subcutaneous QHS    Continuous Infusions:   PRN Meds: hydrALAZINE   IMPRESSION & RECOMMENDATIONS: Connie Swanson is a 81 y.o. Caucasian female whose past medical history and cardiac risk factors include: HFpEF, single-vessel CAD (nonobstructive), insulin-dependent diabetes mellitus type 2, chronic kidney disease, hypertension, history of ischemic colitis, hypothyroidism, obesity due to excess calories, vertigo, history of COVID-19 infection (January 2022), postmenopausal female, advanced age.  Impression: Symptomatic bradycardia Conduction disease of the heart (on arrival complete heart block, now 2:1  conduction) Leukocytosis-likely reactive. Chronic HFpEF. Acute kidney injury on chronic kidney disease Insulin-dependent diabetes mellitus type 2 Single-vessel CAD Hyperlipidemia Hypertension with chronic kidney disease stage IV  Recommendations: Symptomatic bradycardia/conduction disease of the heart/baseline left bundle branch block: Symptomatic on arrival, but hemodynamically stable. Was in complete heart block yesterday and now illustrates 2-1 conduction.  Reversible cause: Last dose of carvedilol (a.m. of October 17, 2021).  TSH within normal limits. Echocardiogram pending.  Evaluated by EP -recommendations appreciated.  Monitor for now. N.p.o. after midnight  Leukocytosis: Likely secondary to recent COVID-19 and influenza immunization.  Procalcitonin within normal limits UA within acceptable limits No febrile episodes 20 pathogen respiratory panel negative White count now back to baseline. Monitor for now  Chronic HFpEF: Stage B, NYHA class II: Currently euvolemic. We will hold off Jardiance/Entresto secondary to acute kidney injury. Monitor for now  Insulin-dependent diabetes mellitus type 2: Currently on sliding scale insulin  Hyperlipidemia: Continue statin therapy  Hypertension with chronic kidney disease stage IV Last creatinine in April 2020 to 1.6 mg/dL Due to rising creatinine PCP recently stopped metformin 3 days prior to admission. Monitor BUN and creatinine Given the underlying conduction disease we will hold off on aggressively addressing her blood pressure. As needed hydralazine ordered for SBP greater than 180 mmHg  Consultants: Cardiac electrophysiology   Code Status: Full   Family Communication: Spoke to the patient's daughter Anderson Malta   Disposition Plan: Home when medically safe  Patient's questions and concerns were addressed to her satisfaction. She voices understanding of the instructions provided during this encounter.   This note was  created using a voice recognition software as a result there may be grammatical errors inadvertently enclosed that do not reflect the nature of this encounter. Every attempt is made to correct such errors.  Total time spent: 35 minutes.  Mechele Claude [facility]  Pager: [phone] Office: [phone] 10/18/2021, 10:26 PM

## 2021-10-18 NOTE — Care Management (Signed)
  Transition of Care [facility]) Screening Note   Patient Details  Name: Connie Swanson Date of Birth: 1940/04/06   Transition of Care [facility] CM/SW Contact:    Bethena Roys, RN Phone Number: 10/18/2021, 2:49 PM    Transition of Care Department [facility]) has reviewed the patient and no TOC needs have been identified at this time. Case Manager will continue to monitor patient advancement through interdisciplinary progression rounds. If new patient transition needs arise, please place a TOC consult.

## 2021-10-18 NOTE — ED Notes (Signed)
Verbal order from Dr. Terri Skains to hold 0600 dose of heparin subq.

## 2021-10-19 ENCOUNTER — Inpatient Hospital Stay (HOSPITAL_COMMUNITY): Payer: Medicare Other

## 2021-10-19 LAB — ECHOCARDIOGRAM LIMITED
AR max vel: 2.81 cm2
AV Area VTI: 2.5 cm2
AV Area mean vel: 2.64 cm2
AV Mean grad: 8 mmHg
AV Peak grad: 14.4 mmHg
Ao pk vel: 1.9 m/s
Area-P 1/2: 5.38 cm2
Calc EF: 59.5 %
Height: 62 in
MV VTI: 2.3 cm2
S' Lateral: 3.3 cm
Single Plane A2C EF: 64.3 %
Single Plane A4C EF: 54.5 %
Weight: 3166.4 oz

## 2021-10-19 LAB — BASIC METABOLIC PANEL
Anion gap: 8 (ref 5–15)
BUN: 35 mg/dL — ABNORMAL HIGH (ref 8–23)
CO2: 21 mmol/L — ABNORMAL LOW (ref 22–32)
Calcium: 9.3 mg/dL (ref 8.9–10.3)
Chloride: 108 mmol/L (ref 98–111)
Creatinine, Ser: 2.07 mg/dL — ABNORMAL HIGH (ref 0.44–1.00)
GFR, Estimated: 23 mL/min — ABNORMAL LOW (ref >60)
Glucose, Bld: 207 mg/dL — ABNORMAL HIGH (ref 70–99)
Potassium: 4.5 mmol/L (ref 3.5–5.1)
Sodium: 137 mmol/L (ref 135–145)

## 2021-10-19 LAB — ECHOCARDIOGRAM COMPLETE
AR max vel: 2.18 cm2
AV Area VTI: 2.35 cm2
AV Area mean vel: 2.16 cm2
AV Mean grad: 7.5 mmHg
AV Peak grad: 13 mmHg
Ao pk vel: 1.8 m/s
Area-P 1/2: 3.89 cm2
Height: 62 in
MV VTI: 1.19 cm2
Weight: 3200 oz

## 2021-10-19 LAB — GLUCOSE, CAPILLARY
Glucose-Capillary: 106 mg/dL — ABNORMAL HIGH (ref 70–99)
Glucose-Capillary: 109 mg/dL — ABNORMAL HIGH (ref 70–99)
Glucose-Capillary: 162 mg/dL — ABNORMAL HIGH (ref 70–99)
Glucose-Capillary: 128 mg/dL — ABNORMAL HIGH (ref 70–99)
Glucose-Capillary: 167 mg/dL — ABNORMAL HIGH (ref 70–99)

## 2021-10-19 LAB — PROCALCITONIN: Procalcitonin: <0.1 ng/mL

## 2021-10-19 LAB — CBC
HCT: 30.3 % — ABNORMAL LOW (ref 36.0–46.0)
Hemoglobin: 10.2 g/dL — ABNORMAL LOW (ref 12.0–15.0)
MCH: 34 pg (ref 26.0–34.0)
MCHC: 33.7 g/dL (ref 30.0–36.0)
MCV: 101 fL — ABNORMAL HIGH (ref 80.0–100.0)
Platelets: 258 10*3/uL (ref 150–400)
RBC: 3 MIL/uL — ABNORMAL LOW (ref 3.87–5.11)
RDW: 16 % — ABNORMAL HIGH (ref 11.5–15.5)
WBC: 13.1 10*3/uL — ABNORMAL HIGH (ref 4.0–10.5)
nRBC: 0 % (ref 0.0–0.2)

## 2021-10-19 LAB — BRAIN NATRIURETIC PEPTIDE: B Natriuretic Peptide: 688.8 pg/mL — ABNORMAL HIGH (ref 0.0–100.0)

## 2021-10-19 MED ORDER — ISOSORB DINITRATE-HYDRALAZINE 20-37.5 MG PO TABS
1.0000 | ORAL_TABLET | Freq: Three times a day (TID) | ORAL | Status: DC
  Administered 2021-10-19 – 2021-10-20 (×2): 1 via ORAL
  Filled 2021-10-19 (×2): qty 1

## 2021-10-19 NOTE — Progress Notes (Signed)
Heart Failure Navigator Progress Note  Assessed for Heart & Vascular TOC clinic readiness.  Prior to hospitalization care established with [facility].   HF Navigation team will sign-off.    Pricilla Holm, MSN, RN Heart Failure Nurse Navigator

## 2021-10-19 NOTE — Progress Notes (Signed)
  Echocardiogram 2D Echocardiogram has been performed.  Connie Swanson 10/19/2021, 10:45 AM

## 2021-10-19 NOTE — Plan of Care (Signed)

## 2021-10-19 NOTE — Progress Notes (Signed)
Progress Note  Patient Name: Connie Swanson Date of Encounter: 10/19/2021  Attending physician: Rex Kras, DO Primary care provider: Haywood Pao, MD Primary Cardiologist: Rex Kras, DO, [facility]  Subjective: Connie Swanson is a 80 y.o. female who was seen and examined at bedside  Ventricular rate improving, intermittently still has AV conduction disease on telemetry Denies chest pain.  Objective: Vital Signs in the last 24 hours: Temp:  [98.1 F (36.7 C)-98.7 F (37.1 C)] 98.7 F (37.1 C) (10/12 1552) Pulse Rate:  [53-92] 76 (10/12 1552) Resp:  [14-20] 14 (10/12 1552) BP: (142-185)/(49-71) 142/67 (10/12 1552) SpO2:  [94 %-96 %] 96 % (10/12 1552) Weight:  [89.8 kg] 89.8 kg (10/12 0430)  Intake/Output: No intake or output data in the 24 hours ending 10/19/21 1845   Net IO Since Admission: 40 mL [10/19/21 1845]  Weights:  Filed Weights   10/17/21 1534 10/18/21 1327 10/19/21 0430  Weight: 90.7 kg 90.7 kg 89.8 kg    Telemetry: Personally reviewed.  Sinus rhythm with intermittent Mobitz type II  Physical examination: PHYSICAL EXAM:    10/19/2021    3:52 PM 10/19/2021   12:00 PM 10/19/2021    7:33 AM  Vitals with BMI  Systolic [phone]  Diastolic 67 71 67  Pulse 76 78 80    General: Age-appropriate, hemodynamically stable, no acute distress. HEENT: Normocephalic, atraumatic, no carotid bruits, neck supple, JVP Lungs: Clear to auscultation bilaterally no wheezes rales or rhonchi's Heart: Regular, positive S1-S2, no murmurs rubs or gallops appreciated Abdomen: Obese, soft, nontender, nondistended, positive bowel sounds in all 4 quadrants Extremities: No peripheral edema Neuro: Cranial nerves II to XII are grossly intact, nonfocal, muscle strength grossly preserved Psych: Normal mood and affect, normal behavior, cooperative.  Lab Results: Hematology Recent Labs  Lab 10/17/21 1540 10/18/21 0411 10/19/21 0040  WBC 12.3* 10.9* 13.1*  RBC 3.42* 3.03* 3.00*   HGB 11.1* 10.2* 10.2*  HCT 35.1* 30.7* 30.3*  MCV 102.6* 101.3* 101.0*  MCH 32.5 33.7 34.0  MCHC 31.6 33.2 33.7  RDW 15.9* 16.0* 16.0*  PLT 268 268 258    Chemistry Recent Labs  Lab 10/17/21 1540 10/17/21 1921 10/18/21 0411 10/19/21 0040  NA 139  --  139 137  K 4.2  --  4.9 4.5  CL 107  --  108 108  CO2 22  --  24 21*  GLUCOSE 196*  --  131* 207*  BUN 29*  --  34* 35*  CREATININE 2.03* 2.05* 2.09* 2.07*  CALCIUM 9.2  --  9.1 9.3  PROT 6.4*  --   --   --   ALBUMIN 2.8*  --   --   --   AST 13*  --   --   --   ALT 9  --   --   --   ALKPHOS 56  --   --   --   BILITOT 0.4  --   --   --   GFRNONAA 24* 24* 23* 23*  ANIONGAP 10  --  7 8     Cardiac Enzymes: Cardiac Panel (last 3 results) Recent Labs    10/17/21 1540 10/17/21 1921  TROPONINIHS 13 20*    BNP (last 3 results) Recent Labs    10/19/21 0040  BNP 688.8*    ProBNP (last 3 results) Recent Labs    11/24/20 1313 01/12/21 1316 04/25/21 1449  PROBNP 2,066* 1,948* 2,343*     DDimer No results for input(s): "DDIMER"  in the last 168 hours.   Hemoglobin A1c: No results found for: "HGBA1C", "MPG"  TSH  Recent Labs    10/17/21 1540  TSH 2.542    Lipid Panel No results found for: "CHOL", "TRIG", "HDL", "CHOLHDL", "VLDL", "LDLCALC", "LDLDIRECT"  Imaging: ECHOCARDIOGRAM LIMITED  Result Date: 10/19/2021    ECHOCARDIOGRAM LIMITED REPORT   Patient Name:   Connie Swanson Date of Exam: 10/19/2021 Medical Rec #:  [id number]   Height:       62.0 in Accession #:    [id number]  Weight:       197.9 lb Date of Birth:  Oct 18, 1940   BSA:          1.903 m Patient Age:    56 years    BP:           157/67 mmHg Patient Gender: F           HR:           84 bpm. Exam Location:  Inpatient Procedure: Cardiac Doppler, Color Doppler and Limited Echo Indications:     I34.8 Other nonrheumatic mitral valve disorders.  History:         Patient has prior history of Echocardiogram examinations, most                  recent  10/18/2021. Abnormal ECG, Arrythmias:Bradycardia and                  RBBB; Risk Factors:Diabetes and Dyslipidemia.  Sonographer:     Roseanna Rainbow RDCS Referring Phys:  [id number] Rex Kras Diagnosing Phys: Rex Kras DO  Sonographer Comments: Technically difficult study due to poor echo windows and patient is obese. Image acquisition challenging due to patient body habitus. Limited echo to rule out MV and LVOT gradient and measure LV thickness. IMPRESSIONS  1. LV gradient at rest approximately 8.5 mmHg and with Valsalva 13 mmHg.Marland Kitchen Left ventricular ejection fraction, by estimation, is 60 to 65%. The left ventricle has normal function. The left ventricle has no regional wall motion abnormalities. moderate to severe left ventricular hypertrophy. Diastolic function could not be evaluated due to mitral annular calcification underlying rhythm.  2. Right ventricular systolic function is normal. The right ventricular size is normal. There is mildly elevated pulmonary artery systolic pressure. The estimated right ventricular systolic pressure is 62.6 mmHg.  3. Left atrial size was mildly dilated.  4. A small pericardial effusion is present. The pericardial effusion is posterior to the left ventricle. There is no evidence of cardiac tamponade.  5. The mitral valve is degenerative. No evidence of mitral valve regurgitation. Degree of mitral stenosis is present, difficult to quantify given the underlying rhythm, fusion of E and A, and HR variability mitral stenosis. Moderate to severe mitral annular calcification.  6. The aortic valve is tricuspid. Aortic valve regurgitation is not visualized. Aortic valve sclerosis is present, with no evidence of aortic valve stenosis.  7. The inferior vena cava is normal in size with greater than 50% respiratory variability, suggesting right atrial pressure of 3 mmHg. FINDINGS  Left Ventricle: LV gradient at rest approximately 8.5 mmHg and with Valsalva 13 mmHg. Left ventricular ejection  fraction, by estimation, is 60 to 65%. The left ventricle has normal function. The left ventricle has no regional wall motion abnormalities. Moderate to severe left ventricular hypertrophy. Diastolic function could not be evaluated due to mitral annular calcification underlying rhythm. Right Ventricle: The right ventricular size is normal. No increase in right ventricular  wall thickness. Right ventricular systolic function is normal. There is mildly elevated pulmonary artery systolic pressure. The tricuspid regurgitant velocity is 3.21  m/s, and with an assumed right atrial pressure of 3 mmHg, the estimated right ventricular systolic pressure is 69.6 mmHg. Left Atrium: Left atrial size was mildly dilated. Right Atrium: Right atrial size was normal in size. Pericardium: A small pericardial effusion is present. The pericardial effusion is posterior to the left ventricle. There is no evidence of cardiac tamponade. Mitral Valve: The mitral valve is degenerative in appearance. There is moderate calcification of the posterior mitral valve leaflet(s). Mildly decreased mobility of the mitral valve leaflets. Moderate to severe mitral annular calcification. Degree of mitral stenosis is present, difficult to quantify given the underlying rhythm, fusion of E and A, and HR variability mitral valve stenosis. MV peak gradient, 19.1 mmHg. The mean mitral valve gradient is 9.0 mmHg. Tricuspid Valve: The tricuspid valve is grossly normal. Tricuspid valve regurgitation is mild . No evidence of tricuspid stenosis. Aortic Valve: The aortic valve is tricuspid. Aortic valve regurgitation is not visualized. Aortic valve sclerosis is present, with no evidence of aortic valve stenosis. Aortic valve mean gradient measures 8.0 mmHg. Aortic valve peak gradient measures 14.4 mmHg. Aortic valve area, by VTI measures 2.50 cm. Pulmonic Valve: The pulmonic valve was grossly normal. Pulmonic valve regurgitation is trivial. No evidence of pulmonic  stenosis. Aorta: The aortic root is normal in size and structure. Venous: The inferior vena cava is normal in size with greater than 50% respiratory variability, suggesting right atrial pressure of 3 mmHg. LEFT VENTRICLE PLAX 2D LVIDd:         4.60 cm LVIDs:         3.30 cm LV PW:         1.60 cm LV IVS:        1.70 cm LVOT diam:     2.10 cm LV SV:         100 LV SV Index:   53 LVOT Area:     3.46 cm  LV Volumes (MOD) LV vol d, MOD A2C: 99.7 ml LV vol d, MOD A4C: 121.0 ml LV vol s, MOD A2C: 35.6 ml LV vol s, MOD A4C: 55.1 ml LV SV MOD A2C:     64.1 ml LV SV MOD A4C:     121.0 ml LV SV MOD BP:      67.2 ml IVC IVC diam: 1.80 cm LEFT ATRIUM         Index LA diam:    4.00 cm 2.10 cm/m  AORTIC VALVE AV Area (Vmax):    2.81 cm AV Area (Vmean):   2.64 cm AV Area (VTI):     2.50 cm AV Vmax:           190.00 cm/s AV Vmean:          134.000 cm/s AV VTI:            0.401 m AV Peak Grad:      14.4 mmHg AV Mean Grad:      8.0 mmHg LVOT Vmax:         154.00 cm/s LVOT Vmean:        102.000 cm/s LVOT VTI:          0.290 m LVOT/AV VTI ratio: 0.72  AORTA Ao Root diam: 3.50 cm MITRAL VALVE                TRICUSPID VALVE MV Area (PHT): 5.38 cm  TR Peak grad:   41.2 mmHg MV Area VTI:   2.30 cm     TR Vmax:        321.00 cm/s MV Peak grad:  19.1 mmHg MV Mean grad:  9.0 mmHg     SHUNTS MV Vmax:       2.18 m/s     Systemic VTI:  0.29 m MV Vmean:      138.5 cm/s   Systemic Diam: 2.10 cm MV Decel Time: 141 msec MV E velocity: 133.00 cm/s MV A velocity: 180.00 cm/s MV E/A ratio:  0.74 Offie Pickron DO Electronically signed by Rex Kras DO Signature Date/Time: 10/19/2021/6:41:43 PM    Final    ECHOCARDIOGRAM COMPLETE  Result Date: 10/19/2021    ECHOCARDIOGRAM REPORT   Patient Name:   Connie Swanson Date of Exam: 10/18/2021 Medical Rec #:  [id number]   Height:       62.0 in Accession #:    [id number]  Weight:       200.0 lb Date of Birth:  May 13, 1940   BSA:          1.912 m Patient Age:    69 years    BP:           183/49 mmHg  Patient Gender: F           HR:           46 bpm. Exam Location:  Inpatient Procedure: 2D Echo, Cardiac Doppler and Color Doppler Indications:    Heart block, Complete I44.2  History:        Patient has prior history of Echocardiogram examinations, most                 recent 05/07/2020. CHF, CAD, Arrythmias:RBBB; Risk                 Factors:Diabetes, Dyslipidemia and Hypertension.  Sonographer:    Ronny Flurry Sonographer#2:  Raquel Sarna Senior Referring Phys: Rex Kras DO IMPRESSIONS  1. Left ventricular ejection fraction, by estimation, is 60 to 65%. The left ventricle has normal function. The left ventricle has no regional wall motion abnormalities. Left ventricular diastolic function could not be evaluated due to MAC and heart block.  2. Right ventricular systolic function is normal. The right ventricular size is normal.  3. Left atrial size was mildly dilated.  4. Cannot comment on mitral stenosis due to underlying rhythm and moderate-severe MAC posteriorly. The mitral valve is degenerative. No evidence of mitral valve regurgitation. Moderate mitral annular calcification.  5. The aortic valve is tricuspid. Aortic valve regurgitation is not visualized. Aortic valve sclerosis is present, with no evidence of aortic valve stenosis.  6. The inferior vena cava is dilated in size with >50% respiratory variability, suggesting right atrial pressure of 8 mmHg.  7. Rhythm strip during this exam demonstrates Sinus with variable heart block. Conclusion(s)/Recommendation(s): Recommend limited echo to re-evaluate left ventricular chamber quantification (images are missing), evaluate for hyperdynamic LV gradients, and re-evaluate for mitral stenosis. FINDINGS  Left Ventricle: Left ventricular ejection fraction, by estimation, is 60 to 65%. The left ventricle has normal function. The left ventricle has no regional wall motion abnormalities. The left ventricular internal cavity size was normal in size. Left ventricular  diastolic function could not be evaluated due to MAC and heart block. Right Ventricle: The right ventricular size is normal. No increase in right ventricular wall thickness. Right ventricular systolic function is normal. Left Atrium: Left atrial size was mildly dilated. Right  Atrium: Right atrial size was normal in size. Pericardium: There is no evidence of pericardial effusion. Mitral Valve: Cannot comment on mitral stenosis due to underlying rhythm and moderate-severe MAC posteriorly. The mitral valve is degenerative in appearance. Moderate mitral annular calcification. No evidence of mitral valve regurgitation. MV peak gradient, 14.4 mmHg. Tricuspid Valve: The tricuspid valve is grossly normal. Tricuspid valve regurgitation is mild . No evidence of tricuspid stenosis. Aortic Valve: The aortic valve is tricuspid. Aortic valve regurgitation is not visualized. Aortic valve sclerosis is present, with no evidence of aortic valve stenosis. Aortic valve mean gradient measures 7.5 mmHg. Aortic valve peak gradient measures 13.0 mmHg. Aortic valve area, by VTI measures 2.35 cm. Pulmonic Valve: The pulmonic valve was grossly normal. Pulmonic valve regurgitation is trivial. No evidence of pulmonic stenosis. Aorta: The aortic root is normal in size and structure. Venous: The inferior vena cava is dilated in size with greater than 50% respiratory variability, suggesting right atrial pressure of 8 mmHg. IAS/Shunts: The atrial septum is grossly normal. EKG: Rhythm strip during this exam demonstrates Sinus with variable heart block.  LEFT VENTRICLE PLAX 2D LVOT diam:     2.00 cm   Diastology LV SV:         104       LV e' lateral:   9.81 cm/s LV SV Index:   54        LV E/e' lateral: 18.7 LVOT Area:     3.14 cm  RIGHT VENTRICLE             IVC RV S prime:     11.00 cm/s  IVC diam: 2.20 cm TAPSE (M-mode): 1.7 cm LEFT ATRIUM             Index        RIGHT ATRIUM           Index LA Vol (A2C):   87.8 ml 45.93 ml/m  RA Area:      19.10 cm LA Vol (A4C):   71.9 ml 37.61 ml/m  RA Volume:   49.80 ml  26.05 ml/m LA Biplane Vol: 84.2 ml 44.04 ml/m  AORTIC VALVE AV Area (Vmax):    2.18 cm AV Area (Vmean):   2.16 cm AV Area (VTI):     2.35 cm AV Vmax:           180.00 cm/s AV Vmean:          126.500 cm/s AV VTI:            0.443 m AV Peak Grad:      13.0 mmHg AV Mean Grad:      7.5 mmHg LVOT Vmax:         124.80 cm/s LVOT Vmean:        86.900 cm/s LVOT VTI:          0.331 m LVOT/AV VTI ratio: 0.75  AORTA Ao Root diam: 3.10 cm MITRAL VALVE                TRICUSPID VALVE MV Area (PHT): 3.89 cm     TR Peak grad:   34.1 mmHg MV Area VTI:   1.19 cm     TR Vmax:        292.00 cm/s MV Peak grad:  14.4 mmHg MV Vmax:       1.90 m/s     SHUNTS MV Vmean:      66.1 cm/s    Systemic VTI:  0.33 m MV Decel  Time: 195 msec     Systemic Diam: 2.00 cm MV E velocity: 183.00 cm/s MV A velocity: 133.00 cm/s MV E/A ratio:  1.38 Jalana Moore DO Electronically signed by Rex Kras DO Signature Date/Time: 10/19/2021/6:19:55 PM    Final     CARDIAC DATABASE: EKG: 10/17/2021: Sinus bradycardia with complete heart block, 38 bpm, left axis, left anterior fascicular block, right bundle branch block, cannot rule out anterolateral ischemia.  10/18/2021: Sinus rhythm 65 bpm 2:1 conduction disease, left bundle branch block, QRS 140 ms   Echocardiogram: 10/18/2021: LVEF 60-65%, no regional wall motion abnormalities, mildly dilated left atrium, moderate to severe MAC, estimated RAP 8 mmHg.  Underlying rhythm illustrates sinus with variable heart block.  Recommendation limited echo to reevaluate left ventricular chamber quantification (as images are missing), evaluate for hyperdynamic LV gradient, and reevaluate for mitral stenosis.  10/19/2021 (limited echo): Moderate to severe LVH. LV gradient at rest 8.5 mmHg with Valsalva 30 mmHg.  LVEF 60 to 65%. Diastolic dysfunction not assessed due to MAC underlying rhythm. Small pericardial effusion without  tamponade. Moderate to severe MAC. Degree of mitral stenosis is present, difficult to quantify the severity given the underlying rhythm, fusion of E and A waves, and heart rate variability.    Stress Testing: Lexiscan Tetrofosmin stress test 05/11/2020: Lexiscan nuclear stress test performed using 1-day protocol. SPECT images show uniform breast tissue attenuation in both rest and stress images. In addition, there is medium sized, medium intensity, fixed perfusion defect in basal inferior/inferoseptal myocardium. All segments of left ventricle demonstrated septal dyskinesis, global hypokinesis. Stress LVEF 34%. High risk study due to low stress LVEF.   Heart Catheterization: 10/10/2010: HEMODYNAMIC DATA:  Right heart catheterization:  RA pressure 7/7, mean 6 mmHg.  RA saturation 76%.   RV pressure 27/7, end-diastolic pressure 4 mmHg.   PA pressure 27/40 with a mean of 21 mmHg.  PA saturation was 75%.   Pulmonary capillary wedge 6/5 with a mean of 3 mmHg.  Aortic saturation was 100%.   Cardiac output was 6.4 with a cardiac index of 3.34 by Fick.   Right coronary artery:  Right coronary artery is a large caliber and superdominant vessel.  Gives origin to large PL branch and 2 PDA large branches.  Smooth and normal.   Left main coronary artery:  Left main coronary artery is a large-caliber vessel, which is smooth and normal.   Circumflex coronary artery:  Circumflex coronary artery is a very large- caliber vessel.  Gives origin to a moderate-sized obtuse marginal 1 and continues distally as a large obtuse marginal 2.  Smooth and normal.   LAD:  LAD is a large-caliber vessel in the proximal segment.  Gives origin to a moderate-sized diagonal 1 and then gives origin to a very large diagonal 2, which is bigger than the LAD itself.  Between diagonal 1 and diagonal 2, there is a eccentric 50% stenosis noted in the LAD. The diagonal 2, which is very large has a ostial 30%  stenosis. Otherwise, except for this focal stenosis of 50%, there is no other lesion noted in the LAD or the large diagonal 2.  Scheduled Meds:  allopurinol  300 mg Oral Daily   atorvastatin  40 mg Oral Daily   calcitRIOL  0.25 mcg Oral Q M,W,F   heparin  5,000 Units Subcutaneous Q8H   insulin aspart  0-15 Units Subcutaneous TID WC   insulin aspart  0-5 Units Subcutaneous QHS   isosorbide-hydrALAZINE  1 tablet Oral TID  Continuous Infusions:   PRN Meds: hydrALAZINE   IMPRESSION & RECOMMENDATIONS: Connie Swanson is a 80 y.o. Caucasian female whose past medical history and cardiac risk factors include: HFpEF, single-vessel CAD (nonobstructive), insulin-dependent diabetes mellitus type 2, chronic kidney disease, hypertension, history of ischemic colitis, hypothyroidism, obesity due to excess calories, vertigo, history of COVID-19 infection (January 2022), postmenopausal female, advanced age.  Impression: Symptomatic bradycardia Conduction disease of the heart (on arrival complete heart block, now 2:1 conduction) Leukocytosis-likely reactive. Chronic HFpEF. Acute kidney injury on chronic kidney disease Insulin-dependent diabetes mellitus type 2 Single-vessel CAD Hyperlipidemia Hypertension with chronic kidney disease stage IV  Recommendations: Symptomatic bradycardia/conduction disease of the heart/baseline left bundle branch block: Symptomatic on arrival, but hemodynamically stable. Was in complete heart block on arrival, yesterday noted to be in 2-1 conduction, now more SR w/ intermittent pause/AVB.  EP recommends monitoring for another 24 hrs - reasonable.  Reversible cause: Last dose of carvedilol (morning of October 17, 2021).  TSH within normal limits. Echocardiogram results reviewed.  Evaluated by EP -recommendations appreciated.  Monitor for now. N.p.o. after midnight  Leukocytosis: Likely secondary to recent COVID-19 and influenza immunization.  Procalcitonin  within normal limits UA within acceptable limits No febrile episodes 20 pathogen respiratory panel negative Monitor for now  Chronic HFpEF: Stage B, NYHA class II: Currently euvolemic. We will hold off Jardiance/Entresto secondary to acute kidney injury. Restart BiDil. Monitor for now  Insulin-dependent diabetes mellitus type 2: Currently on sliding scale insulin  Hyperlipidemia: Continue statin therapy  Hypertension with chronic kidney disease stage IV Last creatinine in April 2023 to 1.6 mg/dL Due to rising creatinine PCP recently stopped metformin 3 days prior to admission. Monitor BUN and creatinine Given the underlying conduction disease we will hold off on aggressively addressing her blood pressure. Restart BiDil  Consultants: Cardiac electrophysiology   Code Status: Full   Family Communication: Spoke to the patient's daughter Anderson Malta (yesterday)   Disposition Plan: Home when medically safe  Patient's questions and concerns were addressed to her satisfaction. She voices understanding of the instructions provided during this encounter.   This note was created using a voice recognition software as a result there may be grammatical errors inadvertently enclosed that do not reflect the nature of this encounter. Every attempt is made to correct such errors.  Total time spent: 38 minutes.  Mechele Claude [facility]  Pager: [phone] Office: [phone] 10/19/2021, 6:45 PM

## 2021-10-19 NOTE — Evaluation (Signed)
Physical Therapy Evaluation Patient Details Name: Connie Swanson MRN: [id number] DOB: 04-17-1940 Today's Date: 10/19/2021  History of Present Illness  The pt is an 81 yo female presenting 10/10 from outpatient cardiology appointment due to bradycardia. Pt found to have complete heart block, undergoing workup for pacemaker placement. PMH includes: HTN, HFpEF, DM II, hypothyroidism, obesity, vertigo, CAD, CKD V.   Clinical Impression  Pt in bed upon arrival of PT, agreeable to evaluation at this time. Prior to admission the pt was ambulating with use of rollator at home, reports independent with driving, running errands, but does live with her daughter and son-in-law who can assist as needed. The pt now presents with limitations in functional mobility, LE power, and endurance due to above dx, and will continue to benefit from skilled PT to address these deficits. The pt reports her mobility deficits are chronic and her SOB and fatigue with exertion are baseline for her at home. She does not engage in any regular activity or exercise PTA. The pt will benefit from continued skilled PT acutely to progress cardiovascular endurance and provide education regarding HEP and progressive walking program for home. No follow up therapies needed at this time.      Recommendations for follow up therapy are one component of a multi-disciplinary discharge planning process, led by the attending physician.  Recommendations may be updated based on patient status, additional functional criteria and insurance authorization.  Follow Up Recommendations No PT follow up      Assistance Recommended at Discharge Intermittent Supervision/Assistance  Patient can return home with the following  Assistance with cooking/housework;Assist for transportation;Help with stairs or ramp for entrance    Equipment Recommendations None recommended by PT  Recommendations for Other Services       Functional Status Assessment Patient has  had a recent decline in their functional status and demonstrates the ability to make significant improvements in function in a reasonable and predictable amount of time.     Precautions / Restrictions Precautions Precautions: Fall Precaution Comments: watch SpO2 Restrictions Weight Bearing Restrictions: No      Mobility  Bed Mobility Overal bed mobility: Needs Assistance Bed Mobility: Sit to Supine       Sit to supine: Min guard   General bed mobility comments: pt able to complete without assist, increased effort to scoot and reposition in bed    Transfers Overall transfer level: Needs assistance Equipment used: Rolling walker (2 wheels) Transfers: Sit to/from Stand Sit to Stand: Supervision           General transfer comment: dependent on use of hands, poor LE power in BLE. no overt LOB    Ambulation/Gait Ambulation/Gait assistance: Supervision Gait Distance (Feet): 200 Feet Assistive device: Rolling walker (2 wheels) Gait Pattern/deviations: Step-through pattern, Decreased stride length Gait velocity: decreased Gait velocity interpretation: <1.31 ft/sec, indicative of household ambulator   General Gait Details: pt with small stride but stable, reports this is similar to mobility at home  Stairs Stairs: Yes Stairs assistance: Mod assist Stair Management: One rail Right, Step to pattern, Forwards Number of Stairs: 2 General stair comments: modA through HHA on L hand, R hand on rail. pt reports she never does stairs at home without assist     Balance Overall balance assessment: Needs assistance Sitting-balance support: No upper extremity supported Sitting balance-Leahy Scale: Good     Standing balance support: Bilateral upper extremity supported, During functional activity Standing balance-Leahy Scale: Fair  Pertinent Vitals/Pain Pain Assessment Pain Assessment: No/denies pain    Home Living Family/patient  expects to be discharged to:: Private residence Living Arrangements: Children Available Help at Discharge: Family;Available PRN/intermittently (daughter works from home) Type of Home: House Home Access: Stairs to enter Entrance Stairs-Rails: Right Technical brewer of Steps: 2   Home Layout: One level Home Equipment: Rollator (4 wheels)      Prior Function Prior Level of Function : Independent/Modified Independent;Driving;History of Falls (last six months)             Mobility Comments: uses rollator in the home, uses electric scooter when running errands ADLs Comments: independent     Hand Dominance   Dominant Hand: Right    Extremity/Trunk Assessment   Upper Extremity Assessment Upper Extremity Assessment: Overall WFL for tasks assessed    Lower Extremity Assessment Lower Extremity Assessment: Overall WFL for tasks assessed    Cervical / Trunk Assessment Cervical / Trunk Assessment: Other exceptions Cervical / Trunk Exceptions: large body habitus  Communication   Communication: No difficulties  Cognition Arousal/Alertness: Awake/alert Behavior During Therapy: WFL for tasks assessed/performed Overall Cognitive Status: Within Functional Limits for tasks assessed                                          General Comments General comments (skin integrity, edema, etc.): SpO2 to low of 86% on RA after 150 ft, recovered to 90s with standing rest    Exercises     Assessment/Plan    PT Assessment Patient needs continued PT services  PT Problem List Decreased strength;Decreased range of motion;Decreased activity tolerance;Decreased balance;Decreased mobility       PT Treatment Interventions DME instruction;Gait training;Stair training;Functional mobility training;Therapeutic activities;Therapeutic exercise;Balance training;Neuromuscular re-education;Patient/family education    PT Goals (Current goals can be found in the Care Plan section)   Acute Rehab PT Goals Patient Stated Goal: return home PT Goal Formulation: With patient Time For Goal Achievement: 11/02/21 Potential to Achieve Goals: Good    Frequency Min 3X/week        AM-PAC PT "6 Clicks" Mobility  Outcome Measure Help needed turning from your back to your side while in a flat bed without using bedrails?: A Little Help needed moving from lying on your back to sitting on the side of a flat bed without using bedrails?: A Little Help needed moving to and from a bed to a chair (including a wheelchair)?: A Little Help needed standing up from a chair using your arms (e.g., wheelchair or bedside chair)?: A Little Help needed to walk in hospital room?: A Little Help needed climbing 3-5 steps with a railing? : A Little 6 Click Score: 18    End of Session Equipment Utilized During Treatment: Gait belt Activity Tolerance: Patient tolerated treatment well;Patient limited by fatigue Patient left: in bed;with call bell/phone within reach Nurse Communication: Mobility status PT Visit Diagnosis: Other abnormalities of gait and mobility (R26.89);Muscle weakness (generalized) (M62.81)    Time: 8299-3716 PT Time Calculation (min) (ACUTE ONLY): 26 min   Charges:   PT Evaluation $PT Eval Low Complexity: 1 Low PT Treatments $Therapeutic Exercise: 8-22 mins        West Carbo, PT, DPT   Acute Rehabilitation Department  Sandra Cockayne 10/19/2021, 3:51 PM

## 2021-10-19 NOTE — Progress Notes (Signed)
Mobility Specialist - Progress Note   10/19/21 0912  Mobility  Activity Ambulated with assistance in hallway  Level of Assistance Standby assist, set-up cues, supervision of patient - no hands on  Assistive Device Front wheel walker  Distance Ambulated (ft) 300 ft  Activity Response Tolerated well  Mobility Referral Yes  $Mobility charge 1 Mobility    Pre-mobility:83 HR During mobility:104 HR  Pt was received in bed and agreeable to mobility. Pt c/o leg soreness throughout ambulation. Pt was returned to bed with all needs met.   Larey Seat

## 2021-10-19 NOTE — Consult Note (Addendum)
Cardiology Consultation   Patient ID: LADDIE NAEEM MRN: [id number]; DOB: November 23, 1940  Admit date: 10/17/2021 Date of Consult: 10/19/2021  PCP:  Haywood Pao, MD   [facility] Providers Cardiologist:  Dr. Terri Skains   Patient Profile:   Connie Swanson is a 81 y.o. female with a hx of HFpEF, DM, HTN, hypothyroidism, obesity, vertigo, CAD (single vessel non-obstructive in review of Dr. Brennan Bailey note), CKD (IV),  who is being seen for the evaluation of CHB at the request of Dr. Terri Skains.  History of Present Illness:   Ms. Maultsby saw Dr. Terri Skains on 10/10 at a previously scheduled 6 mo visit with c/o of unusual fatigue and lower BPs at home, she was bradycardic her EKG noting CHB, she was referred to the ER for further evaluation and management.  Significant dyspnea on exertion limited to about 25 feet.  Chronic apnea and sleeps in a recliner and has for many years.  Dependent edema.  Modest hypertension relatively well controlled on medicine.  Denies orthostasis.  Denies carpal tunnel symptoms.  Cardiac evaluation 5/22 had discordant data related to ejection fraction with a Myoview at 35% and echo at 55% and echocardiogram reporting severe left ventricular hypertrophy with a septum of 35 mm in the posterior wall of 19 mm.  Brother died suddenly at the age of 33 no other history of premature death  LABS at admission K+ 4.2, 4.9 BUN/Creat 34/2.09 (baseline looks 1.4-1.6 HS Trop 13, 20 WBC 12.3 > 10.9 (afebrile) H/H 10.2/30 Plts 268 TSH 2.542  HOME meds include: COREG 25mg  BID, last does 10/10 AM  Overnight updates: NAEON. Feels well this AM, no acute concerns/complaitns  Past Medical History:  Diagnosis Date   CHF (congestive heart failure) (HCC)    Chronic kidney disease    Coronary artery disease    Diabetes mellitus without complication ([place])    Hyperlipidemia    Hypertension     Past Surgical History:  Procedure Laterality Date   BREAST EXCISIONAL BIOPSY Right     HEMORRHOID SURGERY     MENISCUS REPAIR     THYROID SURGERY     VAGINAL HYSTERECTOMY       Home Medications:  Prior to Admission medications   Medication Sig Start Date End Date Taking? Authorizing Provider  allopurinol (ZYLOPRIM) 300 MG tablet Take 300 mg by mouth daily.   Yes [provider]  atorvastatin (LIPITOR) 40 MG tablet Take 40 mg by mouth daily. 03/04/18  Yes [provider]  bumetanide (BUMEX) 0.5 MG tablet Take 1 tablet (0.5 mg total) by mouth as needed (Weight gain >1lb over 24hr or >3lb over 7 days.). Patient taking differently: Take 0.5 mg by mouth as needed (weight gain >1lb over 24 hrs or >3lb over 7 days). 01/16/21 10/17/21 Yes Tolia, Sunit, DO  calcitRIOL (ROCALTROL) 0.25 MCG capsule Take 0.25 mcg by mouth every other day. Mon, Wed, Fri   Yes [provider]  carvedilol (COREG) 25 MG tablet Take 25 mg by mouth 2 (two) times daily. 09/11/19  Yes [provider]  Cholecalciferol (VITAMIN D3) 1.25 MG (50000 UT) TABS Take 1 tablet by mouth daily at 12 noon.   Yes [provider]  denosumab (PROLIA) 60 MG/ML SOSY injection Inject 60 mg into the skin every 6 (six) months.   Yes [provider]  empagliflozin (JARDIANCE) 10 MG TABS tablet Take 1 tablet by mouth daily at 12 noon.   Yes [provider]  hydrALAZINE (APRESOLINE) 50 MG  tablet TAKE 1 TABLET BY MOUTH THREE TIMES DAILY Patient taking differently: Take 50 mg by mouth 3 (three) times daily. 09/01/21  Yes Tolia, Sunit, DO  isosorbide mononitrate (IMDUR) 30 MG 24 hr tablet Take 1/2 (one-half) tablet by mouth once daily Patient taking differently: Take 30 mg by mouth daily. 10/10/21  Yes Tolia, Sunit, DO  sacubitril-valsartan (ENTRESTO) 49-51 MG Take 1 tablet by mouth 2 (two) times daily. 12/14/20  Yes Tolia, Sunit, DO  B-D INS SYR ULTRAFINE 1CC/30G 30G X 1/2" 1 ML MISC USE SYRINGE AS DIRECTED TWICE DAILY 08/26/19   [provider]    Inpatient  Medications: Scheduled Meds:  allopurinol  300 mg Oral Daily   atorvastatin  40 mg Oral Daily   calcitRIOL  0.25 mcg Oral Q M,W,F   heparin  5,000 Units Subcutaneous Q8H   insulin aspart  0-15 Units Subcutaneous TID WC   insulin aspart  0-5 Units Subcutaneous QHS   Continuous Infusions:  PRN Meds: hydrALAZINE  Allergies:    Allergies  Allergen Reactions   Dapagliflozin Nausea And Vomiting    Social History:   Social History   Socioeconomic History   Marital status: Widowed    Spouse name: Not on file   Number of children: 4   Years of education: Not on file   Highest education level: Not on file  Occupational History   Not on file  Tobacco Use   Smoking status: Never   Smokeless tobacco: Never  Vaping Use   Vaping Use: Never used  Substance and Sexual Activity   Alcohol use: Never   Drug use: Never   Sexual activity: Not on file  Other Topics Concern   Not on file  Social History Narrative   Not on file   Social Determinants of Health   Financial Resource Strain: Not on file  Food Insecurity: No Food Insecurity (10/18/2021)   Hunger Vital Sign    Worried About Running Out of Food in the Last Year: Never true    Ran Out of Food in the Last Year: Never true  Transportation Needs: No Transportation Needs (10/18/2021)   PRAPARE - Hydrologist (Medical): No    Lack of Transportation (Non-Medical): No  Physical Activity: Not on file  Stress: Not on file  Social Connections: Not on file  Intimate Partner Violence: Not At Risk (10/18/2021)   Humiliation, Afraid, Rape, and Kick questionnaire    Fear of Current or Ex-Partner: No    Emotionally Abused: No    Physically Abused: No    Sexually Abused: No    Family History:   Family History  Adopted: Yes     ROS:  Please see the history of present illness.  All other ROS reviewed and negative.     Physical Exam/Data:   Vitals:   10/18/21 1707 10/18/21 2015 10/19/21 0430  10/19/21 0733  BP: (!) 183/60 (!) 151/50 (!) 185/49 (!) 157/67  Pulse: (!) 30 (!) 53 92 80  Resp: 12  20 18   Temp: 98.4 F (36.9 C) 98.4 F (36.9 C) 98.5 F (36.9 C) 98.5 F (36.9 C)  TempSrc: Oral Oral Oral Oral  SpO2: 94%  94% 95%  Weight:   89.8 kg   Height:        Intake/Output Summary (Last 24 hours) at 10/19/2021 0809 Last data filed at 10/18/2021 1400 Gross per 24 hour  Intake 240 ml  Output 200 ml  Net 40 ml  10/19/2021    4:30 AM 10/18/2021    1:27 PM 10/17/2021    3:34 PM  Last 3 Weights  Weight (lbs) 197 lb 14.4 oz 200 lb 200 lb  Weight (kg) 89.767 kg 90.719 kg 90.719 kg     Body mass index is 36.2 kg/m.   No significant change from prior General:  Well nourished, well developed, in no acute distress  HEENT: normal Neck: JVD at clavicle at 30 degrees Vascular: No carotid bruits; Distal pulses 2+ bilaterally Cardiac:  normal S1, S2; RRR; no murmur  Lungs:  clear to auscultation bilaterally, no wheezing, rhonchi or rales  Abd: soft, nontender, no hepatomegaly  Ext: no edema Musculoskeletal:  No deformities, BUE and BLE strength normal and equal Skin: warm and dry  Neuro:  CNs 2-12 intact, no focal abnormalities noted Psych:  Normal affect   EKG:  The EKG was personally reviewed and demonstrates:   10/18/2021 sinus rhythm at 65 with 2: 1 conduction and a left bundle branch block with a QRSd of about 140 ms 10/17/2021 CHB 38bpm, RBBB, LAD  OLD 10/19/20: SR 79bpm, LBBB (148), LAD  Telemetry:  intermittent mobitz 3:1, without increased P-R. Majority of the time is SR in 70-80s. (Personally reviewed)  Relevant CV Studies:  Echocardiogram: 05/04/2020: Normal LV systolic function with visual EF 50-55%. Left ventricle cavity is normal in size. Severe concentric hypertrophy of the left ventricle. Normal global wall motion. Unable to evaluate diastolic function due to mitral annular calcification. Elevated LAP.  Native mitral valve. Posterior annular  calcification. No evidence of mitral stenosis. Mild (Grade I) mitral regurgitation. Mild tricuspid regurgitation. No evidence of pulmonary hypertension. RVSP measures 32 mmHg. No prior studies available for comparison.   Stress Testing: Lexiscan Tetrofosmin stress test 05/11/2020: Lexiscan nuclear stress test performed using 1-day protocol. SPECT images show uniform breast tissue attenuation in both rest and stress images. In addition, there is medium sized, medium intensity, fixed perfusion defect in basal inferior/inferoseptal myocardium. All segments of left ventricle demonstrated septal dyskinesis, global hypokinesis. Stress LVEF 34%. High risk study due to low stress LVEF.   Heart Catheterization: 10/10/2010: HEMODYNAMIC DATA:  Right heart catheterization:  RA pressure 7/7, mean 6 mmHg.  RA saturation 76%.   RV pressure 56/4, end-diastolic pressure 4 mmHg.   PA pressure 27/40 with a mean of 21 mmHg.  PA saturation was 75%.   Pulmonary capillary wedge 6/5 with a mean of 3 mmHg.  Aortic saturation was 100%.   Cardiac output was 6.4 with a cardiac index of 3.34 by Fick.   Right coronary artery:  Right coronary artery is a large caliber and superdominant vessel.  Gives origin to large PL branch and 2 PDA large branches.  Smooth and normal.   Left main coronary artery:  Left main coronary artery is a large-caliber vessel, which is smooth and normal.   Circumflex coronary artery:  Circumflex coronary artery is a very large- caliber vessel.  Gives origin to a moderate-sized obtuse marginal 1 and continues distally as a large obtuse marginal 2.  Smooth and normal.   LAD:  LAD is a large-caliber vessel in the proximal segment.  Gives origin to a moderate-sized diagonal 1 and then gives origin to a very large diagonal 2, which is bigger than the LAD itself.  Between diagonal 1 and diagonal 2, there is a eccentric 50% stenosis noted in the LAD. The diagonal 2, which is very large has  a ostial 30% stenosis. Otherwise, except for this focal  stenosis of 50%, there is no other lesion noted in the LAD or the large diagonal 2.    Laboratory Data:  High Sensitivity Troponin:   Recent Labs  Lab 10/17/21 1540 10/17/21 1921  TROPONINIHS 13 20*      Chemistry Recent Labs  Lab 10/17/21 1540 10/17/21 1921 10/18/21 0411 10/19/21 0040  NA 139  --  139 137  K 4.2  --  4.9 4.5  CL 107  --  108 108  CO2 22  --  24 21*  GLUCOSE 196*  --  131* 207*  BUN 29*  --  34* 35*  CREATININE 2.03* 2.05* 2.09* 2.07*  CALCIUM 9.2  --  9.1 9.3  GFRNONAA 24* 24* 23* 23*  ANIONGAP 10  --  7 8     Recent Labs  Lab 10/17/21 1540  PROT 6.4*  ALBUMIN 2.8*  AST 13*  ALT 9  ALKPHOS 56  BILITOT 0.4    Lipids No results for input(s): "CHOL", "TRIG", "HDL", "LABVLDL", "LDLCALC", "CHOLHDL" in the last 168 hours.  Hematology Recent Labs  Lab 10/17/21 1540 10/18/21 0411 10/19/21 0040  WBC 12.3* 10.9* 13.1*  RBC 3.42* 3.03* 3.00*  HGB 11.1* 10.2* 10.2*  HCT 35.1* 30.7* 30.3*  MCV 102.6* 101.3* 101.0*  MCH 32.5 33.7 34.0  MCHC 31.6 33.2 33.7  RDW 15.9* 16.0* 16.0*  PLT 268 268 258    Thyroid  Recent Labs  Lab 10/17/21 1540  TSH 2.542     BNP Recent Labs  Lab 10/19/21 0040  BNP 688.8*    DDimer No results for input(s): "DDIMER" in the last 168 hours.   Radiology/Studies:  DG Chest Port 1 View  Result Date: 10/17/2021 CLINICAL DATA:  Bradycardia, history CHF, hypertension, coronary artery disease, diabetes mellitus, chronic kidney disease EXAM: PORTABLE CHEST 1 VIEW COMPARISON:  Portable exam 1602 hours compared to 04/28/2020 FINDINGS: External pacing leads project over chest. Enlargement of cardiac silhouette. Mediastinal contours and pulmonary vascularity normal. Atherosclerotic calcification aorta. Chronic accentuation of RIGHT upper lobe markings with volume loss question scarring. Central peribronchial thickening. Additional mild prominence of interstitial  markings mid to lower lungs unchanged. No definite acute infiltrate, pleural effusion or pneumothorax. Bones demineralized. IMPRESSION: Enlargement of cardiac silhouette. Bronchitic and chronic interstitial changes with RIGHT upper lobe scarring. No acute abnormalities. Aortic Atherosclerosis (ICD10-I70.0). Electronically Signed   By: Lavonia Dana M.D.   On: 10/17/2021 16:17     Assessment and Plan:   LBBB baseline  Syncope-recurrent-abrupt onset-offset  CHB with RBBB escape  LVH  (35/16 mm ???)  HFpEF  Renal insufficiency grade 4  Mild Leukocytosis, afebrile - wbc 10.9 >> 13.1  Anemia probably related to the above (macrocytic)  Has been off coreg for 36h now Having much more SR on tele overnight Did have one 5 beat run of NSVT - asymptomatic  Will watch tele closely today to determine need for PM No plans for procedure today, will make NPO at midnight tonight for possible PM tomorrow. Echo pending to eval LVH    Mamie Levers, NP 10/19/21 8:22 AM

## 2021-10-20 ENCOUNTER — Other Ambulatory Visit: Payer: Self-pay

## 2021-10-20 ENCOUNTER — Encounter (HOSPITAL_COMMUNITY): Payer: Self-pay | Admitting: *Deleted

## 2021-10-20 ENCOUNTER — Emergency Department (HOSPITAL_COMMUNITY): Payer: Medicare Other

## 2021-10-20 ENCOUNTER — Inpatient Hospital Stay (HOSPITAL_BASED_OUTPATIENT_CLINIC_OR_DEPARTMENT_OTHER)
Admit: 2021-10-20 | Discharge: 2021-10-20 | Disposition: A | Discharge status: Home / Self Care | Payer: Medicare Other | Attending: Physician Assistant | Admitting: Physician Assistant

## 2021-10-20 ENCOUNTER — Emergency Department (HOSPITAL_COMMUNITY)
Admission: EM | Admit: 2021-10-20 | Discharge: 2021-10-20 | Discharge status: Against Medical Advice | Payer: Medicare Other | Attending: Student | Admitting: Student

## 2021-10-20 DIAGNOSIS — R55 Syncope and collapse: Secondary | ICD-10-CM | POA: Insufficient documentation

## 2021-10-20 DIAGNOSIS — R110 Nausea: Secondary | ICD-10-CM | POA: Diagnosis not present

## 2021-10-20 DIAGNOSIS — I4430 Unspecified atrioventricular block: Secondary | ICD-10-CM | POA: Diagnosis not present

## 2021-10-20 DIAGNOSIS — I442 Atrioventricular block, complete: Secondary | ICD-10-CM | POA: Diagnosis not present

## 2021-10-20 DIAGNOSIS — Z5321 Procedure and treatment not carried out due to patient leaving prior to being seen by health care provider: Secondary | ICD-10-CM | POA: Insufficient documentation

## 2021-10-20 DIAGNOSIS — R079 Chest pain, unspecified: Secondary | ICD-10-CM | POA: Diagnosis not present

## 2021-10-20 LAB — CBC WITH DIFFERENTIAL/PLATELET
Abs Immature Granulocytes: 0.04 10*3/uL (ref 0.00–0.07)
Basophils Absolute: 0.1 10*3/uL (ref 0.0–0.1)
Basophils Relative: 1 %
Eosinophils Absolute: 0.8 10*3/uL — ABNORMAL HIGH (ref 0.0–0.5)
Eosinophils Relative: 7 %
HCT: 33.4 % — ABNORMAL LOW (ref 36.0–46.0)
Hemoglobin: 10.7 g/dL — ABNORMAL LOW (ref 12.0–15.0)
Immature Granulocytes: 0 %
Lymphocytes Relative: 13 %
Lymphs Abs: 1.6 10*3/uL (ref 0.7–4.0)
MCH: 32.5 pg (ref 26.0–34.0)
MCHC: 32 g/dL (ref 30.0–36.0)
MCV: 101.5 fL — ABNORMAL HIGH (ref 80.0–100.0)
Monocytes Absolute: 0.7 10*3/uL (ref 0.1–1.0)
Monocytes Relative: 6 %
Neutro Abs: 8.9 10*3/uL — ABNORMAL HIGH (ref 1.7–7.7)
Neutrophils Relative %: 73 %
Platelets: 298 10*3/uL (ref 150–400)
RBC: 3.29 MIL/uL — ABNORMAL LOW (ref 3.87–5.11)
RDW: 15.4 % (ref 11.5–15.5)
WBC: 12.1 10*3/uL — ABNORMAL HIGH (ref 4.0–10.5)
nRBC: 0 % (ref 0.0–0.2)

## 2021-10-20 LAB — BRAIN NATRIURETIC PEPTIDE: B Natriuretic Peptide: 204.9 pg/mL — ABNORMAL HIGH (ref 0.0–100.0)

## 2021-10-20 LAB — BASIC METABOLIC PANEL
Anion gap: 7 (ref 5–15)
Anion gap: 9 (ref 5–15)
BUN: 30 mg/dL — ABNORMAL HIGH (ref 8–23)
BUN: 31 mg/dL — ABNORMAL HIGH (ref 8–23)
CO2: 27 mmol/L (ref 22–32)
CO2: 25 mmol/L (ref 22–32)
Calcium: 9.7 mg/dL (ref 8.9–10.3)
Calcium: 9.9 mg/dL (ref 8.9–10.3)
Chloride: 106 mmol/L (ref 98–111)
Chloride: 104 mmol/L (ref 98–111)
Creatinine, Ser: 1.88 mg/dL — ABNORMAL HIGH (ref 0.44–1.00)
Creatinine, Ser: 1.99 mg/dL — ABNORMAL HIGH (ref 0.44–1.00)
GFR, Estimated: 26 mL/min — ABNORMAL LOW (ref >60)
GFR, Estimated: 24 mL/min — ABNORMAL LOW (ref >60)
Glucose, Bld: 137 mg/dL — ABNORMAL HIGH (ref 70–99)
Glucose, Bld: 106 mg/dL — ABNORMAL HIGH (ref 70–99)
Potassium: 4.6 mmol/L (ref 3.5–5.1)
Potassium: 4.6 mmol/L (ref 3.5–5.1)
Sodium: 140 mmol/L (ref 135–145)
Sodium: 138 mmol/L (ref 135–145)

## 2021-10-20 LAB — GLUCOSE, CAPILLARY
Glucose-Capillary: 141 mg/dL — ABNORMAL HIGH (ref 70–99)
Glucose-Capillary: 206 mg/dL — ABNORMAL HIGH (ref 70–99)

## 2021-10-20 LAB — TROPONIN I (HIGH SENSITIVITY): Troponin I (High Sensitivity): 44 ng/L — ABNORMAL HIGH (ref <18)

## 2021-10-20 MED ORDER — BUMETANIDE 0.5 MG PO TABS: 0.5000 mg | ORAL_TABLET | Freq: Every morning | ORAL | 0 refills | Status: DC

## 2021-10-20 NOTE — Progress Notes (Signed)
Called the patient's daughter Ms. Beth to get an update of Ms. Goya but reached her voicemail.  Left a message and will call back in the morning.    Rex Kras, [place], [facility]  Pager: [phone] Office: [phone]

## 2021-10-20 NOTE — ED Provider Triage Note (Signed)
Emergency Medicine Provider Triage Evaluation Note  Connie Swanson , a 81 y.o. female  was evaluated in triage.  Pt complains of syncope, states that she went to sit down felt as if the room was spinning her vision became dark and she sat into her chair.  There is no tongue biting no urinary or bowel incontinency's, patient states that she feels better now does not Dors any chest pain shortness of breath lightheaded dizziness paresthesia or weakness to upper or lower extremities.  Daughter was at bedside and states that she had passed out for proxy 30 seconds and came back to no postictal state.  Review of Systems  Positive: Faint, nausea Negative: HA, chest pain   Physical Exam  BP (!) 161/75   Pulse 77   Temp 98.6 F (37 C) (Oral)   Resp 18   Ht 5\' 2"  (1.575 m)   Wt 98.7 kg   SpO2 96%   BMI 39.80 kg/m  Gen:   Awake, no distress   Resp:  Normal effort  MSK:   Moves extremities without difficulty  Other:  Cranial nerves II through XII grossly intact no difficulty with word finding following two-step commands no unilateral weakness present.  Medical Decision Making  Medically screening exam initiated at 5:21 PM.  Appropriate orders placed.  [name] was informed that the remainder of the evaluation will be completed by another provider, this initial triage assessment does not replace that evaluation, and the importance of remaining in the ED until their evaluation is complete.  Lab work imaging been ordered will need further work-up.   Marcello Fennel, PA-C 10/20/21 1723

## 2021-10-20 NOTE — Progress Notes (Addendum)
Electrophysiology Rounding Note  Patient Name: Connie Swanson Date of Encounter: 10/20/2021  Primary Cardiologist: None Electrophysiologist: None   Subjective   NAEON. No acute complaints or needs this AM.   Inpatient Medications    Scheduled Meds:  allopurinol  300 mg Oral Daily   atorvastatin  40 mg Oral Daily   calcitRIOL  0.25 mcg Oral Q M,W,F   heparin  5,000 Units Subcutaneous Q8H   insulin aspart  0-15 Units Subcutaneous TID WC   insulin aspart  0-5 Units Subcutaneous QHS   isosorbide-hydrALAZINE  1 tablet Oral TID   Continuous Infusions:  PRN Meds: hydrALAZINE   Vital Signs    Vitals:   10/19/21 2011 10/20/21 0033 10/20/21 0419 10/20/21 0828  BP: (!) 158/63 (!) 151/68 (!) 154/67 (!) 159/74  Pulse: 82 78 77 79  Resp: _0 Temp: 98.4 F (36.9 C) 98.4 F (36.9 C) 98.3 F (36.8 C) 97.9 F (36.6 C)  TempSrc: Oral Oral Oral Oral  SpO2: 93% (!) 89% 96% 90%  Weight:   93.7 kg   Height:        Intake/Output Summary (Last 24 hours) at 10/20/2021 0901 Last data filed at 10/19/2021 2144 Gross per 24 hour  Intake 240 ml  Output --  Net 240 ml   Filed Weights   10/18/21 1327 10/19/21 0430 10/20/21 0419  Weight: 90.7 kg 89.8 kg 93.7 kg    Physical Exam    Grossly unchanged from prior.  GEN- The patient is well appearing, alert and oriented x 3 today.   Head- normocephalic, atraumatic Eyes-  Sclera clear, conjunctiva pink Ears- hearing intact Oropharynx- clear Neck- supple Lungs- Clear to ausculation bilaterally, normal work of breathing Heart- Regular rate and rhythm, no murmurs, rubs or gallops GI- soft, NT, ND, + BS Extremities- no clubbing or cyanosis. No edema Skin- no rash or lesion Psych- euthymic mood, full affect Neuro- strength and sensation are intact  Labs    CBC Recent Labs    10/17/21 1540 10/18/21 0411 10/19/21 0040  WBC 12.3* 10.9* 13.1*  NEUTROABS 9.3*  --   --   HGB 11.1* 10.2* 10.2*  HCT 35.1* 30.7* 30.3*   MCV 102.6* 101.3* 101.0*  PLT 268 268 233   Basic Metabolic Panel Recent Labs    10/19/21 0040 10/20/21 0330  NA 137 140  K 4.5 4.6  CL 108 106  CO2 21* 27  GLUCOSE 207* 137*  BUN 35* 30*  CREATININE 2.07* 1.88*  CALCIUM 9.3 9.7   Liver Function Tests Recent Labs    10/17/21 1540  AST 13*  ALT 9  ALKPHOS 56  BILITOT 0.4  PROT 6.4*  ALBUMIN 2.8*   No results for input(s): "LIPASE", "AMYLASE" in the last 72 hours. Cardiac Enzymes No results for input(s): "CKTOTAL", "CKMB", "CKMBINDEX", "TROPONINI" in the last 72 hours.   Telemetry    SR, no further dropped beats in ~12hours (personally reviewed)  Radiology    ECHOCARDIOGRAM LIMITED  Result Date: 10/19/2021    ECHOCARDIOGRAM LIMITED REPORT   Patient Name:   Connie Swanson Date of Exam: 10/19/2021 Medical Rec #:  [id number]   Height:       62.0 in Accession #:    [id number]  Weight:       197.9 lb Date of Birth:  01-07-1941   BSA:          1.903 m Patient Age:    81 years    BP:  157/67 mmHg Patient Gender: F           HR:           84 bpm. Exam Location:  Inpatient Procedure: Cardiac Doppler, Color Doppler and Limited Echo Indications:     I34.8 Other nonrheumatic mitral valve disorders.  History:         Patient has prior history of Echocardiogram examinations, most                  recent 10/18/2021. Abnormal ECG, Arrythmias:Bradycardia and                  RBBB; Risk Factors:Diabetes and Dyslipidemia.  Sonographer:     Roseanna Rainbow RDCS Referring Phys:  [id number] Rex Kras Diagnosing Phys: Rex Kras DO  Sonographer Comments: Technically difficult study due to poor echo windows and patient is obese. Image acquisition challenging due to patient body habitus. Limited echo to rule out MV and LVOT gradient and measure LV thickness. IMPRESSIONS  1. LV gradient at rest approximately 8.5 mmHg and with Valsalva 13 mmHg.Marland Kitchen Left ventricular ejection fraction, by estimation, is 60 to 65%. The left ventricle has normal function. The  left ventricle has no regional wall motion abnormalities. moderate to severe left ventricular hypertrophy. Diastolic function could not be evaluated due to mitral annular calcification underlying rhythm.  2. Right ventricular systolic function is normal. The right ventricular size is normal. There is mildly elevated pulmonary artery systolic pressure. The estimated right ventricular systolic pressure is 34.1 mmHg.  3. Left atrial size was mildly dilated.  4. A small pericardial effusion is present. The pericardial effusion is posterior to the left ventricle. There is no evidence of cardiac tamponade.  5. The mitral valve is degenerative. No evidence of mitral valve regurgitation. Degree of mitral stenosis is present, difficult to quantify given the underlying rhythm, fusion of E and A, and HR variability mitral stenosis. Moderate to severe mitral annular calcification.  6. The aortic valve is tricuspid. Aortic valve regurgitation is not visualized. Aortic valve sclerosis is present, with no evidence of aortic valve stenosis.  7. The inferior vena cava is normal in size with greater than 50% respiratory variability, suggesting right atrial pressure of 3 mmHg. FINDINGS  Left Ventricle: LV gradient at rest approximately 8.5 mmHg and with Valsalva 13 mmHg. Left ventricular ejection fraction, by estimation, is 60 to 65%. The left ventricle has normal function. The left ventricle has no regional wall motion abnormalities. Moderate to severe left ventricular hypertrophy. Diastolic function could not be evaluated due to mitral annular calcification underlying rhythm. Right Ventricle: The right ventricular size is normal. No increase in right ventricular wall thickness. Right ventricular systolic function is normal. There is mildly elevated pulmonary artery systolic pressure. The tricuspid regurgitant velocity is 3.21  m/s, and with an assumed right atrial pressure of 3 mmHg, the estimated right ventricular systolic pressure  is 96.2 mmHg. Left Atrium: Left atrial size was mildly dilated. Right Atrium: Right atrial size was normal in size. Pericardium: A small pericardial effusion is present. The pericardial effusion is posterior to the left ventricle. There is no evidence of cardiac tamponade. Mitral Valve: The mitral valve is degenerative in appearance. There is moderate calcification of the posterior mitral valve leaflet(s). Mildly decreased mobility of the mitral valve leaflets. Moderate to severe mitral annular calcification. Degree of mitral stenosis is present, difficult to quantify given the underlying rhythm, fusion of E and A, and HR variability mitral valve stenosis. MV peak gradient,  19.1 mmHg. The mean mitral valve gradient is 9.0 mmHg. Tricuspid Valve: The tricuspid valve is grossly normal. Tricuspid valve regurgitation is mild . No evidence of tricuspid stenosis. Aortic Valve: The aortic valve is tricuspid. Aortic valve regurgitation is not visualized. Aortic valve sclerosis is present, with no evidence of aortic valve stenosis. Aortic valve mean gradient measures 8.0 mmHg. Aortic valve peak gradient measures 14.4 mmHg. Aortic valve area, by VTI measures 2.50 cm. Pulmonic Valve: The pulmonic valve was grossly normal. Pulmonic valve regurgitation is trivial. No evidence of pulmonic stenosis. Aorta: The aortic root is normal in size and structure. Venous: The inferior vena cava is normal in size with greater than 50% respiratory variability, suggesting right atrial pressure of 3 mmHg. LEFT VENTRICLE PLAX 2D LVIDd:         4.60 cm LVIDs:         3.30 cm LV PW:         1.60 cm LV IVS:        1.70 cm LVOT diam:     2.10 cm LV SV:         100 LV SV Index:   53 LVOT Area:     3.46 cm  LV Volumes (MOD) LV vol d, MOD A2C: 99.7 ml LV vol d, MOD A4C: 121.0 ml LV vol s, MOD A2C: 35.6 ml LV vol s, MOD A4C: 55.1 ml LV SV MOD A2C:     64.1 ml LV SV MOD A4C:     121.0 ml LV SV MOD BP:      67.2 ml IVC IVC diam: 1.80 cm LEFT ATRIUM          Index LA diam:    4.00 cm 2.10 cm/m  AORTIC VALVE AV Area (Vmax):    2.81 cm AV Area (Vmean):   2.64 cm AV Area (VTI):     2.50 cm AV Vmax:           190.00 cm/s AV Vmean:          134.000 cm/s AV VTI:            0.401 m AV Peak Grad:      14.4 mmHg AV Mean Grad:      8.0 mmHg LVOT Vmax:         154.00 cm/s LVOT Vmean:        102.000 cm/s LVOT VTI:          0.290 m LVOT/AV VTI ratio: 0.72  AORTA Ao Root diam: 3.50 cm MITRAL VALVE                TRICUSPID VALVE MV Area (PHT): 5.38 cm     TR Peak grad:   41.2 mmHg MV Area VTI:   2.30 cm     TR Vmax:        321.00 cm/s MV Peak grad:  19.1 mmHg MV Mean grad:  9.0 mmHg     SHUNTS MV Vmax:       2.18 m/s     Systemic VTI:  0.29 m MV Vmean:      138.5 cm/s   Systemic Diam: 2.10 cm MV Decel Time: 141 msec MV E velocity: 133.00 cm/s MV A velocity: 180.00 cm/s MV E/A ratio:  0.74 Sunit Tolia DO Electronically signed by Rex Kras DO Signature Date/Time: 10/19/2021/6:41:43 PM    Final    ECHOCARDIOGRAM COMPLETE  Result Date: 10/19/2021    ECHOCARDIOGRAM REPORT   Patient Name:   Connie Swanson  [place] Date of Exam: 10/18/2021 Medical Rec #:  [id number]   Height:       62.0 in Accession #:    [id number]  Weight:       200.0 lb Date of Birth:  October 05, 1940   BSA:          1.912 m Patient Age:    29 years    BP:           183/49 mmHg Patient Gender: F           HR:           46 bpm. Exam Location:  Inpatient Procedure: 2D Echo, Cardiac Doppler and Color Doppler Indications:    Heart block, Complete I44.2  History:        Patient has prior history of Echocardiogram examinations, most                 recent 05/07/2020. CHF, CAD, Arrythmias:RBBB; Risk                 Factors:Diabetes, Dyslipidemia and Hypertension.  Sonographer:    Ronny Flurry Sonographer#2:  Raquel Sarna Senior Referring Phys: Rex Kras DO IMPRESSIONS  1. Left ventricular ejection fraction, by estimation, is 60 to 65%. The left ventricle has normal function. The left ventricle has no regional wall motion  abnormalities. Left ventricular diastolic function could not be evaluated due to MAC and heart block.  2. Right ventricular systolic function is normal. The right ventricular size is normal.  3. Left atrial size was mildly dilated.  4. Cannot comment on mitral stenosis due to underlying rhythm and moderate-severe MAC posteriorly. The mitral valve is degenerative. No evidence of mitral valve regurgitation. Moderate mitral annular calcification.  5. The aortic valve is tricuspid. Aortic valve regurgitation is not visualized. Aortic valve sclerosis is present, with no evidence of aortic valve stenosis.  6. The inferior vena cava is dilated in size with >50% respiratory variability, suggesting right atrial pressure of 8 mmHg.  7. Rhythm strip during this exam demonstrates Sinus with variable heart block. Conclusion(s)/Recommendation(s): Recommend limited echo to re-evaluate left ventricular chamber quantification (images are missing), evaluate for hyperdynamic LV gradients, and re-evaluate for mitral stenosis. FINDINGS  Left Ventricle: Left ventricular ejection fraction, by estimation, is 60 to 65%. The left ventricle has normal function. The left ventricle has no regional wall motion abnormalities. The left ventricular internal cavity size was normal in size. Left ventricular diastolic function could not be evaluated due to MAC and heart block. Right Ventricle: The right ventricular size is normal. No increase in right ventricular wall thickness. Right ventricular systolic function is normal. Left Atrium: Left atrial size was mildly dilated. Right Atrium: Right atrial size was normal in size. Pericardium: There is no evidence of pericardial effusion. Mitral Valve: Cannot comment on mitral stenosis due to underlying rhythm and moderate-severe MAC posteriorly. The mitral valve is degenerative in appearance. Moderate mitral annular calcification. No evidence of mitral valve regurgitation. MV peak gradient, 14.4 mmHg.  Tricuspid Valve: The tricuspid valve is grossly normal. Tricuspid valve regurgitation is mild . No evidence of tricuspid stenosis. Aortic Valve: The aortic valve is tricuspid. Aortic valve regurgitation is not visualized. Aortic valve sclerosis is present, with no evidence of aortic valve stenosis. Aortic valve mean gradient measures 7.5 mmHg. Aortic valve peak gradient measures 13.0 mmHg. Aortic valve area, by VTI measures 2.35 cm. Pulmonic Valve: The pulmonic valve was grossly normal. Pulmonic valve regurgitation is trivial. No evidence of pulmonic stenosis. Aorta: The  aortic root is normal in size and structure. Venous: The inferior vena cava is dilated in size with greater than 50% respiratory variability, suggesting right atrial pressure of 8 mmHg. IAS/Shunts: The atrial septum is grossly normal. EKG: Rhythm strip during this exam demonstrates Sinus with variable heart block.  LEFT VENTRICLE PLAX 2D LVOT diam:     2.00 cm   Diastology LV SV:         104       LV e' lateral:   9.81 cm/s LV SV Index:   54        LV E/e' lateral: 18.7 LVOT Area:     3.14 cm  RIGHT VENTRICLE             IVC RV S prime:     11.00 cm/s  IVC diam: 2.20 cm TAPSE (M-mode): 1.7 cm LEFT ATRIUM             Index        RIGHT ATRIUM           Index LA Vol (A2C):   87.8 ml 45.93 ml/m  RA Area:     19.10 cm LA Vol (A4C):   71.9 ml 37.61 ml/m  RA Volume:   49.80 ml  26.05 ml/m LA Biplane Vol: 84.2 ml 44.04 ml/m  AORTIC VALVE AV Area (Vmax):    2.18 cm AV Area (Vmean):   2.16 cm AV Area (VTI):     2.35 cm AV Vmax:           180.00 cm/s AV Vmean:          126.500 cm/s AV VTI:            0.443 m AV Peak Grad:      13.0 mmHg AV Mean Grad:      7.5 mmHg LVOT Vmax:         124.80 cm/s LVOT Vmean:        86.900 cm/s LVOT VTI:          0.331 m LVOT/AV VTI ratio: 0.75  AORTA Ao Root diam: 3.10 cm MITRAL VALVE                TRICUSPID VALVE MV Area (PHT): 3.89 cm     TR Peak grad:   34.1 mmHg MV Area VTI:   1.19 cm     TR Vmax:        292.00  cm/s MV Peak grad:  14.4 mmHg MV Vmax:       1.90 m/s     SHUNTS MV Vmean:      66.1 cm/s    Systemic VTI:  0.33 m MV Decel Time: 195 msec     Systemic Diam: 2.00 cm MV E velocity: 183.00 cm/s MV A velocity: 133.00 cm/s MV E/A ratio:  1.38 Sunit Tolia DO Electronically signed by Rex Kras DO Signature Date/Time: 10/19/2021/6:19:55 PM    Final     Patient Profile   Connie Swanson is a 81 y.o. female with a hx of HFpEF, DM, HTN, hypothyroidism, obesity, vertigo, CAD (single vessel non-obstructive in review of Dr. Brennan Bailey note), CKD (IV),  who is being seen for the evaluation of CHB at the request of Dr. Terri Skains.   Assessment & Plan    CHB with RBBB escape  Syncope LBBB at baseline HFpEF ?LVH AKI on CKD Leukocytosis Anemia   Patient has been off coreg for 1 full day SR on tele overnight without a missed  beat in ~12hours No plans for PM at this time Discharge with Zio AT for close monitor Outpatient EP follow-up will be scheduled       Morley will sign off.   Medication Recommendations:  discontinue BB at dc Other recommendations (labs, testing, etc):  zio AT at discharge Follow up as an outpatient:  yes, will be scheduled  For questions or updates, please contact [facility] Please consult [email or web address] for contact info under Cardiology/STEMI.  Signed, Mamie Levers, NP  10/20/2021, 9:01 AM  (As above)  With no further pauses, would discharge with ongoing real time monitoring with  AT-Zio--discussed with dr Terri Skains

## 2021-10-20 NOTE — ED Triage Notes (Signed)
The pt arrived  by gems from home  she was discharged from this hospital 1400 today she passed out at home  she apparently has  passing out spells.  The family at the pts side reports some jerking of her hands which is new  alert and oriented at present iv per ems  no meds given  the pt had nausea  but vomited and now she feels better

## 2021-10-20 NOTE — Progress Notes (Signed)
Mobility Specialist - Progress Note   10/20/21 1251  Mobility  Activity Ambulated with assistance in hallway  Level of Assistance Standby assist, set-up cues, supervision of patient - no hands on  Assistive Device Front wheel walker  Distance Ambulated (ft) 300 ft  Activity Response Tolerated well  Mobility Referral Yes  $Mobility charge 1 Mobility   Pt received in bed and agreeable. No complaints throughout. Pt returned to bed with all needs met.  Larey Seat

## 2021-10-20 NOTE — Progress Notes (Signed)
CCMD notified of dc order. Belongings and dc papers given to patient.Pt alert and oriented ,both patient and grandaughter verbalized understanding of instructions.

## 2021-10-20 NOTE — ED Notes (Signed)
Pt's family stated that the wait too long and they were going to take her home. Pt seen leaving with family.

## 2021-10-20 NOTE — Care Management Important Message (Signed)
Important Message  Patient Details  Name: Connie Swanson MRN: [id number] Date of Birth: August 07, 1940   Medicare Important Message Given:  Yes     Shelda Altes 10/20/2021, 10:29 AM

## 2021-10-20 NOTE — Discharge Summary (Addendum)
Physician Discharge Summary  Patient ID: DYNASIA KERCHEVAL MRN: [id number] DOB/AGE: November 25, 1940 81 y.o.  Admit date: 10/17/2021 Discharge date: 10/20/2021  Primary Discharge Diagnosis: Symptomatic bradycardia-resolved. Complete heart block with ventricular escape-resolved  Secondary Discharge Diagnosis: Chronic HFpEF Left ventricular hypertrophy Hypertension with HFpEF, and chronic kidney disease stage IV Insulin-dependent diabetes mellitus type 2 Single-vessel CAD Hyperlipidemia Anemia  Hospital Course:   81 y.o. Caucasian female  with  whose past medical history and cardiac risk factors include: HFpEF, single-vessel CAD (nonobstructive), insulin-dependent diabetes mellitus type 2, chronic kidney disease, hypertension, history of ischemic colitis, hypothyroidism, obesity due to excess calories, vertigo, history of COVID-19 infection (January 2022), postmenopausal female, advanced age.  Presented for a regular office visit and 10/17/2021.  She is complaining of feeling tired, fatigued, lightheaded, dizziness prior to arrival and EKG illustrated underlying rhythm to be sinus with ventricular escape rhythm.  Due to symptomatic bradycardia she was referred to the ED for further evaluation and management.  Patient was evaluated by cardiac electrophysiologist Dr. Caryl Comes during his hospitalization who recommended we will wait for watching as her ventricular rate was improving.  Her underlying conduction improved and she is now sinus rhythm and her underlying heart rate is around 90 bpm.  They recommend not restarting any AV nodal blocking agents.  Zio patch has been placed for monitoring and they will follow-up outpatient.  Clinically she denies feeling tired, fatigued, lightheaded dizziness, near-syncope or syncope.  However she is noticing tightness in her fingers concerning for fluid accumulation.  Given her symptomatic bradycardia and renal function Entresto and Jardiance were held since Monday,  10/17/2021.  Patient is accompanied by her son and granddaughter at bedside who both provide collateral history during today's encounter.   Discharge Exam: Physical Exam  Constitutional: No distress.  Age appropriate, hemodynamically stable.   Neck: No JVD present.  Cardiovascular: Normal rate, regular rhythm, S1 normal, S2 normal, intact distal pulses and normal pulses. Exam reveals no gallop, no S3 and no S4.  No murmur heard. Zio patch present.  Pulmonary/Chest: Effort normal. No stridor. She has no wheezes. She has bilateral rales to the midchest.  Abdominal: Soft. Bowel sounds are normal. She exhibits no distension. There is no abdominal tenderness.  Musculoskeletal:        General: No edema.     Cervical back: Neck supple.  Neurological: She is alert and oriented to person, place, and time. She has intact cranial nerves (2-12).  Skin: Skin is warm and moist.   Recommendations on discharge:   #1 cleared by electrophysiology for discharge.  No pacemaker recommended at this time.  Zio patch placed for monitoring of conduction disease.  Patient will follow-up with EP as outpatient going forward.  As she may need pacemaker at some point.  #2 prior to arrival she was on carvedilol for HFpEF as well as blood pressure management.  Given the conduction disease as outlined above we will hold off AV nodal blocking agents for now.  #3 noted to have acute kidney injury on chronic kidney disease on arrival and therefore Entresto and Jardiance were both held.  Renal function improving.  But symptomatically patient notes weight gain and tightness in her fingers.  #4 continue hydralazine and Imdur combination as outpatient for HFpEF and blood pressure management.  We will restart Bumex 0.5 mg p.o. daily.  #5 we will monitor her via remote patient monitoring with regards to blood pressure.  She is asked to monitor her weight regularly.  We will target a  weight of 204 pounds (When her weight is  around 200# she developes AKI and when she is around 208 pounds she is symptomatic (shortness of breath)).   #6 outpatient follow-up arranged.  Labs prior to arrival.  #7 plan of care discussed with patient, son, and granddaughter at bedside  CARDIAC DATABASE: EKG: 10/17/2021: Sinus bradycardia with complete heart block, 38 bpm, left axis, left anterior fascicular block, right bundle branch block, cannot rule out anterolateral ischemia.   10/18/2021: Sinus rhythm 65 bpm 2:1 conduction disease, left bundle branch block, QRS 140 ms  10/20/2021: Normal sinus rhythm, 85 bpm, left axis, left bundle branch block   Echocardiogram: 10/18/2021: LVEF 60-65%, no regional wall motion abnormalities, mildly dilated left atrium, moderate to severe MAC, estimated RAP 8 mmHg.  Underlying rhythm illustrates sinus with variable heart block.  Recommendation limited echo to reevaluate left ventricular chamber quantification (as images are missing), evaluate for hyperdynamic LV gradient, and reevaluate for mitral stenosis.   10/19/2021 (limited echo): Moderate to severe LVH. LV gradient at rest 8.5 mmHg with Valsalva 30 mmHg.  LVEF 60 to 65%. Diastolic dysfunction not assessed due to MAC underlying rhythm. Small pericardial effusion without tamponade. Moderate to severe MAC. Degree of mitral stenosis is present, difficult to quantify the severity given the underlying rhythm, fusion of E and A waves, and heart rate variability.    Stress Testing: Lexiscan Tetrofosmin stress test 05/11/2020: Lexiscan nuclear stress test performed using 1-day protocol. SPECT images show uniform breast tissue attenuation in both rest and stress images. In addition, there is medium sized, medium intensity, fixed perfusion defect in basal inferior/inferoseptal myocardium. All segments of left ventricle demonstrated septal dyskinesis, global hypokinesis. Stress LVEF 34%. High risk study due to low stress LVEF.   Heart  Catheterization: 10/10/2010: HEMODYNAMIC DATA:  Right heart catheterization:  RA pressure 7/7, mean 6 mmHg.  RA saturation 76%.   RV pressure 31/4, end-diastolic pressure 4 mmHg.   PA pressure 27/40 with a mean of 21 mmHg.  PA saturation was 75%.   Pulmonary capillary wedge 6/5 with a mean of 3 mmHg.  Aortic saturation was 100%.   Cardiac output was 6.4 with a cardiac index of 3.34 by Fick.   Right coronary artery:  Right coronary artery is a large caliber and superdominant vessel.  Gives origin to large PL branch and 2 PDA large branches.  Smooth and normal.   Left main coronary artery:  Left main coronary artery is a large-caliber vessel, which is smooth and normal.   Circumflex coronary artery:  Circumflex coronary artery is a very large- caliber vessel.  Gives origin to a moderate-sized obtuse marginal 1 and continues distally as a large obtuse marginal 2.  Smooth and normal.   LAD:  LAD is a large-caliber vessel in the proximal segment.  Gives origin to a moderate-sized diagonal 1 and then gives origin to a very large diagonal 2, which is bigger than the LAD itself.  Between diagonal 1 and diagonal 2, there is a eccentric 50% stenosis noted in the LAD. The diagonal 2, which is very large has a ostial 30% stenosis. Otherwise, except for this focal stenosis of 50%, there is no other lesion noted in the LAD or the large diagonal 2.  Labs:   Lab Results  Component Value Date   WBC 13.1 (H) 10/19/2021   HGB 10.2 (L) 10/19/2021   HCT 30.3 (L) 10/19/2021   MCV 101.0 (H) 10/19/2021   PLT 258 10/19/2021    Recent Labs  Lab 10/17/21 1540 10/17/21 1921 10/20/21 0330  NA 139   < > 140  K 4.2   < > 4.6  CL 107   < > 106  CO2 22   < > 27  BUN 29*   < > 30*  CREATININE 2.03*   < > 1.88*  CALCIUM 9.2   < > 9.7  PROT 6.4*  --   --   BILITOT 0.4  --   --   ALKPHOS 56  --   --   ALT 9  --   --   AST 13*  --   --   GLUCOSE 196*   < > 137*   < > = values in this interval  not displayed.    Lipid Panel  No results found for: "CHOL", "TRIG", "HDL", "CHOLHDL", "VLDL", "LDLCALC"  BNP (last 3 results) Recent Labs    10/19/21 0040  BNP 688.8*    HEMOGLOBIN A1C No results found for: "HGBA1C", "MPG"  Cardiac Panel (last 3 results) No results for input(s): "CKTOTAL", "CKMB", "TROPONINI", "RELINDX" in the last 8760 hours.  No results found for: "CKTOTAL", "CKMB", "CKMBINDEX", "TROPONINI"   TSH Recent Labs    10/17/21 1540  TSH 2.542    Radiology: ECHOCARDIOGRAM LIMITED  Result Date: 10/19/2021    ECHOCARDIOGRAM LIMITED REPORT   Patient Name:   MISCHA BRITTINGHAM Date of Exam: 10/19/2021 Medical Rec #:  [id number]   Height:       62.0 in Accession #:    [id number]  Weight:       197.9 lb Date of Birth:  09-17-1940   BSA:          1.903 m Patient Age:    57 years    BP:           157/67 mmHg Patient Gender: F           HR:           84 bpm. Exam Location:  Inpatient Procedure: Cardiac Doppler, Color Doppler and Limited Echo Indications:     I34.8 Other nonrheumatic mitral valve disorders.  History:         Patient has prior history of Echocardiogram examinations, most                  recent 10/18/2021. Abnormal ECG, Arrythmias:Bradycardia and                  RBBB; Risk Factors:Diabetes and Dyslipidemia.  Sonographer:     Roseanna Rainbow RDCS Referring Phys:  [id number] Rex Kras Diagnosing Phys: Rex Kras DO  Sonographer Comments: Technically difficult study due to poor echo windows and patient is obese. Image acquisition challenging due to patient body habitus. Limited echo to rule out MV and LVOT gradient and measure LV thickness. IMPRESSIONS  1. LV gradient at rest approximately 8.5 mmHg and with Valsalva 13 mmHg.Marland Kitchen Left ventricular ejection fraction, by estimation, is 60 to 65%. The left ventricle has normal function. The left ventricle has no regional wall motion abnormalities. moderate to severe left ventricular hypertrophy. Diastolic function could not be evaluated due  to mitral annular calcification underlying rhythm.  2. Right ventricular systolic function is normal. The right ventricular size is normal. There is mildly elevated pulmonary artery systolic pressure. The estimated right ventricular systolic pressure is 62.9 mmHg.  3. Left atrial size was mildly dilated.  4. A small pericardial effusion is present. The pericardial effusion is posterior to the left ventricle. There is  no evidence of cardiac tamponade.  5. The mitral valve is degenerative. No evidence of mitral valve regurgitation. Degree of mitral stenosis is present, difficult to quantify given the underlying rhythm, fusion of E and A, and HR variability mitral stenosis. Moderate to severe mitral annular calcification.  6. The aortic valve is tricuspid. Aortic valve regurgitation is not visualized. Aortic valve sclerosis is present, with no evidence of aortic valve stenosis.  7. The inferior vena cava is normal in size with greater than 50% respiratory variability, suggesting right atrial pressure of 3 mmHg. FINDINGS  Left Ventricle: LV gradient at rest approximately 8.5 mmHg and with Valsalva 13 mmHg. Left ventricular ejection fraction, by estimation, is 60 to 65%. The left ventricle has normal function. The left ventricle has no regional wall motion abnormalities. Moderate to severe left ventricular hypertrophy. Diastolic function could not be evaluated due to mitral annular calcification underlying rhythm. Right Ventricle: The right ventricular size is normal. No increase in right ventricular wall thickness. Right ventricular systolic function is normal. There is mildly elevated pulmonary artery systolic pressure. The tricuspid regurgitant velocity is 3.21  m/s, and with an assumed right atrial pressure of 3 mmHg, the estimated right ventricular systolic pressure is 97.3 mmHg. Left Atrium: Left atrial size was mildly dilated. Right Atrium: Right atrial size was normal in size. Pericardium: A small pericardial  effusion is present. The pericardial effusion is posterior to the left ventricle. There is no evidence of cardiac tamponade. Mitral Valve: The mitral valve is degenerative in appearance. There is moderate calcification of the posterior mitral valve leaflet(s). Mildly decreased mobility of the mitral valve leaflets. Moderate to severe mitral annular calcification. Degree of mitral stenosis is present, difficult to quantify given the underlying rhythm, fusion of E and A, and HR variability mitral valve stenosis. MV peak gradient, 19.1 mmHg. The mean mitral valve gradient is 9.0 mmHg. Tricuspid Valve: The tricuspid valve is grossly normal. Tricuspid valve regurgitation is mild . No evidence of tricuspid stenosis. Aortic Valve: The aortic valve is tricuspid. Aortic valve regurgitation is not visualized. Aortic valve sclerosis is present, with no evidence of aortic valve stenosis. Aortic valve mean gradient measures 8.0 mmHg. Aortic valve peak gradient measures 14.4 mmHg. Aortic valve area, by VTI measures 2.50 cm. Pulmonic Valve: The pulmonic valve was grossly normal. Pulmonic valve regurgitation is trivial. No evidence of pulmonic stenosis. Aorta: The aortic root is normal in size and structure. Venous: The inferior vena cava is normal in size with greater than 50% respiratory variability, suggesting right atrial pressure of 3 mmHg. LEFT VENTRICLE PLAX 2D LVIDd:         4.60 cm LVIDs:         3.30 cm LV PW:         1.60 cm LV IVS:        1.70 cm LVOT diam:     2.10 cm LV SV:         100 LV SV Index:   53 LVOT Area:     3.46 cm  LV Volumes (MOD) LV vol d, MOD A2C: 99.7 ml LV vol d, MOD A4C: 121.0 ml LV vol s, MOD A2C: 35.6 ml LV vol s, MOD A4C: 55.1 ml LV SV MOD A2C:     64.1 ml LV SV MOD A4C:     121.0 ml LV SV MOD BP:      67.2 ml IVC IVC diam: 1.80 cm LEFT ATRIUM         Index LA  diam:    4.00 cm 2.10 cm/m  AORTIC VALVE AV Area (Vmax):    2.81 cm AV Area (Vmean):   2.64 cm AV Area (VTI):     2.50 cm AV Vmax:            190.00 cm/s AV Vmean:          134.000 cm/s AV VTI:            0.401 m AV Peak Grad:      14.4 mmHg AV Mean Grad:      8.0 mmHg LVOT Vmax:         154.00 cm/s LVOT Vmean:        102.000 cm/s LVOT VTI:          0.290 m LVOT/AV VTI ratio: 0.72  AORTA Ao Root diam: 3.50 cm MITRAL VALVE                TRICUSPID VALVE MV Area (PHT): 5.38 cm     TR Peak grad:   41.2 mmHg MV Area VTI:   2.30 cm     TR Vmax:        321.00 cm/s MV Peak grad:  19.1 mmHg MV Mean grad:  9.0 mmHg     SHUNTS MV Vmax:       2.18 m/s     Systemic VTI:  0.29 m MV Vmean:      138.5 cm/s   Systemic Diam: 2.10 cm MV Decel Time: 141 msec MV E velocity: 133.00 cm/s MV A velocity: 180.00 cm/s MV E/A ratio:  0.74 Michale Weikel DO Electronically signed by Rex Kras DO Signature Date/Time: 10/19/2021/6:41:43 PM    Final    ECHOCARDIOGRAM COMPLETE  Result Date: 10/19/2021    ECHOCARDIOGRAM REPORT   Patient Name:   POETRY CERRO Date of Exam: 10/18/2021 Medical Rec #:  [id number]   Height:       62.0 in Accession #:    [id number]  Weight:       200.0 lb Date of Birth:  01-12-1940   BSA:          1.912 m Patient Age:    29 years    BP:           183/49 mmHg Patient Gender: F           HR:           46 bpm. Exam Location:  Inpatient Procedure: 2D Echo, Cardiac Doppler and Color Doppler Indications:    Heart block, Complete I44.2  History:        Patient has prior history of Echocardiogram examinations, most                 recent 05/07/2020. CHF, CAD, Arrythmias:RBBB; Risk                 Factors:Diabetes, Dyslipidemia and Hypertension.  Sonographer:    Ronny Flurry Sonographer#2:  Raquel Sarna Senior Referring Phys: Rex Kras DO IMPRESSIONS  1. Left ventricular ejection fraction, by estimation, is 60 to 65%. The left ventricle has normal function. The left ventricle has no regional wall motion abnormalities. Left ventricular diastolic function could not be evaluated due to MAC and heart block.  2. Right ventricular systolic function is normal. The  right ventricular size is normal.  3. Left atrial size was mildly dilated.  4. Cannot comment on mitral stenosis due to underlying rhythm and moderate-severe MAC posteriorly. The mitral valve is degenerative. No evidence of  mitral valve regurgitation. Moderate mitral annular calcification.  5. The aortic valve is tricuspid. Aortic valve regurgitation is not visualized. Aortic valve sclerosis is present, with no evidence of aortic valve stenosis.  6. The inferior vena cava is dilated in size with >50% respiratory variability, suggesting right atrial pressure of 8 mmHg.  7. Rhythm strip during this exam demonstrates Sinus with variable heart block. Conclusion(s)/Recommendation(s): Recommend limited echo to re-evaluate left ventricular chamber quantification (images are missing), evaluate for hyperdynamic LV gradients, and re-evaluate for mitral stenosis. FINDINGS  Left Ventricle: Left ventricular ejection fraction, by estimation, is 60 to 65%. The left ventricle has normal function. The left ventricle has no regional wall motion abnormalities. The left ventricular internal cavity size was normal in size. Left ventricular diastolic function could not be evaluated due to MAC and heart block. Right Ventricle: The right ventricular size is normal. No increase in right ventricular wall thickness. Right ventricular systolic function is normal. Left Atrium: Left atrial size was mildly dilated. Right Atrium: Right atrial size was normal in size. Pericardium: There is no evidence of pericardial effusion. Mitral Valve: Cannot comment on mitral stenosis due to underlying rhythm and moderate-severe MAC posteriorly. The mitral valve is degenerative in appearance. Moderate mitral annular calcification. No evidence of mitral valve regurgitation. MV peak gradient, 14.4 mmHg. Tricuspid Valve: The tricuspid valve is grossly normal. Tricuspid valve regurgitation is mild . No evidence of tricuspid stenosis. Aortic Valve: The aortic valve  is tricuspid. Aortic valve regurgitation is not visualized. Aortic valve sclerosis is present, with no evidence of aortic valve stenosis. Aortic valve mean gradient measures 7.5 mmHg. Aortic valve peak gradient measures 13.0 mmHg. Aortic valve area, by VTI measures 2.35 cm. Pulmonic Valve: The pulmonic valve was grossly normal. Pulmonic valve regurgitation is trivial. No evidence of pulmonic stenosis. Aorta: The aortic root is normal in size and structure. Venous: The inferior vena cava is dilated in size with greater than 50% respiratory variability, suggesting right atrial pressure of 8 mmHg. IAS/Shunts: The atrial septum is grossly normal. EKG: Rhythm strip during this exam demonstrates Sinus with variable heart block.  LEFT VENTRICLE PLAX 2D LVOT diam:     2.00 cm   Diastology LV SV:         104       LV e' lateral:   9.81 cm/s LV SV Index:   54        LV E/e' lateral: 18.7 LVOT Area:     3.14 cm  RIGHT VENTRICLE             IVC RV S prime:     11.00 cm/s  IVC diam: 2.20 cm TAPSE (M-mode): 1.7 cm LEFT ATRIUM             Index        RIGHT ATRIUM           Index LA Vol (A2C):   87.8 ml 45.93 ml/m  RA Area:     19.10 cm LA Vol (A4C):   71.9 ml 37.61 ml/m  RA Volume:   49.80 ml  26.05 ml/m LA Biplane Vol: 84.2 ml 44.04 ml/m  AORTIC VALVE AV Area (Vmax):    2.18 cm AV Area (Vmean):   2.16 cm AV Area (VTI):     2.35 cm AV Vmax:           180.00 cm/s AV Vmean:          126.500 cm/s AV VTI:  0.443 m AV Peak Grad:      13.0 mmHg AV Mean Grad:      7.5 mmHg LVOT Vmax:         124.80 cm/s LVOT Vmean:        86.900 cm/s LVOT VTI:          0.331 m LVOT/AV VTI ratio: 0.75  AORTA Ao Root diam: 3.10 cm MITRAL VALVE                TRICUSPID VALVE MV Area (PHT): 3.89 cm     TR Peak grad:   34.1 mmHg MV Area VTI:   1.19 cm     TR Vmax:        292.00 cm/s MV Peak grad:  14.4 mmHg MV Vmax:       1.90 m/s     SHUNTS MV Vmean:      66.1 cm/s    Systemic VTI:  0.33 m MV Decel Time: 195 msec     Systemic Diam:  2.00 cm MV E velocity: 183.00 cm/s MV A velocity: 133.00 cm/s MV E/A ratio:  1.38 Divante Kotch DO Electronically signed by Rex Kras DO Signature Date/Time: 10/19/2021/6:19:55 PM    Final    DG Chest Port 1 View  Result Date: 10/17/2021 CLINICAL DATA:  Bradycardia, history CHF, hypertension, coronary artery disease, diabetes mellitus, chronic kidney disease EXAM: PORTABLE CHEST 1 VIEW COMPARISON:  Portable exam 1602 hours compared to 04/28/2020 FINDINGS: External pacing leads project over chest. Enlargement of cardiac silhouette. Mediastinal contours and pulmonary vascularity normal. Atherosclerotic calcification aorta. Chronic accentuation of RIGHT upper lobe markings with volume loss question scarring. Central peribronchial thickening. Additional mild prominence of interstitial markings mid to lower lungs unchanged. No definite acute infiltrate, pleural effusion or pneumothorax. Bones demineralized. IMPRESSION: Enlargement of cardiac silhouette. Bronchitic and chronic interstitial changes with RIGHT upper lobe scarring. No acute abnormalities. Aortic Atherosclerosis (ICD10-I70.0). Electronically Signed   By: Lavonia Dana M.D.   On: 10/17/2021 16:17    FOLLOW UP PLANS AND APPOINTMENTS  Allergies as of 10/20/2021       Reactions   Dapagliflozin Nausea And Vomiting        Medication List     STOP taking these medications    carvedilol 25 MG tablet Commonly known as: COREG   Entresto 49-51 MG Generic drug: sacubitril-valsartan   Jardiance 10 MG Tabs tablet Generic drug: empagliflozin       TAKE these medications    allopurinol 300 MG tablet Commonly known as: ZYLOPRIM Take 300 mg by mouth daily.   atorvastatin 40 MG tablet Commonly known as: LIPITOR Take 40 mg by mouth daily.   B-D INS SYR ULTRAFINE 1CC/30G 30G X 1/2" 1 ML Misc Generic drug: Insulin Syringe-Needle U-100 USE SYRINGE AS DIRECTED TWICE DAILY   bumetanide 0.5 MG tablet Commonly known as: Bumex Take 1  tablet (0.5 mg total) by mouth every morning. What changed:  when to take this reasons to take this   calcitRIOL 0.25 MCG capsule Commonly known as: ROCALTROL Take 0.25 mcg by mouth every other day. Mon, Wed, Fri   denosumab 60 MG/ML Sosy injection Commonly known as: PROLIA Inject 60 mg into the skin every 6 (six) months.   hydrALAZINE 50 MG tablet Commonly known as: APRESOLINE TAKE 1 TABLET BY MOUTH THREE TIMES DAILY   isosorbide mononitrate 30 MG 24 hr tablet Commonly known as: IMDUR Take 1/2 (one-half) tablet by mouth once daily What changed: See the new instructions.  Vitamin D3 1.25 MG (50000 UT) Tabs Take 1 tablet by mouth daily at 12 noon.       Discharge Time spent : 35 minutes.   Rex Kras, [place], [facility]  Pager: [phone] Office: [phone]

## 2021-10-22 ENCOUNTER — Telehealth: Payer: Self-pay | Admitting: Cardiology

## 2021-10-22 DIAGNOSIS — R55 Syncope and collapse: Secondary | ICD-10-CM | POA: Diagnosis not present

## 2021-10-22 DIAGNOSIS — I459 Conduction disorder, unspecified: Secondary | ICD-10-CM

## 2021-10-22 NOTE — Telephone Encounter (Signed)
ON-CALL CARDIOLOGY Encounter: 10/21/2021 Documentation: 10/22/2021.   Patient's name: Connie Swanson.   MRN: [id number].    DOB: Sep 20, 1940 Primary care provider: Haywood Pao, MD. Primary cardiologist: Rex Kras, DO, [facility]  Interaction regarding this patient's care today: Recently hospitalized for symptomatic bradycardia.  Discharged on 10/20/2021.  She returned back to the ED on 10/20/2021 due to a syncopal event as per her daughter Connie Swanson.  It appears that patient reached home and after some time had gone to the bathroom and on her way back she sat down abruptly because she was feeling lightheaded and dizzy.  After sitting down she was not at baseline.  Her daughter recalls her looking pale and passed out for 30 seconds.  They came back to the ED the same day on 10/20/2021.  Due to prolonged wait times they eventually left around 11:30 PM.  Following day on 10/21/2021 Connie Standard, PA from EP reached as she noticed the Connie Swanson was in ED s/p discharged.   Since patient was d/c on Zio patch. Connie Swanson was able to retreive the data.   The Zio patch data that Connie Swanson provided noted underlying rhythm to be sinus w/ frequent ectopy. Due to the clarity cannot rule out presence of conduction disease. Around the time she trigger the monitor she does have group beating. But no pauses / asystole/ Afib / on the tracing provided.   I spoke to Connie Swanson (Connie Swanson daughter) and Connie Swanson on 10/21/2021 no recurrences she feels well for now.    Connie Swanson was updated and will have the office reach out the patient / family for outpatient evaluation on more sooner basis.   Impression:   ICD-10-CM   1. Syncope, unspecified syncope type  R55     2. Conduction disorder of the heart  I45.9       Recommendations: Currently not on AV nodal blocking agents. Currently asymptomatic. Patient is asked to go to the closest ER via EMS if she has additional episodes near-syncope or syncope.  She verbalizes  understanding. Plan of care discussed with Connie Swanson as noted above  Telephone encounter total time: 20 minutes  Connie Swanson [facility]  Pager: [phone] Office: [phone]

## 2021-10-23 ENCOUNTER — Telehealth: Payer: Self-pay | Admitting: Nurse Practitioner

## 2021-10-23 ENCOUNTER — Telehealth: Payer: Self-pay

## 2021-10-23 NOTE — Telephone Encounter (Signed)
   Contacted by [employer] re: brief period of CHB w/ 6.4 sec pause occurring earlier this afternoon.  I contacted pt. She did have a brief episode of presyncope earlier today.  She has been off of carvedilol since recent hospitalization.  I advised that if she has any recurrent presyncope or syncope, she will need to call 911 and come into the ED tonight.  She is currently asymptomatic and prefers to avoid the ED.  I will contact our office to arrange for outpatient follow-up within the next 24-48 hrs.  Caller verbalized understanding and was grateful for the call back.  Murray Hodgkins, NP 10/23/2021, 6:36 PM

## 2021-10-23 NOTE — Telephone Encounter (Signed)
   Reason for call: ED-Follow up call   Patient  visit on 10/20/2021  at [facility] was for fainting  Have you been able to follow up with your primary care physician? - Not at this time. Patient advised she is waiting for the cardiologist to call her back. If no one reaches out, she will call 10/24/2021.  The patient was or was not able to obtain any needed medicine or equipment. - N/A  Are there diet recommendations that you are having difficulty following? - No  Patient expresses understanding of discharge instructions and education provided has no other needs at this time.    [facility]  [place], [place]  Main Phone: [phone]  E-mail: [email or web address]  Website: [email or web address]

## 2021-10-23 NOTE — Progress Notes (Signed)
External Labs: Collected: 10/13/2021 provided by referring physician. Magnesium 2. NT proBNP 2450  Collected: 10/06/2021 Sodium 143, potassium 4.7, chloride 104, bicarb 28. eGFR 23.8. AST 11, ALT 10, alkaline phosphatase 58. BUN 26, creatinine 2 mg/dL Hemoglobin 10.9, hematocrit 31.5% A1c 6. TSH 3.03. Total cholesterol 134, triglycerides 119, HDL 27, LDL 83, non-HDL 107  Biddie Sebek [place], DO, [facility]

## 2021-10-24 ENCOUNTER — Observation Stay (HOSPITAL_COMMUNITY)
Admission: EM | Admit: 2021-10-24 | Discharge: 2021-10-25 | Disposition: A | Discharge status: Home / Self Care | Payer: Medicare Other | Attending: Cardiology | Admitting: Cardiology

## 2021-10-24 ENCOUNTER — Emergency Department (HOSPITAL_COMMUNITY): Payer: Medicare Other

## 2021-10-24 ENCOUNTER — Encounter (HOSPITAL_COMMUNITY): Payer: Self-pay | Admitting: *Deleted

## 2021-10-24 ENCOUNTER — Encounter (HOSPITAL_COMMUNITY)
Admission: EM | Disposition: A | Discharge status: Home / Self Care | Payer: Self-pay | Source: Home / Self Care | Attending: Emergency Medicine

## 2021-10-24 ENCOUNTER — Other Ambulatory Visit: Payer: Self-pay

## 2021-10-24 DIAGNOSIS — E039 Hypothyroidism, unspecified: Secondary | ICD-10-CM | POA: Diagnosis not present

## 2021-10-24 DIAGNOSIS — I10 Essential (primary) hypertension: Secondary | ICD-10-CM | POA: Diagnosis not present

## 2021-10-24 DIAGNOSIS — M109 Gout, unspecified: Secondary | ICD-10-CM | POA: Diagnosis not present

## 2021-10-24 DIAGNOSIS — Z79899 Other long term (current) drug therapy: Secondary | ICD-10-CM | POA: Insufficient documentation

## 2021-10-24 DIAGNOSIS — I447 Left bundle-branch block, unspecified: Secondary | ICD-10-CM | POA: Diagnosis present

## 2021-10-24 DIAGNOSIS — R001 Bradycardia, unspecified: Secondary | ICD-10-CM | POA: Diagnosis not present

## 2021-10-24 DIAGNOSIS — I959 Hypotension, unspecified: Secondary | ICD-10-CM | POA: Diagnosis not present

## 2021-10-24 DIAGNOSIS — Z6836 Body mass index (BMI) 36.0-36.9, adult: Secondary | ICD-10-CM | POA: Insufficient documentation

## 2021-10-24 DIAGNOSIS — E1122 Type 2 diabetes mellitus with diabetic chronic kidney disease: Secondary | ICD-10-CM | POA: Insufficient documentation

## 2021-10-24 DIAGNOSIS — R55 Syncope and collapse: Secondary | ICD-10-CM | POA: Diagnosis not present

## 2021-10-24 DIAGNOSIS — Z794 Long term (current) use of insulin: Secondary | ICD-10-CM | POA: Insufficient documentation

## 2021-10-24 DIAGNOSIS — E785 Hyperlipidemia, unspecified: Secondary | ICD-10-CM | POA: Diagnosis not present

## 2021-10-24 DIAGNOSIS — R531 Weakness: Secondary | ICD-10-CM | POA: Diagnosis not present

## 2021-10-24 DIAGNOSIS — E669 Obesity, unspecified: Secondary | ICD-10-CM | POA: Diagnosis not present

## 2021-10-24 DIAGNOSIS — I130 Hypertensive heart and chronic kidney disease with heart failure and stage 1 through stage 4 chronic kidney disease, or unspecified chronic kidney disease: Secondary | ICD-10-CM | POA: Diagnosis present

## 2021-10-24 DIAGNOSIS — R002 Palpitations: Secondary | ICD-10-CM | POA: Diagnosis not present

## 2021-10-24 DIAGNOSIS — I442 Atrioventricular block, complete: Principal | ICD-10-CM | POA: Diagnosis present

## 2021-10-24 DIAGNOSIS — E559 Vitamin D deficiency, unspecified: Secondary | ICD-10-CM

## 2021-10-24 DIAGNOSIS — R42 Dizziness and giddiness: Secondary | ICD-10-CM | POA: Diagnosis not present

## 2021-10-24 DIAGNOSIS — I441 Atrioventricular block, second degree: Secondary | ICD-10-CM | POA: Diagnosis present

## 2021-10-24 DIAGNOSIS — I5032 Chronic diastolic (congestive) heart failure: Secondary | ICD-10-CM | POA: Diagnosis present

## 2021-10-24 DIAGNOSIS — I2510 Atherosclerotic heart disease of native coronary artery without angina pectoris: Secondary | ICD-10-CM | POA: Insufficient documentation

## 2021-10-24 DIAGNOSIS — E782 Mixed hyperlipidemia: Secondary | ICD-10-CM | POA: Diagnosis present

## 2021-10-24 DIAGNOSIS — I5033 Acute on chronic diastolic (congestive) heart failure: Secondary | ICD-10-CM | POA: Diagnosis present

## 2021-10-24 DIAGNOSIS — N189 Chronic kidney disease, unspecified: Secondary | ICD-10-CM | POA: Diagnosis not present

## 2021-10-24 DIAGNOSIS — Z950 Presence of cardiac pacemaker: Secondary | ICD-10-CM | POA: Diagnosis not present

## 2021-10-24 HISTORY — PX: PACEMAKER IMPLANT: EP1218

## 2021-10-24 LAB — CBC WITH DIFFERENTIAL/PLATELET
Abs Immature Granulocytes: 0.03 10*3/uL (ref 0.00–0.07)
Basophils Absolute: 0.1 10*3/uL (ref 0.0–0.1)
Basophils Relative: 1 %
Eosinophils Absolute: 0.5 10*3/uL (ref 0.0–0.5)
Eosinophils Relative: 5 %
HCT: 33.3 % — ABNORMAL LOW (ref 36.0–46.0)
Hemoglobin: 10.5 g/dL — ABNORMAL LOW (ref 12.0–15.0)
Immature Granulocytes: 0 %
Lymphocytes Relative: 19 %
Lymphs Abs: 2 10*3/uL (ref 0.7–4.0)
MCH: 32.2 pg (ref 26.0–34.0)
MCHC: 31.5 g/dL (ref 30.0–36.0)
MCV: 102.1 fL — ABNORMAL HIGH (ref 80.0–100.0)
Monocytes Absolute: 0.7 10*3/uL (ref 0.1–1.0)
Monocytes Relative: 7 %
Neutro Abs: 7.3 10*3/uL (ref 1.7–7.7)
Neutrophils Relative %: 68 %
Platelets: 269 10*3/uL (ref 150–400)
RBC: 3.26 MIL/uL — ABNORMAL LOW (ref 3.87–5.11)
RDW: 15.4 % (ref 11.5–15.5)
WBC: 10.6 10*3/uL — ABNORMAL HIGH (ref 4.0–10.5)
nRBC: 0 % (ref 0.0–0.2)

## 2021-10-24 LAB — CBC
HCT: 33.9 % — ABNORMAL LOW (ref 36.0–46.0)
Hemoglobin: 10.9 g/dL — ABNORMAL LOW (ref 12.0–15.0)
MCH: 32.9 pg (ref 26.0–34.0)
MCHC: 32.2 g/dL (ref 30.0–36.0)
MCV: 102.4 fL — ABNORMAL HIGH (ref 80.0–100.0)
Platelets: 264 10*3/uL (ref 150–400)
RBC: 3.31 MIL/uL — ABNORMAL LOW (ref 3.87–5.11)
RDW: 15.4 % (ref 11.5–15.5)
WBC: 11.3 10*3/uL — ABNORMAL HIGH (ref 4.0–10.5)
nRBC: 0 % (ref 0.0–0.2)

## 2021-10-24 LAB — TROPONIN I (HIGH SENSITIVITY)
Troponin I (High Sensitivity): 23 ng/L — ABNORMAL HIGH (ref <18)
Troponin I (High Sensitivity): 26 ng/L — ABNORMAL HIGH (ref <18)

## 2021-10-24 LAB — GLUCOSE, CAPILLARY: Glucose-Capillary: 164 mg/dL — ABNORMAL HIGH (ref 70–99)

## 2021-10-24 LAB — LIPID PANEL
Cholesterol: 129 mg/dL (ref 0–200)
HDL: 25 mg/dL — ABNORMAL LOW (ref >40)
LDL Cholesterol: 73 mg/dL (ref 0–99)
Total CHOL/HDL Ratio: 5.2 RATIO
Triglycerides: 155 mg/dL — ABNORMAL HIGH (ref <150)
VLDL: 31 mg/dL (ref 0–40)

## 2021-10-24 LAB — BASIC METABOLIC PANEL
Anion gap: 7 (ref 5–15)
BUN: 27 mg/dL — ABNORMAL HIGH (ref 8–23)
CO2: 26 mmol/L (ref 22–32)
Calcium: 9.1 mg/dL (ref 8.9–10.3)
Chloride: 104 mmol/L (ref 98–111)
Creatinine, Ser: 2.26 mg/dL — ABNORMAL HIGH (ref 0.44–1.00)
GFR, Estimated: 21 mL/min — ABNORMAL LOW (ref >60)
Glucose, Bld: 140 mg/dL — ABNORMAL HIGH (ref 70–99)
Potassium: 3.9 mmol/L (ref 3.5–5.1)
Sodium: 137 mmol/L (ref 135–145)

## 2021-10-24 LAB — PROTIME-INR
INR: 1.1 (ref 0.8–1.2)
Prothrombin Time: 14.4 seconds (ref 11.4–15.2)

## 2021-10-24 SURGERY — PACEMAKER IMPLANT

## 2021-10-24 MED ORDER — MIDAZOLAM HCL 5 MG/5ML IJ SOLN
INTRAMUSCULAR | Status: DC | PRN
  Administered 2021-10-24: 1 mg via INTRAVENOUS

## 2021-10-24 MED ORDER — ATORVASTATIN CALCIUM 40 MG PO TABS
40.0000 mg | ORAL_TABLET | Freq: Every day | ORAL | Status: DC
  Administered 2021-10-24 – 2021-10-25 (×2): 40 mg via ORAL
  Filled 2021-10-24 (×2): qty 1

## 2021-10-24 MED ORDER — CEFAZOLIN SODIUM-DEXTROSE 2-4 GM/100ML-% IV SOLN
INTRAVENOUS | Status: AC
  Filled 2021-10-24: qty 100

## 2021-10-24 MED ORDER — CEFAZOLIN SODIUM-DEXTROSE 2-4 GM/100ML-% IV SOLN
2.0000 g | INTRAVENOUS | Status: AC
  Administered 2021-10-24: 2 g via INTRAVENOUS

## 2021-10-24 MED ORDER — SODIUM CHLORIDE 0.9 % IV SOLN
80.0000 mg | INTRAVENOUS | Status: AC
  Administered 2021-10-24: 80 mg
  Filled 2021-10-24: qty 2

## 2021-10-24 MED ORDER — FENTANYL CITRATE (PF) 100 MCG/2ML IJ SOLN
INTRAMUSCULAR | Status: DC | PRN
  Administered 2021-10-24: 25 ug via INTRAVENOUS

## 2021-10-24 MED ORDER — SODIUM CHLORIDE 0.9 % IV SOLN: INTRAVENOUS | Status: DC

## 2021-10-24 MED ORDER — VITAMIN D 25 MCG (1000 UNIT) PO TABS
5000.0000 [IU] | ORAL_TABLET | Freq: Every day | ORAL | Status: DC
  Administered 2021-10-24 – 2021-10-25 (×2): 5000 [IU] via ORAL
  Filled 2021-10-24 (×2): qty 5

## 2021-10-24 MED ORDER — HEPARIN (PORCINE) IN NACL 1000-0.9 UT/500ML-% IV SOLN
INTRAVENOUS | Status: DC | PRN
  Administered 2021-10-24: 500 mL

## 2021-10-24 MED ORDER — MIDAZOLAM HCL 5 MG/5ML IJ SOLN
INTRAMUSCULAR | Status: AC
  Filled 2021-10-24: qty 5

## 2021-10-24 MED ORDER — HYDRALAZINE HCL 50 MG PO TABS
50.0000 mg | ORAL_TABLET | Freq: Three times a day (TID) | ORAL | Status: DC
  Administered 2021-10-24 – 2021-10-25 (×3): 50 mg via ORAL
  Filled 2021-10-24 (×2): qty 1
  Filled 2021-10-24: qty 2

## 2021-10-24 MED ORDER — ISOSORBIDE MONONITRATE ER 30 MG PO TB24
30.0000 mg | ORAL_TABLET | Freq: Every day | ORAL | Status: DC
  Administered 2021-10-24 – 2021-10-25 (×2): 30 mg via ORAL
  Filled 2021-10-24 (×2): qty 1

## 2021-10-24 MED ORDER — SODIUM CHLORIDE 0.9 % IV SOLN
INTRAVENOUS | Status: AC
  Filled 2021-10-24: qty 2

## 2021-10-24 MED ORDER — ACETAMINOPHEN 325 MG PO TABS
650.0000 mg | ORAL_TABLET | ORAL | Status: DC | PRN
  Administered 2021-10-24 – 2021-10-25 (×2): 650 mg via ORAL
  Filled 2021-10-24: qty 2

## 2021-10-24 MED ORDER — FENTANYL CITRATE (PF) 100 MCG/2ML IJ SOLN
INTRAMUSCULAR | Status: AC
  Filled 2021-10-24: qty 2

## 2021-10-24 MED ORDER — ONDANSETRON HCL 4 MG/2ML IJ SOLN: 4.0000 mg | Freq: Four times a day (QID) | INTRAMUSCULAR | Status: DC | PRN

## 2021-10-24 MED ORDER — BUMETANIDE 0.5 MG PO TABS: 0.5000 mg | ORAL_TABLET | Freq: Every morning | ORAL | Status: DC

## 2021-10-24 MED ORDER — LIDOCAINE HCL 1 % IJ SOLN
INTRAMUSCULAR | Status: AC
  Filled 2021-10-24: qty 60

## 2021-10-24 MED ORDER — ALLOPURINOL 300 MG PO TABS
300.0000 mg | ORAL_TABLET | Freq: Every day | ORAL | Status: DC
  Administered 2021-10-24 – 2021-10-25 (×2): 300 mg via ORAL
  Filled 2021-10-24 (×3): qty 1

## 2021-10-24 MED ORDER — HEPARIN (PORCINE) IN NACL 1000-0.9 UT/500ML-% IV SOLN
INTRAVENOUS | Status: AC
  Filled 2021-10-24: qty 500

## 2021-10-24 MED ORDER — LIDOCAINE HCL (PF) 1 % IJ SOLN
INTRAMUSCULAR | Status: DC | PRN
  Administered 2021-10-24: 60 mL

## 2021-10-24 SURGICAL SUPPLY — 14 items
CABLE SURGICAL S-101-97-12 (CABLE) ×1 IMPLANT
CATH CPS LOCATOR 3D MED (CATHETERS) IMPLANT
CPS IMPLANT KIT 410190 (MISCELLANEOUS) IMPLANT
HELIX LOCKING TOOL (MISCELLANEOUS) ×1
KIT MICROPUNCTURE NIT STIFF (SHEATH) IMPLANT
LEAD ULTIPACE 52 LPA1231/52 (Lead) IMPLANT
LEAD ULTIPACE 65 LPA1231/65 (Lead) IMPLANT
PACEMAKER ASSURITY DR-RF (Pacemaker) IMPLANT
PAD DEFIB RADIO PHYSIO CONN (PAD) ×1 IMPLANT
SHEATH 7FR PRELUDE SNAP 13 (SHEATH) IMPLANT
SHEATH 9FR PRELUDE SNAP 13 (SHEATH) IMPLANT
SLITTER AGILIS HISPRO (INSTRUMENTS) IMPLANT
TOOL HELIX LOCKING (MISCELLANEOUS) IMPLANT
TRAY PACEMAKER INSERTION (PACKS) ×1 IMPLANT

## 2021-10-24 NOTE — ED Notes (Signed)
Patient assisted to [facility].

## 2021-10-24 NOTE — ED Triage Notes (Signed)
Pt arrived from home with [facility] after having a syncopal episode at home with NV. EMS reports pulse 40, 1mg  Atropine given without improvement of pulse to 80. BP162/60. 20g IV

## 2021-10-24 NOTE — Progress Notes (Signed)
Patient known to our group and I was previously involved with decision to consider permanent pacemaker in view of patient being only on los dose BB with 25 mg Metop Succ. Do not think the medications is the etiology.  I will see the patient and refer for pacemaker implantation. Keep NPO

## 2021-10-24 NOTE — Consult Note (Signed)
ELECTROPHYSIOLOGY CONSULT NOTE    Patient ID: Connie Swanson MRN: [id number], DOB/AGE: [birth year] 83 y.o.  Admit date: 10/24/2021 Date of Consult: 10/24/2021  Primary Physician: Haywood Pao, MD Primary Cardiologist: None  Electrophysiologist: None  Referring Provider: Dr. Jannifer Franklin  Patient Profile: Connie Swanson is a 81 y.o. female with a history of HFpEF, single-vessel CAD (nonobstructive), insulin-dependent diabetes mellitus type 2, chronic kidney disease, hypertension, history of ischemic colitis, hypothyroidism, obesity who is being seen or the evaluation of CHB at the request of Dr. Jannifer Franklin.  HPI:  Connie Swanson is a 81 y.o. female, well-known to EP group from recent prior admission to [facility] with similar presenting symptoms and indication. Last admission, she her AV nodal blocker was held and her symptoms resolved. Since discharge, she was worn a Zio AT for close monitoring of her HR. Yesterday, around 4pm, she had an episode of "foggy-headedness" and presyncope but did not syncopize. She was called by a member of the Cardiology team quickly thereafter and was told that she had a 6.5sec. She was also instructed to proceed to nearest ER for further evaluation if she was symptomatic again. At around 11pm, she had another symptomatic bradycardic episode, did not syncopize, but did have episode of emesis. She checked her HR on home monitor at it shoed 30-40s, so she presented to [facility] ER.  Since being in ER, she has felt well without any episodes of "foggy-headedness" or presyncope. She has not taken her coreg since last hospitalization.   Regarding her HFpEF, she thinks that she is euvolemic, but does not show fluid retention with lower extremity edema. She denies weight gain or early satiety at this time.   Potassium3.9 (10/17 0105)   Creatinine, ser  2.26* (10/17 0105) PLT  264 (10/17 0146) HGB  10.9* (10/17 0146) WBC 11.3* (10/17 0146) Troponin I (High Sensitivity)26* (10/17 0410).      Past Medical History:  Diagnosis Date   CHF (congestive heart failure) (HCC)    Chronic kidney disease    Coronary artery disease    Diabetes mellitus without complication ([place])    Hyperlipidemia    Hypertension      Surgical History:  Past Surgical History:  Procedure Laterality Date   BREAST EXCISIONAL BIOPSY Right    HEMORRHOID SURGERY     MENISCUS REPAIR     THYROID SURGERY     VAGINAL HYSTERECTOMY       (Not in a hospital admission)   Inpatient Medications:   allopurinol  300 mg Oral Daily   atorvastatin  40 mg Oral Daily   [START ON 10/25/2021] bumetanide  0.5 mg Oral q morning   cholecalciferol  5,000 Units Oral Q1200   gentamicin (GARAMYCIN) 80 mg in sodium chloride 0.9 % 500 mL irrigation  80 mg Irrigation On Call   hydrALAZINE  50 mg Oral TID   isosorbide mononitrate  30 mg Oral Daily    Allergies:  Allergies  Allergen Reactions   Dapagliflozin Nausea And Vomiting    Farxiga    Social History   Socioeconomic History   Marital status: Widowed    Spouse name: Not on file   Number of children: 4   Years of education: Not on file   Highest education level: Not on file  Occupational History   Not on file  Tobacco Use   Smoking status: Never   Smokeless tobacco: Never  Vaping Use   Vaping Use: Never used  Substance and Sexual Activity  Alcohol use: Never   Drug use: Never   Sexual activity: Not on file  Other Topics Concern   Not on file  Social History Narrative   Not on file   Social Determinants of Health   Financial Resource Strain: Not on file  Food Insecurity: No Food Insecurity (10/18/2021)   Hunger Vital Sign    Worried About Running Out of Food in the Last Year: Never true    Ran Out of Food in the Last Year: Never true  Transportation Needs: No Transportation Needs (10/18/2021)   PRAPARE - Hydrologist (Medical): No    Lack of Transportation (Non-Medical): No  Physical Activity: Not on file   Stress: Not on file  Social Connections: Not on file  Intimate Partner Violence: Not At Risk (10/18/2021)   Humiliation, Afraid, Rape, and Kick questionnaire    Fear of Current or Ex-Partner: No    Emotionally Abused: No    Physically Abused: No    Sexually Abused: No     Family History  Adopted: Yes     Review of Systems: All other systems reviewed and are otherwise negative except as noted above.  Physical Exam: Vitals:   10/24/21 0715 10/24/21 0730 10/24/21 0745 10/24/21 0805  BP: (!) 110/52 119/61 123/79 (!) 157/80  Pulse: 79 77 81 85  Resp: 15 15 (!) 27 (!) 24  Temp:      TempSrc:      SpO2: 94% 94% 93% 97%    GEN- The patient is well appearing, alert and oriented x 3 today.   HEENT: normocephalic, atraumatic; sclera clear, conjunctiva pink; hearing intact; oropharynx clear; neck supple. JVD low-mid neck. Lungs- Clear to ausculation bilaterally, normal work of breathing.  No wheezes, rales, rhonchi Heart- Regular rate and rhythm, no murmurs, rubs or gallops GI- soft, non-tender, non-distended, bowel sounds present Extremities- no clubbing, cyanosis, Trace BLE; DP/PT/radial pulses 2+ bilaterally MS- no significant deformity or atrophy Skin- warm and dry, no rash or lesion Psych- euthymic mood, full affect Neuro- strength and sensation are intact  Labs:   Lab Results  Component Value Date   WBC 11.3 (H) 10/24/2021   HGB 10.9 (L) 10/24/2021   HCT 33.9 (L) 10/24/2021   MCV 102.4 (H) 10/24/2021   PLT 264 10/24/2021    Recent Labs  Lab 10/17/21 1540 10/17/21 1921 10/24/21 0105  NA 139   < > 137  K 4.2   < > 3.9  CL 107   < > 104  CO2 22   < > 26  BUN 29*   < > 27*  CREATININE 2.03*   < > 2.26*  CALCIUM 9.2   < > 9.1  PROT 6.4*  --   --   BILITOT 0.4  --   --   ALKPHOS 56  --   --   ALT 9  --   --   AST 13*  --   --   GLUCOSE 196*   < > 140*   < > = values in this interval not displayed.      Radiology/Studies: DG Chest Port 1 View  Result  Date: 10/24/2021 CLINICAL DATA:  Weakness, bradycardia, near syncope EXAM: PORTABLE CHEST 1 VIEW COMPARISON:  10/20/2021, 04/28/2020 FINDINGS: Cardiomegaly. Interstitial prominence most pronounced in the right upper lobe. No confluent opacities on the left. No effusions. Aortic atherosclerosis. No acute bony abnormality. IMPRESSION: Cardiomegaly. Interstitial prominence, most pronounced in the right upper lobe. This is stable  dating back to prior study 04/28/2020 most compatible with chronic lung disease. No acute cardiopulmonary disease. Electronically Signed   By: Rolm Baptise M.D.   On: 10/24/2021 01:14   DG Chest 1 View  Result Date: 10/20/2021 CLINICAL DATA:  Chest pain EXAM: CHEST  1 VIEW COMPARISON:  10/17/2021, 04/28/2020 FINDINGS: Cardiomegaly with central vascular congestion. No consolidation, pleural effusion or pneumothorax. Aortic atherosclerosis. Mild diffuse chronic interstitial opacity with scarring in the right apex. IMPRESSION: Central congestion.  Cardiomegaly with mild Electronically Signed   By: Donavan Foil M.D.   On: 10/20/2021 18:10   ECHOCARDIOGRAM LIMITED  Result Date: 10/19/2021    ECHOCARDIOGRAM LIMITED REPORT   Patient Name:   Connie Swanson Date of Exam: 10/19/2021 Medical Rec #:  [id number]   Height:       62.0 in Accession #:    [id number]  Weight:       197.9 lb Date of Birth:  12/01/1940   BSA:          1.903 m Patient Age:    66 years    BP:           157/67 mmHg Patient Gender: F           HR:           84 bpm. Exam Location:  Inpatient Procedure: Cardiac Doppler, Color Doppler and Limited Echo Indications:     I34.8 Other nonrheumatic mitral valve disorders.  History:         Patient has prior history of Echocardiogram examinations, most                  recent 10/18/2021. Abnormal ECG, Arrythmias:Bradycardia and                  RBBB; Risk Factors:Diabetes and Dyslipidemia.  Sonographer:     Roseanna Rainbow RDCS Referring Phys:  [id number] Rex Kras Diagnosing Phys: Rex Kras  DO  Sonographer Comments: Technically difficult study due to poor echo windows and patient is obese. Image acquisition challenging due to patient body habitus. Limited echo to rule out MV and LVOT gradient and measure LV thickness. IMPRESSIONS  1. LV gradient at rest approximately 8.5 mmHg and with Valsalva 13 mmHg.Marland Kitchen Left ventricular ejection fraction, by estimation, is 60 to 65%. The left ventricle has normal function. The left ventricle has no regional wall motion abnormalities. moderate to severe left ventricular hypertrophy. Diastolic function could not be evaluated due to mitral annular calcification underlying rhythm.  2. Right ventricular systolic function is normal. The right ventricular size is normal. There is mildly elevated pulmonary artery systolic pressure. The estimated right ventricular systolic pressure is 34.2 mmHg.  3. Left atrial size was mildly dilated.  4. A small pericardial effusion is present. The pericardial effusion is posterior to the left ventricle. There is no evidence of cardiac tamponade.  5. The mitral valve is degenerative. No evidence of mitral valve regurgitation. Degree of mitral stenosis is present, difficult to quantify given the underlying rhythm, fusion of E and A, and HR variability mitral stenosis. Moderate to severe mitral annular calcification.  6. The aortic valve is tricuspid. Aortic valve regurgitation is not visualized. Aortic valve sclerosis is present, with no evidence of aortic valve stenosis.  7. The inferior vena cava is normal in size with greater than 50% respiratory variability, suggesting right atrial pressure of 3 mmHg. FINDINGS  Left Ventricle: LV gradient at rest approximately 8.5 mmHg and with Valsalva 13 mmHg. Left ventricular  ejection fraction, by estimation, is 60 to 65%. The left ventricle has normal function. The left ventricle has no regional wall motion abnormalities. Moderate to severe left ventricular hypertrophy. Diastolic function could not be  evaluated due to mitral annular calcification underlying rhythm. Right Ventricle: The right ventricular size is normal. No increase in right ventricular wall thickness. Right ventricular systolic function is normal. There is mildly elevated pulmonary artery systolic pressure. The tricuspid regurgitant velocity is 3.21  m/s, and with an assumed right atrial pressure of 3 mmHg, the estimated right ventricular systolic pressure is 02.5 mmHg. Left Atrium: Left atrial size was mildly dilated. Right Atrium: Right atrial size was normal in size. Pericardium: A small pericardial effusion is present. The pericardial effusion is posterior to the left ventricle. There is no evidence of cardiac tamponade. Mitral Valve: The mitral valve is degenerative in appearance. There is moderate calcification of the posterior mitral valve leaflet(s). Mildly decreased mobility of the mitral valve leaflets. Moderate to severe mitral annular calcification. Degree of mitral stenosis is present, difficult to quantify given the underlying rhythm, fusion of E and A, and HR variability mitral valve stenosis. MV peak gradient, 19.1 mmHg. The mean mitral valve gradient is 9.0 mmHg. Tricuspid Valve: The tricuspid valve is grossly normal. Tricuspid valve regurgitation is mild . No evidence of tricuspid stenosis. Aortic Valve: The aortic valve is tricuspid. Aortic valve regurgitation is not visualized. Aortic valve sclerosis is present, with no evidence of aortic valve stenosis. Aortic valve mean gradient measures 8.0 mmHg. Aortic valve peak gradient measures 14.4 mmHg. Aortic valve area, by VTI measures 2.50 cm. Pulmonic Valve: The pulmonic valve was grossly normal. Pulmonic valve regurgitation is trivial. No evidence of pulmonic stenosis. Aorta: The aortic root is normal in size and structure. Venous: The inferior vena cava is normal in size with greater than 50% respiratory variability, suggesting right atrial pressure of 3 mmHg. LEFT VENTRICLE  PLAX 2D LVIDd:         4.60 cm LVIDs:         3.30 cm LV PW:         1.60 cm LV IVS:        1.70 cm LVOT diam:     2.10 cm LV SV:         100 LV SV Index:   53 LVOT Area:     3.46 cm  LV Volumes (MOD) LV vol d, MOD A2C: 99.7 ml LV vol d, MOD A4C: 121.0 ml LV vol s, MOD A2C: 35.6 ml LV vol s, MOD A4C: 55.1 ml LV SV MOD A2C:     64.1 ml LV SV MOD A4C:     121.0 ml LV SV MOD BP:      67.2 ml IVC IVC diam: 1.80 cm LEFT ATRIUM         Index LA diam:    4.00 cm 2.10 cm/m  AORTIC VALVE AV Area (Vmax):    2.81 cm AV Area (Vmean):   2.64 cm AV Area (VTI):     2.50 cm AV Vmax:           190.00 cm/s AV Vmean:          134.000 cm/s AV VTI:            0.401 m AV Peak Grad:      14.4 mmHg AV Mean Grad:      8.0 mmHg LVOT Vmax:         154.00 cm/s LVOT Vmean:  102.000 cm/s LVOT VTI:          0.290 m LVOT/AV VTI ratio: 0.72  AORTA Ao Root diam: 3.50 cm MITRAL VALVE                TRICUSPID VALVE MV Area (PHT): 5.38 cm     TR Peak grad:   41.2 mmHg MV Area VTI:   2.30 cm     TR Vmax:        321.00 cm/s MV Peak grad:  19.1 mmHg MV Mean grad:  9.0 mmHg     SHUNTS MV Vmax:       2.18 m/s     Systemic VTI:  0.29 m MV Vmean:      138.5 cm/s   Systemic Diam: 2.10 cm MV Decel Time: 141 msec MV E velocity: 133.00 cm/s MV A velocity: 180.00 cm/s MV E/A ratio:  0.74 Sunit Tolia DO Electronically signed by Rex Kras DO Signature Date/Time: 10/19/2021/6:41:43 PM    Final    ECHOCARDIOGRAM COMPLETE  Result Date: 10/19/2021    ECHOCARDIOGRAM REPORT   Patient Name:   Connie Swanson Date of Exam: 10/18/2021 Medical Rec #:  [id number]   Height:       62.0 in Accession #:    [id number]  Weight:       200.0 lb Date of Birth:  31-May-1940   BSA:          1.912 m Patient Age:    8 years    BP:           183/49 mmHg Patient Gender: F           HR:           46 bpm. Exam Location:  Inpatient Procedure: 2D Echo, Cardiac Doppler and Color Doppler Indications:    Heart block, Complete I44.2  History:        Patient has prior history of  Echocardiogram examinations, most                 recent 05/07/2020. CHF, CAD, Arrythmias:RBBB; Risk                 Factors:Diabetes, Dyslipidemia and Hypertension.  Sonographer:    Ronny Flurry Sonographer#2:  Raquel Sarna Senior Referring Phys: Rex Kras DO IMPRESSIONS  1. Left ventricular ejection fraction, by estimation, is 60 to 65%. The left ventricle has normal function. The left ventricle has no regional wall motion abnormalities. Left ventricular diastolic function could not be evaluated due to MAC and heart block.  2. Right ventricular systolic function is normal. The right ventricular size is normal.  3. Left atrial size was mildly dilated.  4. Cannot comment on mitral stenosis due to underlying rhythm and moderate-severe MAC posteriorly. The mitral valve is degenerative. No evidence of mitral valve regurgitation. Moderate mitral annular calcification.  5. The aortic valve is tricuspid. Aortic valve regurgitation is not visualized. Aortic valve sclerosis is present, with no evidence of aortic valve stenosis.  6. The inferior vena cava is dilated in size with >50% respiratory variability, suggesting right atrial pressure of 8 mmHg.  7. Rhythm strip during this exam demonstrates Sinus with variable heart block. Conclusion(s)/Recommendation(s): Recommend limited echo to re-evaluate left ventricular chamber quantification (images are missing), evaluate for hyperdynamic LV gradients, and re-evaluate for mitral stenosis. FINDINGS  Left Ventricle: Left ventricular ejection fraction, by estimation, is 60 to 65%. The left ventricle has normal function. The left ventricle has no regional wall motion abnormalities. The  left ventricular internal cavity size was normal in size. Left ventricular diastolic function could not be evaluated due to MAC and heart block. Right Ventricle: The right ventricular size is normal. No increase in right ventricular wall thickness. Right ventricular systolic function is normal. Left  Atrium: Left atrial size was mildly dilated. Right Atrium: Right atrial size was normal in size. Pericardium: There is no evidence of pericardial effusion. Mitral Valve: Cannot comment on mitral stenosis due to underlying rhythm and moderate-severe MAC posteriorly. The mitral valve is degenerative in appearance. Moderate mitral annular calcification. No evidence of mitral valve regurgitation. MV peak gradient, 14.4 mmHg. Tricuspid Valve: The tricuspid valve is grossly normal. Tricuspid valve regurgitation is mild . No evidence of tricuspid stenosis. Aortic Valve: The aortic valve is tricuspid. Aortic valve regurgitation is not visualized. Aortic valve sclerosis is present, with no evidence of aortic valve stenosis. Aortic valve mean gradient measures 7.5 mmHg. Aortic valve peak gradient measures 13.0 mmHg. Aortic valve area, by VTI measures 2.35 cm. Pulmonic Valve: The pulmonic valve was grossly normal. Pulmonic valve regurgitation is trivial. No evidence of pulmonic stenosis. Aorta: The aortic root is normal in size and structure. Venous: The inferior vena cava is dilated in size with greater than 50% respiratory variability, suggesting right atrial pressure of 8 mmHg. IAS/Shunts: The atrial septum is grossly normal. EKG: Rhythm strip during this exam demonstrates Sinus with variable heart block.  LEFT VENTRICLE PLAX 2D LVOT diam:     2.00 cm   Diastology LV SV:         104       LV e' lateral:   9.81 cm/s LV SV Index:   54        LV E/e' lateral: 18.7 LVOT Area:     3.14 cm  RIGHT VENTRICLE             IVC RV S prime:     11.00 cm/s  IVC diam: 2.20 cm TAPSE (M-mode): 1.7 cm LEFT ATRIUM             Index        RIGHT ATRIUM           Index LA Vol (A2C):   87.8 ml 45.93 ml/m  RA Area:     19.10 cm LA Vol (A4C):   71.9 ml 37.61 ml/m  RA Volume:   49.80 ml  26.05 ml/m LA Biplane Vol: 84.2 ml 44.04 ml/m  AORTIC VALVE AV Area (Vmax):    2.18 cm AV Area (Vmean):   2.16 cm AV Area (VTI):     2.35 cm AV Vmax:            180.00 cm/s AV Vmean:          126.500 cm/s AV VTI:            0.443 m AV Peak Grad:      13.0 mmHg AV Mean Grad:      7.5 mmHg LVOT Vmax:         124.80 cm/s LVOT Vmean:        86.900 cm/s LVOT VTI:          0.331 m LVOT/AV VTI ratio: 0.75  AORTA Ao Root diam: 3.10 cm MITRAL VALVE                TRICUSPID VALVE MV Area (PHT): 3.89 cm     TR Peak grad:   34.1 mmHg MV Area VTI:   1.19 cm  TR Vmax:        292.00 cm/s MV Peak grad:  14.4 mmHg MV Vmax:       1.90 m/s     SHUNTS MV Vmean:      66.1 cm/s    Systemic VTI:  0.33 m MV Decel Time: 195 msec     Systemic Diam: 2.00 cm MV E velocity: 183.00 cm/s MV A velocity: 133.00 cm/s MV E/A ratio:  1.38 Sunit Tolia DO Electronically signed by Rex Kras DO Signature Date/Time: 10/19/2021/6:19:55 PM    Final    DG Chest Port 1 View  Result Date: 10/17/2021 CLINICAL DATA:  Bradycardia, history CHF, hypertension, coronary artery disease, diabetes mellitus, chronic kidney disease EXAM: PORTABLE CHEST 1 VIEW COMPARISON:  Portable exam 1602 hours compared to 04/28/2020 FINDINGS: External pacing leads project over chest. Enlargement of cardiac silhouette. Mediastinal contours and pulmonary vascularity normal. Atherosclerotic calcification aorta. Chronic accentuation of RIGHT upper lobe markings with volume loss question scarring. Central peribronchial thickening. Additional mild prominence of interstitial markings mid to lower lungs unchanged. No definite acute infiltrate, pleural effusion or pneumothorax. Bones demineralized. IMPRESSION: Enlargement of cardiac silhouette. Bronchitic and chronic interstitial changes with RIGHT upper lobe scarring. No acute abnormalities. Aortic Atherosclerosis (ICD10-I70.0). Electronically Signed   By: Lavonia Dana M.D.   On: 10/17/2021 16:17    EKG: SR, LBBB (personally reviewed)  TELEMETRY: SR in 80-90s with freq PVCs (personally reviewed)  Assessment/Plan: #) Symptomatic Bradycardia #) Intermittent Complete Heart  Block Pt with recent admission for symp brady at which time all AV nodal blocking agents were held with improvement of HR. She presents now with bradycardic to the 40s with intermittent CHB noted on Zio monitor. ECG with NSR + LBBB.  No reversible causes identified; continue holding AV nodal blocking agents Plan for PM today, NPO   #) HTN #) HFpEF 10/12 echo - LVEF 60-65%; can't eval diastolic fxn in s/o MAC Continue home Hydralazine 23m po TID, Imdur 133mpo daily  Appears euvolemic on exam - will hold home Bumex 0.67m58mo daily at this time given she is NPO for procedure.  Jardiance held outpatient in the s/o CKD   #) HLD - continue home Atorvastatin 23m1m daily    #) Gout - continue home Allopurinol 300mg567mdaily    #) VDD - continue home Cholecalciferol 5000mg 467maily    For questions or updates, please contact [facility] Please consult [email or web address] for contact info under Cardiology/STEMI.  Signed, SuzannMamie Levers10/17/2023 9:29 AM

## 2021-10-24 NOTE — H&P (Signed)
CARDIOLOGY ADMIT NOTE   Patient ID: Connie Swanson MRN: [id number] DOB/AGE: 03/12/1940 81 y.o.  Admit date: 10/24/2021 Primary Physician:  Haywood Pao, MD  Patient ID: Connie Swanson, female    DOB: 04-08-1940, 81 y.o.   MRN: [id number]  Chief Complaint  Patient presents with   Bradycardia   Near Syncope   HPI:    Connie Swanson  is a 81 y.o. Caucasian female  with  whose past medical history of HFpEF, single-vessel CAD (nonobstructive), insulin-dependent diabetes mellitus type 2, chronic kidney disease, hypertension, history of ischemic colitis, hypothyroidism, intermittent left and right bundle branch block, who patient EKG had revealed complete heart block, was initially admitted to the hospital for consideration for pacemaker implantation, evaluated by EP, patient was on 25 mg of metoprolol succinate which was held and she was discharged home after emergency room and hospital admission on 10/17/2021 discharged 2 days later on 10/20/2021.  She was discharged with Zio patch monitoring life.  She had 2 alerts around 4:30 PM and also around 11 PM with episodes of symptomatic complete heart block and was advised to come to the emergency room.  She presented last night and has been evaluated by EP.  I have been following the patient although through the day since morning.  Patient has had no further complete heart block.  Patient's daughter is present at the bedside and states that she has had several episodes of syncope or near syncope and episodes that were activated by [facility] patch were symptomatic as well with near syncope.  Patient has been very disturbed and has been very concerned about her symptoms.  Presently feels comfortable being in the hospital, remains asymptomatic.  Over the past few months she has noticed decreased exercise tolerance and worsening dyspnea on exertion and episodes of near syncope  Past Medical History:  Diagnosis Date   CHF (congestive heart failure) (HCC)     Chronic kidney disease    Coronary artery disease    Diabetes mellitus without complication ([place])    Hyperlipidemia    Hypertension    Past Surgical History:  Procedure Laterality Date   BREAST EXCISIONAL BIOPSY Right    HEMORRHOID SURGERY     MENISCUS REPAIR     THYROID SURGERY     VAGINAL HYSTERECTOMY     Social History   Socioeconomic History   Marital status: Widowed    Spouse name: Not on file   Number of children: 4   Years of education: Not on file   Highest education level: Not on file  Occupational History   Not on file  Tobacco Use   Smoking status: Never   Smokeless tobacco: Never  Vaping Use   Vaping Use: Never used  Substance and Sexual Activity   Alcohol use: Never   Drug use: Never   Sexual activity: Not on file  Other Topics Concern   Not on file  Social History Narrative   Not on file   Social Determinants of Health   Financial Resource Strain: Not on file  Food Insecurity: No Food Insecurity (10/18/2021)   Hunger Vital Sign    Worried About Running Out of Food in the Last Year: Never true    Ran Out of Food in the Last Year: Never true  Transportation Needs: No Transportation Needs (10/18/2021)   PRAPARE - Hydrologist (Medical): No    Lack of Transportation (Non-Medical): No  Physical Activity: Not on file  Stress:  Not on file  Social Connections: Not on file  Intimate Partner Violence: Not At Risk (10/18/2021)   Humiliation, Afraid, Rape, and Kick questionnaire    Fear of Current or Ex-Partner: No    Emotionally Abused: No    Physically Abused: No    Sexually Abused: No   Family History  Adopted: Yes    ROS  Review of Systems  Constitutional: Positive for malaise/fatigue.  Cardiovascular:  Positive for dyspnea on exertion, near-syncope and palpitations. Negative for chest pain and leg swelling.   Objective      10/24/2021    8:05 AM 10/24/2021    7:45 AM 10/24/2021    7:30 AM  Vitals with BMI   Systolic 811 914 782  Diastolic 80 79 61  Pulse 85 81 77      Physical Exam Constitutional:      Appearance: She is obese.  Neck:     Vascular: No carotid bruit or JVD.  Cardiovascular:     Rate and Rhythm: Normal rate and regular rhythm.     Pulses: Intact distal pulses.     Heart sounds: Normal heart sounds. No murmur heard.    No gallop.  Pulmonary:     Effort: Pulmonary effort is normal.     Breath sounds: Normal breath sounds.  Abdominal:     General: Bowel sounds are normal.     Palpations: Abdomen is soft.  Musculoskeletal:     Right lower leg: No edema.     Left lower leg: No edema.    Laboratory examination:   Recent Labs    10/20/21 0330 10/20/21 1722 10/24/21 0105  NA 140 138 137  K 4.6 4.6 3.9  CL 106 104 104  CO2 27 25 26   GLUCOSE 137* 106* 140*  BUN 30* 31* 27*  CREATININE 1.88* 1.99* 2.26*  CALCIUM 9.7 9.9 9.1  GFRNONAA 26* 24* 21*   estimated creatinine clearance is 20.7 mL/min (A) (by C-G formula based on SCr of 2.26 mg/dL (H)).     Latest Ref Rng & Units 10/24/2021    1:05 AM 10/20/2021    5:22 PM 10/20/2021    3:30 AM  CMP  Glucose 70 - 99 mg/dL 140  106  137   BUN 8 - 23 mg/dL 27  31  30    Creatinine 0.44 - 1.00 mg/dL 2.26  1.99  1.88   Sodium 135 - 145 mmol/L 137  138  140   Potassium 3.5 - 5.1 mmol/L 3.9  4.6  4.6   Chloride 98 - 111 mmol/L 104  104  106   CO2 22 - 32 mmol/L 26  25  27    Calcium 8.9 - 10.3 mg/dL 9.1  9.9  9.7       Latest Ref Rng & Units 10/24/2021    1:46 AM 10/24/2021    1:05 AM 10/20/2021    5:22 PM  CBC  WBC 4.0 - 10.5 K/uL 11.3  10.6  12.1   Hemoglobin 12.0 - 15.0 g/dL 10.9  10.5  10.7   Hematocrit 36.0 - 46.0 % 33.9  33.3  33.4   Platelets 150 - 400 K/uL 264  269  298    Lipid Panel     Component Value Date/Time   CHOL 129 10/24/2021 0146   TRIG 155 (H) 10/24/2021 0146   HDL 25 (L) 10/24/2021 0146   CHOLHDL 5.2 10/24/2021 0146   VLDL 31 10/24/2021 0146   LDLCALC 73 10/24/2021 0146  HEMOGLOBIN A1C No results found for: "HGBA1C", "MPG" TSH Recent Labs    10/17/21 1540  TSH 2.542   BNP (last 3 results) Recent Labs    10/19/21 0040 10/20/21 1232  BNP 688.8* 204.9*   Cardiac Panel (last 3 results) Recent Labs    10/24/21 0105 10/24/21 0410  TROPONINIHS 23* 26*     Medications and allergies   Allergies  Allergen Reactions   Dapagliflozin Nausea And Vomiting    Farxiga      sodium chloride 50 mL/hr at 10/24/21 0802   sodium chloride      ceFAZolin (ANCEF) IV      Current Outpatient Medications  Medication Instructions   allopurinol (ZYLOPRIM) 300 mg, Oral, Daily   atorvastatin (LIPITOR) 40 mg, Oral, Daily   B-D INS SYR ULTRAFINE 1CC/30G 30G X 1/2" 1 ML MISC USE SYRINGE AS DIRECTED TWICE DAILY   bumetanide (BUMEX) 0.5 mg, Oral, Every morning   calcitRIOL (ROCALTROL) 0.25 mcg, Oral, Every other day, Mon, Wed, Fri   denosumab (PROLIA) 60 mg, Subcutaneous, Every 6 months   hydrALAZINE (APRESOLINE) 50 MG tablet TAKE 1 TABLET BY MOUTH THREE TIMES DAILY   isosorbide mononitrate (IMDUR) 30 MG 24 hr tablet Take 1/2 (one-half) tablet by mouth once daily   Vitamin D3 5,000 Units, Oral, Daily    I/O last 3 completed shifts: In: 240 [P.O.:240] Out: 400 [Urine:400] Total I/O In: 46.7 [I.V.:46.7] Out: 350 [Urine:350]    Radiology:  EP PPM/ICD IMPLANT  Result Date: 10/24/2021  CONCLUSIONS:  1. Intermittent complete heart block  2. Dual chamber permanent pacemaker with left bundle area lead  3.  No early apparent complications.   DG Chest Port 1 View  Result Date: 10/24/2021 CLINICAL DATA:  Weakness, bradycardia, near syncope EXAM: PORTABLE CHEST 1 VIEW COMPARISON:  10/20/2021, 04/28/2020 FINDINGS: Cardiomegaly. Interstitial prominence most pronounced in the right upper lobe. No confluent opacities on the left. No effusions. Aortic atherosclerosis. No acute bony abnormality. IMPRESSION: Cardiomegaly. Interstitial prominence, most pronounced in the  right upper lobe. This is stable dating back to prior study 04/28/2020 most compatible with chronic lung disease. No acute cardiopulmonary disease. Electronically Signed   By: Rolm Baptise M.D.   On: 10/24/2021 01:14    Cardiac Studies:   Echocardiogram  05/04/2020: Normal LV systolic function with visual EF 50-55%. Left ventricle cavity is normal in size. Severe concentric hypertrophy of the left ventricle. Normal global wall motion. Unable to evaluate diastolic function due to mitral annular calcification. Elevated LAP.  Native mitral valve. Posterior annular calcification. No evidence of mitral stenosis. Mild (Grade I) mitral regurgitation. Mild tricuspid regurgitation. No evidence of pulmonary hypertension. RVSP measures 32 mmHg. No prior studies available for comparison.   Pacemaker implantation 10/24/21: Abbott Assurity MRI dual chamber implantation for complete heart block.   EKG 10/24/2021: Normal sinus rhythm at rate of 93 bpm, left bundle branch block.  No further analysis.  Assessment   Symptomatic intermitttent complete heart block. LBBB Primary hypertension Palpitations.  Recommendations:   Patient is being admitted to the hospital, she will need permanent pacemaker implantation.  I have contacted personally spoken to the EP team regarding need for pacemaker implantation.  Patient has been kept n.p.o., after discussions with the EP, they do agree and she has been scheduled for pacemaker implantation today.  Addendum I will address her blood pressure as we go along.  She underwent pacemaker implantation this admission, I will follow-up tomorrow with regard to this.  She will need continued outpatient  monitoring of her pacemaker as well along with continued management of her chronic diastolic heart failure.  It will be much more simple and easy to manage her heart failure and episodes of palpitations now that she has a pacemaker.  Patient's family was very appreciative of  overall care.   Adrian Prows, MD, [facility] 10/24/2021, 9:11 AM Office: [phone] Fax: [phone] Pager: [phone]

## 2021-10-24 NOTE — H&P (Signed)
Cardiology Admission History and Physical   Patient ID: Connie Swanson MRN: [id number]; DOB: 01-Jul-1940   Admission date: 10/24/2021  PCP:  Haywood Pao, MD   [place] Providers Cardiologist:  None        Chief Complaint:  Pre-syncope  Patient Profile:   Connie Swanson is a 81 y.o. female with a PMHx of HFpEF, single-vessel CAD (nonobstructive), insulin-dependent diabetes mellitus type 2, chronic kidney disease, hypertension, history of ischemic colitis, hypothyroidism, obesity who is being seen 10/24/2021 for the evaluation of CHB.  History of Present Illness:   Connie Swanson is a 81 y.o. female with a PMHx of HFpEF, single-vessel CAD (nonobstructive), insulin-dependent diabetes mellitus type 2, chronic kidney disease, hypertension, history of ischemic colitis, hypothyroidism, obesity who presented to the ED complaining of lightheadedness and dizziness with concerns that she was going to pass out. She notes that she was call by the [place] and was told that she was in CHB and to come to the ED for evaluation. EMS was called and pt was noted to have HR 40s on their arrival. EMS tele strips not available for review. Pt was given Atropine x 1 via EMS with improvement of HR to the 90s. Upon arrival to the ED, HR improved. ECG NSR with LBBB noted. Labs pending.   Of note, pt presented for a regular office visit on 10/17/2021 complaining of fatigue, lightheadedness and dizzinessness. EKG illustrated underlying rhythm to be sinus with ventricular escape rhythm.  Due to symptomatic bradycardia she was referred to the ED for further evaluation and management. During admit, her underlying conduction improved and she was in NSR and her underlying heart rate was ~90 bpm.  EP was consulted at that time, and they recommend not restarting any AV nodal blocking agents.  Zio patch was placed for monitoring and pt was dc with outpatient follow-up.      Past Medical History:  Diagnosis  Date   CHF (congestive heart failure) (HCC)    Chronic kidney disease    Coronary artery disease    Diabetes mellitus without complication ([place])    Hyperlipidemia    Hypertension     Past Surgical History:  Procedure Laterality Date   BREAST EXCISIONAL BIOPSY Right    HEMORRHOID SURGERY     MENISCUS REPAIR     THYROID SURGERY     VAGINAL HYSTERECTOMY       Medications Prior to Admission: Prior to Admission medications   Medication Sig Start Date End Date Taking? Authorizing Provider  allopurinol (ZYLOPRIM) 300 MG tablet Take 300 mg by mouth daily.   Yes [provider]  atorvastatin (LIPITOR) 40 MG tablet Take 40 mg by mouth daily. 03/04/18  Yes [provider]  bumetanide (BUMEX) 0.5 MG tablet Take 1 tablet (0.5 mg total) by mouth every morning. 10/20/21 11/19/21 Yes Tolia, Sunit, DO  calcitRIOL (ROCALTROL) 0.25 MCG capsule Take 0.25 mcg by mouth every other day. Mon, Wed, Fri   Yes [provider]  Cholecalciferol (VITAMIN D3) 125 MCG (5000 UT) TABS Take 5,000 Units by mouth daily at 12 noon.   Yes [provider]  denosumab (PROLIA) 60 MG/ML SOSY injection Inject 60 mg into the skin every 6 (six) months.   Yes [provider]  hydrALAZINE (APRESOLINE) 50 MG tablet TAKE 1 TABLET BY MOUTH THREE TIMES DAILY Patient taking differently: Take 50 mg by mouth 3 (three) times daily. 09/01/21  Yes Tolia, Sunit, DO  isosorbide mononitrate (IMDUR)  30 MG 24 hr tablet Take 1/2 (one-half) tablet by mouth once daily Patient taking differently: Take 30 mg by mouth daily. 10/10/21  Yes Tolia, Sunit, DO  B-D INS SYR ULTRAFINE 1CC/30G 30G X 1/2" 1 ML MISC USE SYRINGE AS DIRECTED TWICE DAILY 08/26/19   [provider]     Allergies:    Allergies  Allergen Reactions   Dapagliflozin Nausea And Vomiting    Farxiga    Social History:   Social History   Socioeconomic History   Marital status: Widowed    Spouse name: Not on file   Number of  children: 4   Years of education: Not on file   Highest education level: Not on file  Occupational History   Not on file  Tobacco Use   Smoking status: Never   Smokeless tobacco: Never  Vaping Use   Vaping Use: Never used  Substance and Sexual Activity   Alcohol use: Never   Drug use: Never   Sexual activity: Not on file  Other Topics Concern   Not on file  Social History Narrative   Not on file   Social Determinants of Health   Financial Resource Strain: Not on file  Food Insecurity: No Food Insecurity (10/18/2021)   Hunger Vital Sign    Worried About Running Out of Food in the Last Year: Never true    Ran Out of Food in the Last Year: Never true  Transportation Needs: No Transportation Needs (10/18/2021)   PRAPARE - Hydrologist (Medical): No    Lack of Transportation (Non-Medical): No  Physical Activity: Not on file  Stress: Not on file  Social Connections: Not on file  Intimate Partner Violence: Not At Risk (10/18/2021)   Humiliation, Afraid, Rape, and Kick questionnaire    Fear of Current or Ex-Partner: No    Emotionally Abused: No    Physically Abused: No    Sexually Abused: No    Family History:   The patient's family history is not on file. She was adopted.    ROS:  Please see the history of present illness.  All other ROS reviewed and negative.     Physical Exam/Data:   Vitals:   10/24/21 0101  BP: (!) 155/77  Pulse: 90  Resp: 20  Temp: 98.5 F (36.9 C)  SpO2: 97%   No intake or output data in the 24 hours ending 10/24/21 0121    10/20/2021    5:08 PM 10/20/2021    4:19 AM 10/19/2021    4:30 AM  Last 3 Weights  Weight (lbs) 217 lb 9.5 oz 206 lb 9.1 oz 197 lb 14.4 oz  Weight (kg) 98.7 kg 93.7 kg 89.767 kg     There is no height or weight on file to calculate BMI.  General:  Well nourished, well developed, in no acute distress HEENT: normal Neck: no JVD Vascular: No carotid bruits; Distal pulses 2+ bilaterally    Cardiac:  normal S1, S2; RRR; no murmur  Lungs:  clear to auscultation bilaterally, no wheezing, rhonchi or rales  Abd: soft, nontender, no hepatomegaly  Ext: no edema Musculoskeletal:  No deformities, BUE and BLE strength normal and equal Skin: warm and dry  Neuro:  CNs 2-12 intact, no focal abnormalities noted Psych:  Normal affect    EKG:  The ECG that was done and was personally reviewed and demonstrates NSR + LBBB  Relevant CV Studies: Most recent TTE 10/19/2021:  1. LV gradient at  rest approximately 8.5 mmHg and with Valsalva 13 mmHg.Marland Kitchen  Left ventricular ejection fraction, by estimation, is 60 to 65%. The left  ventricle has normal function. The left ventricle has no regional wall  motion abnormalities. moderate to  severe left ventricular hypertrophy. Diastolic function could not be  evaluated due to mitral annular calcification underlying rhythm.   2. Right ventricular systolic function is normal. The right ventricular  size is normal. There is mildly elevated pulmonary artery systolic  pressure. The estimated right ventricular systolic pressure is 63.0 mmHg.   3. Left atrial size was mildly dilated.   4. A small pericardial effusion is present. The pericardial effusion is  posterior to the left ventricle. There is no evidence of cardiac  tamponade.   5. The mitral valve is degenerative. No evidence of mitral valve  regurgitation. Degree of mitral stenosis is present, difficult to quantify  given the underlying rhythm, fusion of E and A, and HR variability mitral  stenosis. Moderate to severe mitral  annular calcification.   6. The aortic valve is tricuspid. Aortic valve regurgitation is not  visualized. Aortic valve sclerosis is present, with no evidence of aortic  valve stenosis.   7. The inferior vena cava is normal in size with greater than 50%  respiratory variability, suggesting right atrial pressure of 3 mmHg.      Laboratory Data:  High Sensitivity  Troponin:   Recent Labs  Lab 10/17/21 1540 10/17/21 1921 10/20/21 1722  TROPONINIHS 13 20* 44*      Chemistry Recent Labs  Lab 10/20/21 0330 10/20/21 1722  NA 140 138  K 4.6 4.6  CL 106 104  CO2 27 25  GLUCOSE 137* 106*  BUN 30* 31*  CREATININE 1.88* 1.99*  CALCIUM 9.7 9.9  GFRNONAA 26* 24*  ANIONGAP 7 9    Recent Labs  Lab 10/17/21 1540  PROT 6.4*  ALBUMIN 2.8*  AST 13*  ALT 9  ALKPHOS 56  BILITOT 0.4   Lipids No results for input(s): "CHOL", "TRIG", "HDL", "LABVLDL", "LDLCALC", "CHOLHDL" in the last 168 hours. Hematology Recent Labs  Lab 10/19/21 0040 10/20/21 1722  WBC 13.1* 12.1*  RBC 3.00* 3.29*  HGB 10.2* 10.7*  HCT 30.3* 33.4*  MCV 101.0* 101.5*  MCH 34.0 32.5  MCHC 33.7 32.0  RDW 16.0* 15.4  PLT 258 298   Thyroid  Recent Labs  Lab 10/17/21 1540  TSH 2.542   BNP Recent Labs  Lab 10/19/21 0040 10/20/21 1232  BNP 688.8* 204.9*    DDimer No results for input(s): "DDIMER" in the last 168 hours.   Radiology/Studies:  DG Chest Port 1 View  Result Date: 10/24/2021 CLINICAL DATA:  Weakness, bradycardia, near syncope EXAM: PORTABLE CHEST 1 VIEW COMPARISON:  10/20/2021, 04/28/2020 FINDINGS: Cardiomegaly. Interstitial prominence most pronounced in the right upper lobe. No confluent opacities on the left. No effusions. Aortic atherosclerosis. No acute bony abnormality. IMPRESSION: Cardiomegaly. Interstitial prominence, most pronounced in the right upper lobe. This is stable dating back to prior study 04/28/2020 most compatible with chronic lung disease. No acute cardiopulmonary disease. Electronically Signed   By: Rolm Baptise M.D.   On: 10/24/2021 01:14     Assessment and Plan:   Symptomatic Bradycardia Intermittent Complete Heart Block - Pt with recent admission for symp brady at which time all AV nodal blocking agents were held with improvement of HR. She presents today bradycardic to the 40s with intermittent CHB noted on Zio monitor. ECG  with NSR + LBBB. Labs pending. -  Plan:   - continue to hold AV nodal blocking agents   - EP consult in AM as pt now has indication for PPM placement  HTN HFpEF BP (!) 155/77 (BP Location: Left Arm)   Pulse 90   Temp 98.5 F (36.9 C)   Resp 20   SpO2 97%  - TTE can't eval diastolic fxn in s/o MAC - continue home Hydralazine 49m po TID - continue home Imdur 149mpo daily  - pt euvolemic on exam - will hold home Bumex 0.62m37mo daily prn at this time  - Jardiance held outpatient in the s/o CKD  HLD No results found for: "LDLCALC" - continue home Atorvastatin 24m11m daily   Gout - continue home Allopurinol 300mg26mdaily   VDD - continue home Cholecalciferol 5000mg 39maily    Risk Assessment/Risk Scores:     Severity of Illness: The appropriate patient status for this patient is INPATIENT. Inpatient status is judged to be reasonable and necessary in order to provide the required intensity of service to ensure the patient's safety. The patient's presenting symptoms, physical exam findings, and initial radiographic and laboratory data in the context of their chronic comorbidities is felt to place them at high risk for further clinical deterioration. Furthermore, it is not anticipated that the patient will be medically stable for discharge from the hospital within 2 midnights of admission.   * I certify that at the point of admission it is my clinical judgment that the patient will require inpatient hospital care spanning beyond 2 midnights from the point of admission due to high intensity of service, high risk for further deterioration and high frequency of surveillance required.*   For questions or updates, please contact [facility] consult [email or web address] for contact info under     Signed, JessicTemple Pacini10/17/2023 1:21 AM

## 2021-10-24 NOTE — ED Provider Notes (Signed)
[facility] EMERGENCY DEPARTMENT Provider Note   CSN: [id number] Arrival date & time: 10/24/21  0051     History  Chief Complaint  Patient presents with   Bradycardia   Near Syncope    Connie Swanson is a 81 y.o. female.  The history is provided by the patient.  Patient history of chronic kidney disease, CAD, diabetes presents with lightheadedness and near syncope.  Patient reports recently diagnosed with a low heart rate and has been wearing a patch at home.  Approximately 4 PM yesterday she had an episode of lightheadedness.  Just before bedtime she had a similar episode.  Patient reports an episode of nausea & vomiting  she called EMS who noted her heart rate was in the 40s.  She was given atropine with improvement in her symptoms.  She reports that she was called by cardiology earlier in the day after the also lightheadedness.    Past Medical History:  Diagnosis Date   CHF (congestive heart failure) (HCC)    Chronic kidney disease    Coronary artery disease    Diabetes mellitus without complication ([place])    Hyperlipidemia    Hypertension     Home Medications Prior to Admission medications   Medication Sig Start Date End Date Taking? Authorizing Provider  allopurinol (ZYLOPRIM) 300 MG tablet Take 300 mg by mouth daily.   Yes [provider]  atorvastatin (LIPITOR) 40 MG tablet Take 40 mg by mouth daily. 03/04/18  Yes [provider]  bumetanide (BUMEX) 0.5 MG tablet Take 1 tablet (0.5 mg total) by mouth every morning. 10/20/21 11/19/21 Yes Tolia, Sunit, DO  calcitRIOL (ROCALTROL) 0.25 MCG capsule Take 0.25 mcg by mouth every other day. Mon, Wed, Fri   Yes [provider]  Cholecalciferol (VITAMIN D3) 125 MCG (5000 UT) TABS Take 5,000 Units by mouth daily at 12 noon.   Yes [provider]  denosumab (PROLIA) 60 MG/ML SOSY injection Inject 60 mg into the skin every 6 (six) months.   Yes [provider]  hydrALAZINE  (APRESOLINE) 50 MG tablet TAKE 1 TABLET BY MOUTH THREE TIMES DAILY Patient taking differently: Take 50 mg by mouth 3 (three) times daily. 09/01/21  Yes Tolia, Sunit, DO  isosorbide mononitrate (IMDUR) 30 MG 24 hr tablet Take 1/2 (one-half) tablet by mouth once daily Patient taking differently: Take 30 mg by mouth daily. 10/10/21  Yes Tolia, Sunit, DO  B-D INS SYR ULTRAFINE 1CC/30G 30G X 1/2" 1 ML MISC USE SYRINGE AS DIRECTED TWICE DAILY 08/26/19   [provider]      Allergies    Dapagliflozin    Review of Systems   Review of Systems  Constitutional:  Positive for fatigue. Negative for fever.  Respiratory:  Negative for shortness of breath.   Cardiovascular:  Negative for chest pain.  Gastrointestinal:  Positive for nausea and vomiting.  Neurological:  Positive for light-headedness.    Physical Exam Updated Vital Signs BP (!) 155/77 (BP Location: Left Arm)   Pulse 90   Temp 98.5 F (36.9 C)   Resp 20   SpO2 97%  Physical Exam CONSTITUTIONAL: Elderly, no acute distress HEAD: Normocephalic/atraumatic EYES: EOMI/PERRL ENMT: Mucous membranes moist NECK: supple no meningeal signs SPINE/BACK:entire spine nontender CV: S1/S2 noted, no murmurs/rubs/gallops noted LUNGS: Crackles bilaterally, no acute distress ABDOMEN: soft NEURO: Pt is awake/alert/appropriate, moves all extremitiesx4.  No facial droop.   EXTREMITIES: full ROM SKIN: warm, color normal PSYCH: no abnormalities of mood noted, alert  and oriented to situation  ED Results / Procedures / Treatments   Labs (all labs ordered are listed, but only abnormal results are displayed) Labs Reviewed  CBC WITH DIFFERENTIAL/PLATELET - Abnormal; Notable for the following components:      Result Value   WBC 10.6 (*)    RBC 3.26 (*)    Hemoglobin 10.5 (*)    HCT 33.3 (*)    MCV 102.1 (*)    All other components within normal limits  BASIC METABOLIC PANEL - Abnormal; Notable for the following components:   Glucose, Bld  140 (*)    BUN 27 (*)    Creatinine, Ser 2.26 (*)    GFR, Estimated 21 (*)    All other components within normal limits  CBC - Abnormal; Notable for the following components:   WBC 11.3 (*)    RBC 3.31 (*)    Hemoglobin 10.9 (*)    HCT 33.9 (*)    MCV 102.4 (*)    All other components within normal limits  TROPONIN I (HIGH SENSITIVITY) - Abnormal; Notable for the following components:   Troponin I (High Sensitivity) 23 (*)    All other components within normal limits  LIPID PANEL  PROTIME-INR  TROPONIN I (HIGH SENSITIVITY)    EKG EKG Interpretation  Date/Time:  Tuesday October 24 2021 00:54:11 EDT Ventricular Rate:  93 PR Interval:  171 QRS Duration: 155 QT Interval:  404 QTC Calculation: 503 R Axis:   -31 Text Interpretation: Sinus rhythm Left bundle branch block Confirmed by Ripley Fraise [phone]) on 10/24/2021 12:55:58 AM Prehospital EKG revealed sinus rhythm with left bundle branch block  Radiology DG Chest Port 1 View  Result Date: 10/24/2021 CLINICAL DATA:  Weakness, bradycardia, near syncope EXAM: PORTABLE CHEST 1 VIEW COMPARISON:  10/20/2021, 04/28/2020 FINDINGS: Cardiomegaly. Interstitial prominence most pronounced in the right upper lobe. No confluent opacities on the left. No effusions. Aortic atherosclerosis. No acute bony abnormality. IMPRESSION: Cardiomegaly. Interstitial prominence, most pronounced in the right upper lobe. This is stable dating back to prior study 04/28/2020 most compatible with chronic lung disease. No acute cardiopulmonary disease. Electronically Signed   By: Rolm Baptise M.D.   On: 10/24/2021 01:14    Procedures Procedures    Medications Ordered in ED Medications  allopurinol (ZYLOPRIM) tablet 300 mg (has no administration in time range)  atorvastatin (LIPITOR) tablet 40 mg (has no administration in time range)  hydrALAZINE (APRESOLINE) tablet 50 mg (has no administration in time range)  isosorbide mononitrate (IMDUR) 24 hr tablet  30 mg (has no administration in time range)  cholecalciferol (VITAMIN D3) 25 MCG (1000 UNIT) tablet 5,000 Units (has no administration in time range)  acetaminophen (TYLENOL) tablet 650 mg (has no administration in time range)  ondansetron (ZOFRAN) injection 4 mg (has no administration in time range)    ED Course/ Medical Decision Making/ A&P Clinical Course as of 10/24/21 0229  Tue Oct 24, 2021  0107 Patient with recent admission found to have complete heart block that resolved.  She did not receive a pacemaker, but was discharged with a Zio patch.  She has been symptomatic at home and a brief episode of heart block had been identified.  Heart rate is now in the 90s.  We will consult cardiology [DW]  0120 Discussed with cardiology for admission [DW]  0124 WBC(!): 10.6 Mild leukocytosis [DW]  0151 Creatinine(!): 2.26 Acute on chronic renal insufficiency [DW]  0228 Pt stable, no new bradycardic episodes.  She is awaiting admission  [  DW]    Clinical Course User Index [DW] Ripley Fraise, MD                           Medical Decision Making Amount and/or Complexity of Data Reviewed Labs: ordered. Decision-making details documented in ED Course. Radiology: ordered. ECG/medicine tests: ordered.  Risk Decision regarding hospitalization.   This patient presents to the ED for concern of weakness, this involves an extensive number of treatment options, and is a complaint that carries with it a high risk of complications and morbidity.  The differential diagnosis includes but is not limited to CVA, intracranial hemorrhage, acute coronary syndrome, renal failure, urinary tract infection, electrolyte disturbance, pneumonia AV block  Comorbidities that complicate the patient evaluation: Patient's presentation is complicated by their history of CAD, diabetes   Additional history obtained: Additional history obtained from EMS  Records reviewed  outpatient records reviewed  Lab Tests: I  Ordered, and personally interpreted labs.  The pertinent results include:  leukocytosis, acute on chronic renal failure  Imaging Studies ordered: I ordered imaging studies including X-ray chest   I independently visualized and interpreted imaging which showed cardiomegaly I agree with the radiologist interpretation  Cardiac Monitoring: The patient was maintained on a cardiac monitor.  I personally viewed and interpreted the cardiac monitor which showed an underlying rhythm of:  sinus rhythm   Critical Interventions:  admission to cardiology  Consultations Obtained: I requested consultation with the consultant dr Jannifer Franklin with cardiology , and discussed  findings as well as pertinent plan - they recommend: will admit, likely pacemaker placement  Reevaluation: After the interventions noted above, I reevaluated the patient and found that they have :stayed the same  Complexity of problems addressed: Patient's presentation is most consistent with  acute presentation with potential threat to life or bodily function  Disposition: After consideration of the diagnostic results and the patient's response to treatment,  I feel that the patent would benefit from admission   .           Final Clinical Impression(s) / ED Diagnoses Final diagnoses:  Near syncope  Symptomatic bradycardia    Rx / DC Orders ED Discharge Orders     None         Ripley Fraise, MD 10/24/21 [id number]

## 2021-10-24 NOTE — Plan of Care (Signed)
  Problem: Activity: Goal: Risk for activity intolerance will decrease Outcome: Progressing   Problem: Activity: Goal: Capacity to carry out activities will improve Outcome: Progressing   Problem: Cardiac: Goal: Ability to achieve and maintain adequate cardiopulmonary perfusion will improve Outcome: Progressing

## 2021-10-25 ENCOUNTER — Inpatient Hospital Stay (HOSPITAL_COMMUNITY): Payer: Medicare Other

## 2021-10-25 ENCOUNTER — Encounter (HOSPITAL_COMMUNITY): Payer: Self-pay | Admitting: Cardiology

## 2021-10-25 ENCOUNTER — Other Ambulatory Visit: Payer: Self-pay

## 2021-10-25 DIAGNOSIS — I442 Atrioventricular block, complete: Principal | ICD-10-CM

## 2021-10-25 DIAGNOSIS — R001 Bradycardia, unspecified: Secondary | ICD-10-CM | POA: Diagnosis not present

## 2021-10-25 DIAGNOSIS — I5032 Chronic diastolic (congestive) heart failure: Secondary | ICD-10-CM | POA: Diagnosis not present

## 2021-10-25 DIAGNOSIS — Z950 Presence of cardiac pacemaker: Secondary | ICD-10-CM | POA: Diagnosis not present

## 2021-10-25 DIAGNOSIS — N189 Chronic kidney disease, unspecified: Secondary | ICD-10-CM | POA: Diagnosis not present

## 2021-10-25 DIAGNOSIS — I441 Atrioventricular block, second degree: Secondary | ICD-10-CM | POA: Diagnosis present

## 2021-10-25 LAB — BASIC METABOLIC PANEL
Anion gap: 9 (ref 5–15)
BUN: 27 mg/dL — ABNORMAL HIGH (ref 8–23)
CO2: 23 mmol/L (ref 22–32)
Calcium: 9.3 mg/dL (ref 8.9–10.3)
Chloride: 107 mmol/L (ref 98–111)
Creatinine, Ser: 2.07 mg/dL — ABNORMAL HIGH (ref 0.44–1.00)
GFR, Estimated: 23 mL/min — ABNORMAL LOW (ref >60)
Glucose, Bld: 236 mg/dL — ABNORMAL HIGH (ref 70–99)
Potassium: 4.3 mmol/L (ref 3.5–5.1)
Sodium: 139 mmol/L (ref 135–145)

## 2021-10-25 LAB — MAGNESIUM: Magnesium: 1.9 mg/dL (ref 1.7–2.4)

## 2021-10-25 LAB — BRAIN NATRIURETIC PEPTIDE: B Natriuretic Peptide: 204.1 pg/mL — ABNORMAL HIGH (ref 0.0–100.0)

## 2021-10-25 MED ORDER — METOPROLOL SUCCINATE ER 50 MG PO TB24: 50.0000 mg | ORAL_TABLET | Freq: Every day | ORAL | 11 refills | Status: DC

## 2021-10-25 MED ORDER — METOPROLOL SUCCINATE ER 50 MG PO TB24
50.0000 mg | ORAL_TABLET | Freq: Every day | ORAL | Status: DC
  Administered 2021-10-25: 50 mg via ORAL
  Filled 2021-10-25: qty 1

## 2021-10-25 MED ORDER — ACETAMINOPHEN 325 MG PO TABS: 650.0000 mg | ORAL_TABLET | ORAL | Status: AC | PRN

## 2021-10-25 NOTE — Telephone Encounter (Signed)
Was discharged home on 10/25/2021 with any needs mammogram.  Please monitor her kidney function.  Currently off of Jardiance and Entresto and Coreg.  Vaani Morren Helena, DO, [facility]

## 2021-10-25 NOTE — Progress Notes (Signed)
Patient has code 56. Notified Case Manager.

## 2021-10-25 NOTE — Consult Note (Signed)
   [facility] CM Inpatient Consult   10/25/2021  [name] [birth year] [id number]  [facility] [[facility]] Patient: Connie Swanson [facility]  Primary Care Provider:  Haywood Pao, MD, [facility]  Patient screened for less than 7 days readmission hospitalization with noted Medium risk score for unplanned readmission risk and to assess for potential [facility] service needs for post hospital transition for care coordination and readmission prevention follow up.  Review of patient's medical record reveals o current SDOH needs,  patient is for home today.  Met with patient and son, Connie Swanson at the bedside to explained [facility] services for post hospital follow up needs for readmission prevention and explained patient may likely get a transition of care call. Patient given a 24 hour nurse line magnet. Patient endorses PCP and states she is followed by a pharmacist from the provider.   Plan:  Will assign for post hospital follow up for care coordination/education needs.  . For questions contact:   Natividad Brood, RN BSN [facility] Liaison  [phone] business mobile phone Toll free office [phone]  Fax number: [phone] [email or web address]

## 2021-10-25 NOTE — TOC Transition Note (Signed)
Transition of Care [facility]) - CM/SW Discharge Note   Patient Details  Name: Connie Swanson MRN: [id number] Date of Birth: 1940/12/03  [facility] of [facility] CM/SW Contact:  Zenon Mayo, RN Phone Number: 10/25/2021, 12:23 PM   Clinical Narrative:    Patient is for dc today, her son, Amariana Mirando will transport her home today.  She did not have any HH services pta.  She lives with her daughter [name] .  Patient is pretty indep at home, she uses a walker at home . She has a good support system  at home.  She has PCP.  Patient states she will call to make her follow up apt.          Patient Goals and CMS Choice        Discharge Placement                       Discharge Plan and Services                                     Social Determinants of Health (SDOH) Interventions     Readmission Risk Interventions     No data to display

## 2021-10-25 NOTE — Telephone Encounter (Signed)
Called and spoke to patient she stated it has not been long that she got back home but overall is doing fine, she feels fine. Her arm is a little sore but she was made aware that had be expected but she is doing well.

## 2021-10-25 NOTE — Care Management Obs Status (Signed)
MEDICARE OBSERVATION STATUS NOTIFICATION   Patient Details  Name: Connie Swanson MRN: [id number] Date of Birth: [birth year]   Medicare Observation Status Notification Given:  Yes    Zenon Mayo, RN 10/25/2021, 1:31 PM

## 2021-10-25 NOTE — Progress Notes (Signed)
Patient discharging home. Vital signs stable at time of discharge as reflected in discharge summary. Discharge instructions given and verbal understanding returned. No questions at this time. 

## 2021-10-25 NOTE — Progress Notes (Signed)
Patient has 6 beat run of vtach. Patient asymptomatic, notified MD.

## 2021-10-25 NOTE — Discharge Summary (Signed)
ELECTROPHYSIOLOGY PROCEDURE DISCHARGE SUMMARY    Patient ID: Connie Swanson,  MRN: [id number], DOB/AGE: 1940/10/21 81 y.o.  Admit date: 10/24/2021 Discharge date: 10/25/2021  Primary Care Physician: Haywood Pao, MD  Primary Cardiologist: None  Electrophysiologist: new to Dr. Quentin Ore   Primary Discharge Diagnosis:  Symptomatic bradycardia, intermittent complete heart block status post pacemaker implantation this admission  Secondary Discharge Diagnosis:  HTN HFpEF HLD  Allergies  Allergen Reactions   Dapagliflozin Nausea And Vomiting    Farxiga     Procedures This Admission:  1.  Implantation of a Abbott Dual Chamber PPM on 10/24/2021 by Dr. Quentin Ore. The patient received a Abbott Assurity [id number] with a Abbott Ultipace 1231-52 right atrial lead and a Abbott Ultipace 1231-65 right ventricular lead.  There were no immediate post procedure complications.   2.  CXR on 10/25/2021 demonstrated no pneumothorax status post device implantation.       Brief HPI: Connie Swanson is a 81 y.o. female was admitted for intermittent complete heart block and symptomatic bradycardia and electrophysiology team asked to see for consideration of PPM implantation.  Past medical history includes HTN, HFpEF.  The patient has had symptomatic bradycardia without reversible causes identified.  Risks, benefits, and alternatives to PPM implantation were reviewed with the patient who wished to proceed.   Hospital Course:  The patient was admitted and underwent implantation of a St. Jude dual chamber PPM with details as outlined above.  She was monitored on telemetry overnight which demonstrated appropriate pacing.  Left chest was without hematoma or ecchymosis.  The device was interrogated and found to be functioning normally.  CXR was obtained and demonstrated no pneumothorax status post device implantation.  Wound care, arm mobility, and restrictions were reviewed with the patient.  The patient was  examined and considered stable for discharge to home.    Anticoagulation resumption This patient is not on anticoagulation        Physical Exam: Vitals:   10/24/21 2033 10/24/21 2353 10/25/21 0431 10/25/21 0956  BP: 139/63 (!) 151/67 (!) 145/70 (!) 152/77  Pulse: 96 90 94 85  Resp: 18 18 20 18   Temp: 98.2 F (36.8 C) 98.1 F (36.7 C) 98.7 F (37.1 C) 98.1 F (36.7 C)  TempSrc: Oral Oral Oral Oral  SpO2: 94% 95% 90% 93%  Weight:   89.8 kg     GEN- The patient is well appearing, alert and oriented x 3 today.   HEENT: normocephalic, atraumatic; sclera clear, conjunctiva pink; hearing intact; oropharynx clear; neck supple, no JVP Lymph- no cervical lymphadenopathy Lungs- Clear to ausculation bilaterally, normal work of breathing.  No wheezes, rales, rhonchi Heart- Regular rate and rhythm, no murmurs, rubs or gallops, PMI not laterally displaced GI- soft, non-tender, non-distended, bowel sounds present, no hepatosplenomegaly Extremities- no clubbing, cyanosis, or edema; DP/PT/radial pulses 2+ bilaterally MS- no significant deformity or atrophy Skin- warm and dry, no rash or lesion, left chest without hematoma/ecchymosis. Outer bandage removed, steri-strips in place and should remain in place until follow-up appointment Psych- euthymic mood, full affect Neuro- strength and sensation are intact   Labs:   Lab Results  Component Value Date   WBC 11.3 (H) 10/24/2021   HGB 10.9 (L) 10/24/2021   HCT 33.9 (L) 10/24/2021   MCV 102.4 (H) 10/24/2021   PLT 264 10/24/2021    Recent Labs  Lab 10/25/21 0954  NA 139  K 4.3  CL 107  CO2 23  BUN 27*  CREATININE 2.07*  CALCIUM 9.3  GLUCOSE 236*    Discharge Medications:  Allergies as of 10/25/2021       Reactions   Dapagliflozin Nausea And Vomiting   Farxiga        Medication List     TAKE these medications    acetaminophen 325 MG tablet Commonly known as: TYLENOL Take 2 tablets (650 mg total) by mouth every 4  (four) hours as needed for headache or mild pain.   allopurinol 300 MG tablet Commonly known as: ZYLOPRIM Take 300 mg by mouth daily.   atorvastatin 40 MG tablet Commonly known as: LIPITOR Take 40 mg by mouth daily.   B-D INS SYR ULTRAFINE 1CC/30G 30G X 1/2" 1 ML Misc Generic drug: Insulin Syringe-Needle U-100 USE SYRINGE AS DIRECTED TWICE DAILY   bumetanide 0.5 MG tablet Commonly known as: Bumex Take 1 tablet (0.5 mg total) by mouth every morning.   calcitRIOL 0.25 MCG capsule Commonly known as: ROCALTROL Take 0.25 mcg by mouth every other day. Mon, Wed, Fri   denosumab 60 MG/ML Sosy injection Commonly known as: PROLIA Inject 60 mg into the skin every 6 (six) months.   hydrALAZINE 50 MG tablet Commonly known as: APRESOLINE TAKE 1 TABLET BY MOUTH THREE TIMES DAILY   isosorbide mononitrate 30 MG 24 hr tablet Commonly known as: IMDUR Take 1/2 (one-half) tablet by mouth once daily What changed: See the new instructions.   metoprolol succinate 50 MG 24 hr tablet Commonly known as: TOPROL-XL Take 1 tablet (50 mg total) by mouth daily. Take with or immediately following a meal. Start taking on: October 26, 2021   Vitamin D3 125 MCG (5000 UT) Tabs Take 5,000 Units by mouth daily at 12 noon.         Disposition:    Follow-up [facility]. Go to.   Specialty: Cardiology Why: 11/1 at 2:20pm Contact information: [address], [place] [id number] [place] [phone]                Duration of Discharge Encounter: Greater than 30 minutes including physician time.  Signed, Mamie Levers, NP  10/25/2021 12:09 PM

## 2021-10-25 NOTE — Care Management CC44 (Signed)
Condition Code 44 Documentation Completed  Patient Details  Name: Connie Swanson MRN: [id number] Date of Birth: 07/31/1940   Condition Code 44 given:  Yes Patient signature on Condition Code 44 notice:  Yes Documentation of 2 MD's agreement:  Yes Code 44 added to claim:  Yes    Zenon Mayo, RN 10/25/2021, 1:31 PM

## 2021-10-25 NOTE — Discharge Instructions (Addendum)
After Your Pacemaker   You have a Abbott Pacemaker  ACTIVITY Do not lift your arm above shoulder height for 1 week after your procedure. After 7 days, you may progress as below.  You should remove your sling 24 hours after your procedure, unless otherwise instructed by your provider.     Wednesday November 01, 2021  Thursday November 02, 2021 Friday November 03, 2021 Saturday November 04, 2021   Do not lift, push, pull, or carry anything over 10 pounds with the affected arm until 6 weeks (Wednesday December 06, 2021 ) after your procedure.   You may drive AFTER your wound check, unless you have been told otherwise by your provider.   Ask your healthcare provider when you can go back to work   INCISION/Dressing If large square, outer bandage is left in place, this can be removed after 24 hours from your procedure. Do not remove steri-strips or glue as below.   Monitor your Pacemaker site for redness, swelling, and drainage. Call the device clinic at [phone] if you experience these symptoms or fever/chills.  If your incision is sealed with Steri-strips or staples, you may shower 7 days after your procedure or when told by your provider. Do not remove the steri-strips or let the shower hit directly on your site. You may wash around your site with soap and water.    If you were discharged in a sling, please do not wear this during the day more than 48 hours after your surgery unless otherwise instructed. This may increase the risk of stiffness and soreness in your shoulder.   Avoid lotions, ointments, or perfumes over your incision until it is well-healed.  You may use a hot tub or a pool AFTER your wound check appointment if the incision is completely closed.  Pacemaker Alerts:  Some alerts are vibratory and others beep. These are NOT emergencies. Please call our office to let us know. If this occurs at night or on weekends, it can wait until the next business day. Send a remote  transmission.  If your device is capable of reading fluid status (for heart failure), you will be offered monthly monitoring to review this with you.   DEVICE MANAGEMENT Remote monitoring is used to monitor your pacemaker from home. This monitoring is scheduled every 91 days by our office. It allows [place] to keep an eye on the functioning of your device to ensure it is working properly. You will routinely see your Electrophysiologist annually (more often if necessary).   You should receive your ID card for your new device in 4-8 weeks. Keep this card with you at all times once received. Consider wearing a medical alert bracelet or necklace.  Your Pacemaker may be MRI compatible. This will be discussed at your next office visit/wound check.  You should avoid contact with strong electric or magnetic fields.   Do not use amateur (ham) radio equipment or electric (arc) welding torches. MP3 player headphones with magnets should not be used. Some devices are safe to use if held at least 12 inches (30 cm) from your Pacemaker. These include power tools, lawn mowers, and speakers. If you are unsure if something is safe to use, ask your health care provider.  When using your cell phone, hold it to the ear that is on the opposite side from the Pacemaker. Do not leave your cell phone in a pocket over the Pacemaker.  You may safely use electric blankets, heating pads, computers, and microwave ovens.  Call the office right away if: You have chest pain. You feel more short of breath than you have felt before. You feel more light-headed than you have felt before. Your incision starts to open up.  This information is not intended to replace advice given to you by your health care provider. Make sure you discuss any questions you have with your health care provider.

## 2021-10-26 ENCOUNTER — Telehealth: Payer: Self-pay | Admitting: *Deleted

## 2021-10-26 NOTE — Chronic Care Management (AMB) (Signed)
  Care Coordination   Note   10/26/2021 Name: Connie Swanson MRN: [id number] DOB: December 25, 1940  Connie Swanson is a 81 y.o. year old female who sees [name], [name], MD for primary care. I reached out to [name] by phone today to offer care coordination services.  [name] was given information about Care Coordination services today including:   The Care Coordination services include support from the care team which includes your Nurse Coordinator, Clinical Social Worker, or Pharmacist.  The Care Coordination team is here to help remove barriers to the health concerns and goals most important to you. Care Coordination services are voluntary, and the patient may decline or stop services at any time by request to their care team member.   Care Coordination Consent Status: Patient agreed to services and verbal consent obtained.  Reminded patient to call [name], [name], MD office schedule hospital follow up.   Follow up plan:  Telephone appointment with care coordination team member scheduled for:  10/27/21  Encounter Outcome:  Pt. Scheduled  [place]  Direct Dial: [phone]

## 2021-10-27 ENCOUNTER — Ambulatory Visit: Payer: Self-pay

## 2021-10-27 NOTE — Patient Outreach (Signed)
  Care Coordination   Initial Visit Note   10/27/2021 Name: Connie Swanson MRN: [id number] DOB: [birth year]  Connie Swanson is a 81 y.o. year old female who sees Swanson, Connie Him, MD for primary care. I spoke with  Connie Swanson by phone today.  What matters to the patients health and wellness today?  Patient is weighing herself daily as directed. Patient has a newly placed pacemaker.     Goals Addressed               This Visit's Progress     Patient Stated     I am checking my weights daily (pt-stated)        Care Coordination Interventions: Basic overview and discussion of pathophysiology of Heart Failure reviewed Provided education on low sodium diet Reviewed Heart Failure Action Plan in depth and provided written copy Assessed need for readable accurate scales in home Provided education about placing scale on hard, flat surface Advised patient to weigh each morning after emptying bladder Discussed importance of daily weight and advised patient to weigh and record daily         SDOH assessments and interventions completed:  Yes  SDOH Interventions Today    Flowsheet Row Most Recent Value  SDOH Interventions   Transportation Interventions Intervention Not Indicated        Care Coordination Interventions Activated:  Yes  Care Coordination Interventions:  Yes, provided   Follow up plan:  1 01/27/21 @1 :45 PM    Encounter Outcome:  Pt. Visit Completed

## 2021-10-27 NOTE — Patient Instructions (Signed)
Visit Information  Thank you for taking time to visit with me today. Please don't hesitate to contact me if I can be of assistance to you.   Following are the goals we discussed today:   Goals Addressed               This Visit's Progress     Patient Stated     I am checking my weights daily (pt-stated)        Care Coordination Interventions: Basic overview and discussion of pathophysiology of Heart Failure reviewed Provided education on low sodium diet Reviewed Heart Failure Action Plan in depth and provided written copy Assessed need for readable accurate scales in home Provided education about placing scale on hard, flat surface Advised patient to weigh each morning after emptying bladder Discussed importance of daily weight and advised patient to weigh and record daily         Our next appointment is by telephone on 11/27/21 at 1:45 PM   Please call the care guide team at [phone] if you need to cancel or reschedule your appointment.   If you are experiencing a Mental Health or [place] or need someone to talk to, please call 1-800-273-TALK (toll free, 24 hour hotline) go to [facility] [place] [phone])  The patient verbalized understanding of instructions, educational materials, and care plan provided today and agreed to receive a mailed copy of patient instructions, educational materials, and care plan.   Barb Merino, RN, BSN, CCM Care Management Coordinator  [facility]  Direct Phone: [phone]

## 2021-10-30 ENCOUNTER — Other Ambulatory Visit (HOSPITAL_COMMUNITY): Payer: Self-pay | Admitting: *Deleted

## 2021-11-01 ENCOUNTER — Ambulatory Visit: Payer: Medicare Other | Admitting: Internal Medicine

## 2021-11-01 ENCOUNTER — Encounter (HOSPITAL_COMMUNITY): Payer: Medicare Other

## 2021-11-01 ENCOUNTER — Encounter: Payer: Self-pay | Admitting: Cardiology

## 2021-11-01 ENCOUNTER — Ambulatory Visit: Payer: Medicare Other | Admitting: Cardiology

## 2021-11-01 VITALS — BP 157/75 | HR 73 | Temp 97.9°F | Ht 62.0 in | Wt 206.6 lb

## 2021-11-01 DIAGNOSIS — I442 Atrioventricular block, complete: Secondary | ICD-10-CM

## 2021-11-01 DIAGNOSIS — Z950 Presence of cardiac pacemaker: Secondary | ICD-10-CM | POA: Diagnosis not present

## 2021-11-01 DIAGNOSIS — R001 Bradycardia, unspecified: Secondary | ICD-10-CM

## 2021-11-01 DIAGNOSIS — I10 Essential (primary) hypertension: Secondary | ICD-10-CM

## 2021-11-01 DIAGNOSIS — E782 Mixed hyperlipidemia: Secondary | ICD-10-CM

## 2021-11-01 DIAGNOSIS — E1165 Type 2 diabetes mellitus with hyperglycemia: Secondary | ICD-10-CM

## 2021-11-01 DIAGNOSIS — I5032 Chronic diastolic (congestive) heart failure: Secondary | ICD-10-CM

## 2021-11-01 DIAGNOSIS — R55 Syncope and collapse: Secondary | ICD-10-CM | POA: Diagnosis not present

## 2021-11-01 DIAGNOSIS — I2510 Atherosclerotic heart disease of native coronary artery without angina pectoris: Secondary | ICD-10-CM

## 2021-11-01 NOTE — Progress Notes (Signed)
ID:  Connie Swanson, DOB 10/02/1940, MRN [id number]  PCP:  Haywood Pao, MD  Cardiologist:  Rex Kras, DO, [facility] (established care 04/27/2020) Former Cardiology Providers: Dr. Adrian Prows  Nephrologist: Dr. Posey Pronto   Date: 11/01/21 Last Office Visit: 10/17/2021  Chief Complaint  Patient presents with   Bradycardia   Hospitalization Follow-up    HPI  Connie Swanson is a 81 y.o. female whose past medical history and cardiovascular risk factors include: Intermittent complete heart block status post pacemaker implantation (10/2021), HFpEF, single-vessel CAD (nonobstructive), insulin-dependent diabetes mellitus type 2, chronic kidney disease, hypertension, history of ischemic colitis, hypothyroidism, obesity due to excess calories, vertigo, history of COVID-19 infection (January 2022), postmenopausal female, advanced age.  During her last office visit on October 17, 2021 patient had underlying rhythm of sinus bradycardia with complete heart block.  She was referred to the ED for further evaluation and management.  At that time her ventricular rate improved after cessation of carvedilol and she was sent home with a Zio patch.  She shortly returned thereafter due to near syncope and syncopal event and intermittent complete heart block.  She underwent pacemaker implantation and later discharged home.  She now presents for follow-up.  Clinically she is improved-no longer experiencing lightheaded and dizziness.  Her tiredness fatigue has improved but not to baseline.  During her hospitalization her Entresto 49/51 mg p.o. twice daily and Jardiance were held due to renal insufficiency.  She remains euvolemic on physical examination.  She is enrolled into remote patient monitoring.  Her average blood pressure is 133/69 with a pulse of 68.  Her weight averages to be 196 pounds.  FUNCTIONAL STATUS: No structured exercise program or daily routine.   ALLERGIES: Allergies  Allergen Reactions    Dapagliflozin Nausea And Vomiting    Farxiga    MEDICATION LIST PRIOR TO VISIT: Current Meds  Medication Sig   acetaminophen (TYLENOL) 325 MG tablet Take 2 tablets (650 mg total) by mouth every 4 (four) hours as needed for headache or mild pain.   allopurinol (ZYLOPRIM) 300 MG tablet Take 300 mg by mouth daily.   atorvastatin (LIPITOR) 40 MG tablet Take 40 mg by mouth daily.   B-D INS SYR ULTRAFINE 1CC/30G 30G X 1/2" 1 ML MISC USE SYRINGE AS DIRECTED TWICE DAILY   bumetanide (BUMEX) 0.5 MG tablet Take 1 tablet (0.5 mg total) by mouth every morning.   calcitRIOL (ROCALTROL) 0.25 MCG capsule Take 0.25 mcg by mouth every other day. Mon, Wed, Fri   Cholecalciferol (VITAMIN D3) 125 MCG (5000 UT) TABS Take 5,000 Units by mouth daily at 12 noon.   denosumab (PROLIA) 60 MG/ML SOSY injection Inject 60 mg into the skin every 6 (six) months.   hydrALAZINE (APRESOLINE) 50 MG tablet TAKE 1 TABLET BY MOUTH THREE TIMES DAILY (Patient taking differently: Take 50 mg by mouth 3 (three) times daily.)   isosorbide mononitrate (IMDUR) 30 MG 24 hr tablet Take 1/2 (one-half) tablet by mouth once daily (Patient taking differently: Take 30 mg by mouth daily.)   metoprolol succinate (TOPROL-XL) 50 MG 24 hr tablet Take 1 tablet (50 mg total) by mouth daily. Take with or immediately following a meal.     PAST MEDICAL HISTORY: Past Medical History:  Diagnosis Date   CHF (congestive heart failure) ([place])    Chronic kidney disease    Coronary artery disease    Diabetes mellitus without complication (HCC)    Hyperlipidemia    Hypertension  PAST SURGICAL HISTORY: Past Surgical History:  Procedure Laterality Date   BREAST EXCISIONAL BIOPSY Right    HEMORRHOID SURGERY     MENISCUS REPAIR     PACEMAKER IMPLANT N/A 10/24/2021   Procedure: PACEMAKER IMPLANT;  Surgeon: Vickie Epley, MD;  Location: [facility];  Service: Cardiovascular;  Laterality: N/A;   THYROID SURGERY     VAGINAL HYSTERECTOMY       FAMILY HISTORY: The patient family history is not on file. She was adopted.  SOCIAL HISTORY:  The patient  reports that she has never smoked. She has never used smokeless tobacco. She reports that she does not drink alcohol and does not use drugs.  REVIEW OF SYSTEMS: Review of Systems  Constitutional: Positive for malaise/fatigue (improved). Negative for chills and fever.  HENT:  Negative for hoarse voice and nosebleeds.   Eyes:  Negative for discharge, double vision and pain.  Cardiovascular:  Positive for dyspnea on exertion (chronic). Negative for chest pain, claudication, leg swelling, near-syncope, orthopnea, palpitations, paroxysmal nocturnal dyspnea and syncope.  Respiratory:  Positive for shortness of breath (chronic). Negative for hemoptysis.   Musculoskeletal:  Negative for muscle cramps and myalgias.  Gastrointestinal:  Negative for abdominal pain, constipation, diarrhea, hematemesis, hematochezia, melena, nausea and vomiting.  Neurological:  Negative for dizziness and light-headedness.   PHYSICAL EXAM:    11/01/2021    1:28 PM 10/25/2021    9:56 AM 10/25/2021    4:31 AM  Vitals with BMI  Height $Remov'5\' 2"'BmQPdY$     Weight 206 lbs 10 oz  198 lbs  BMI 25.42  70.62  Systolic 376 283 151  Diastolic 75 77 70  Pulse 73 85 94   CONSTITUTIONAL: Age-appropriate female, hemodynamically stable, no acute distress.    SKIN: Skin is warm and dry. No rash noted. No cyanosis. No pallor. No jaundice HEAD: Normocephalic and atraumatic.  EYES: No scleral icterus MOUTH/THROAT: Moist oral membranes.  NECK: Short neck, increased adipose tissue, no JVD present. No thyromegaly noted. No carotid bruits  CHEST Normal respiratory effort. No intercostal retractions.  Pacemaker noted in the left infraclavicular region.  Steri-Strips present.  No hematoma. LUNGS: Clear to auscultation bilaterally.  No stridor. No wheezes. No rales.  CARDIOVASCULAR: Regular, V6-H6, soft holosystolic murmur heard at the  apex, no gallops or rubs ABDOMINAL: Obese, nontender, nondistended, positive bowel sounds in all 4 quadrants, no apparent ascites.  EXTREMITIES: No peripheral edema,  no bilateral posterior tibial pulses. HEMATOLOGIC: No significant bruising NEUROLOGIC: Oriented to person, place, and time. Nonfocal. Normal muscle tone.  PSYCHIATRIC: Normal mood and affect. Normal behavior. Cooperative  RADIOLOGY:  VQ Scan:  04/28/2020: Negative perfusion lung scan for pulmonary embolism.  CARDIAC DATABASE: Pacemaker:  10/24/2021:  Dual chamber permanent pacemaker with left bundle area lead. [employer] Assurity MRI Implant 10/24/2021, by Dr. Quentin Ore.   EKG: 10/17/2021: Sinus bradycardia with complete heart block, 38 bpm, left axis, left anterior fascicular block, right bundle branch block, cannot rule out anterolateral ischemia. 11/01/2021: Normal sinus rhythm, 69 bpm, left axis, left bundle branch block, consider old inferior infarct.  Echocardiogram: 05/04/2020: Normal LV systolic function with visual EF 50-55%. Left ventricle cavity is normal in size. Severe concentric hypertrophy of the left ventricle. Normal global wall motion. Unable to evaluate diastolic function due to mitral annular calcification. Elevated LAP.  Native mitral valve. Posterior annular calcification. No evidence of mitral stenosis. Mild (Grade I) mitral regurgitation. Mild tricuspid regurgitation. No evidence of pulmonary hypertension. RVSP measures 32 mmHg. No prior studies  available for comparison.   Stress Testing: Lexiscan Tetrofosmin stress test 05/11/2020: Lexiscan nuclear stress test performed using 1-day protocol. SPECT images show uniform breast tissue attenuation in both rest and stress images. In addition, there is medium sized, medium intensity, fixed perfusion defect in basal inferior/inferoseptal myocardium. All segments of left ventricle demonstrated septal dyskinesis, global hypokinesis. Stress LVEF 34%. High risk  study due to low stress LVEF.  Heart Catheterization: 10/10/2010: HEMODYNAMIC DATA:  Right heart catheterization:  RA pressure 7/7, mean 6 mmHg.  RA saturation 76%.   RV pressure 94/8, end-diastolic pressure 4 mmHg.   PA pressure 27/40 with a mean of 21 mmHg.  PA saturation was 75%.   Pulmonary capillary wedge 6/5 with a mean of 3 mmHg.  Aortic saturation was 100%.   Cardiac output was 6.4 with a cardiac index of 3.34 by Fick.   Right coronary artery:  Right coronary artery is a large caliber and superdominant vessel.  Gives origin to large PL branch and 2 PDA large branches.  Smooth and normal.   Left main coronary artery:  Left main coronary artery is a large-caliber vessel, which is smooth and normal.   Circumflex coronary artery:  Circumflex coronary artery is a very large- caliber vessel.  Gives origin to a moderate-sized obtuse marginal 1 and continues distally as a large obtuse marginal 2.  Smooth and normal.   LAD:  LAD is a large-caliber vessel in the proximal segment.  Gives origin to a moderate-sized diagonal 1 and then gives origin to a very large diagonal 2, which is bigger than the LAD itself.  Between diagonal 1 and diagonal 2, there is a eccentric 50% stenosis noted in the LAD. The diagonal 2, which is very large has a ostial 30% stenosis. Otherwise, except for this focal stenosis of 50%, there is no other lesion noted in the LAD or the large diagonal 2.  LABORATORY DATA:    Latest Ref Rng & Units 10/24/2021    1:46 AM 10/24/2021    1:05 AM 10/20/2021    5:22 PM  CBC  WBC 4.0 - 10.5 K/uL 11.3  10.6  12.1   Hemoglobin 12.0 - 15.0 g/dL 10.9  10.5  10.7   Hematocrit 36.0 - 46.0 % 33.9  33.3  33.4   Platelets 150 - 400 K/uL 264  269  298        Latest Ref Rng & Units 10/25/2021    9:54 AM 10/24/2021    1:05 AM 10/20/2021    5:22 PM  CMP  Glucose 70 - 99 mg/dL 236  140  106   BUN 8 - 23 mg/dL $Remove'27  27  31   'yfQmpyS$ Creatinine 0.44 - 1.00 mg/dL 2.07  2.26  1.99    Sodium 135 - 145 mmol/L 139  137  138   Potassium 3.5 - 5.1 mmol/L 4.3  3.9  4.6   Chloride 98 - 111 mmol/L 107  104  104   CO2 22 - 32 mmol/L $RemoveB'23  26  25   'xRExJahS$ Calcium 8.9 - 10.3 mg/dL 9.3  9.1  9.9     Lipid Panel     Component Value Date/Time   CHOL 129 10/24/2021 0146   TRIG 155 (H) 10/24/2021 0146   HDL 25 (L) 10/24/2021 0146   CHOLHDL 5.2 10/24/2021 0146   VLDL 31 10/24/2021 0146   LDLCALC 73 10/24/2021 0146    No components found for: "NTPROBNP" Recent Labs    11/24/20 1313 01/12/21 1316 04/25/21  [place] 2,066* 1,948* 2,343*   Recent Labs    10/17/21 1540  TSH 2.542    BMP Recent Labs    10/20/21 1722 10/24/21 0105 10/25/21 0954  NA 138 137 139  K 4.6 3.9 4.3  CL 104 104 107  CO2 $Re'25 26 23  'pBQ$ GLUCOSE 106* 140* 236*  BUN 31* 27* 27*  CREATININE 1.99* 2.26* 2.07*  CALCIUM 9.9 9.1 9.3  GFRNONAA 24* 21* 23*    HEMOGLOBIN A1C No results found for: "HGBA1C", "MPG"  External labs: 09/07/2019:  Total cholesterol 147, triglycerides 186, HDL 27, LDL 83, non-HDL 120 TSH 2.15 Hemoglobin A1c 6.5 White count 11.75, hemoglobin 11.1 g/dL, hematocrit 34.3%  External Labs: Collected: 10/13/2021 provided by referring physician. NT proBNP 2450 EGFR 23.8 Sodium 143, potassium 4.7, chloride 104, bicarb 28 AST 11, ALT 10, alkaline phosphatase 60. BUN 26, creatinine 2 Hemoglobin 10.9 g/dL, hematocrit 31.5% Total cholesterol 134, triglycerides 119, HDL 27, LDL 83, non-HDL 107 TSH 3.03 A1c 6  IMPRESSION:    ICD-10-CM   1. Syncope, unspecified syncope type  R55 EKG 12-Lead    2. Symptomatic bradycardia  R00.1     3. Complete heart block (HCC)  I44.2     4. Chronic heart failure with preserved ejection fraction (HCC)  Z61.09 Basic metabolic panel    Magnesium    Pro b natriuretic peptide (BNP)    5. Type 2 diabetes mellitus with hyperglycemia, with long-term current use of insulin (HCC)  E11.65    Z79.4     6. Nonobstructive atherosclerosis of coronary  artery  I25.10     7. Mixed hyperlipidemia  E78.2     8. Benign hypertension  I10      RECOMMENDATIONS: Connie Swanson is a 81 y.o. female whose past medical history and cardiac risk factors include: Intermittent complete heart block status post pacemaker implantation (10/2021), HFpEF, single-vessel CAD (nonobstructive), insulin-dependent diabetes mellitus type 2, chronic kidney disease, hypertension, history of ischemic colitis, hypothyroidism, obesity due to excess calories, vertigo, history of COVID-19 infection (January 2022), postmenopausal female, advanced age.  Symptomatic bradycardia / Complete heart block (HCC) /Pacemaker in situ Patient was having symptomatic episodes of bradycardia with near syncope syncope. Underwent pacemaker implantation with Dr. Quentin Ore 10/24/2021. EKG shows sinus rhythm. Patient is scheduled to see [facility] for routine pacemaker follow-up for the first 3 months. She is doing well on current dose of metoprolol. Monitor for now  Chronic heart failure with preserved ejection fraction ([place]), stage B, NYHA class II/III Euvolemic. Given her renal function had discontinued Entresto 49/51 mg p.o. twice daily and Jardiance 10 mg p.o. daily. We will monitor renal function and BMP in 1 week to reevaluate when to reinitiate therapy. Medications reconciled. Appears to be euvolemic on physical examination. Currently enrolled into remote patient monitoring-data reviewed.  Type 2 diabetes mellitus with hyperglycemia, with long-term current use of insulin (HCC) GI importance of glycemic control. Continue statin therapy. We will reinitiate ARNI and Jardiance based on renal function.  Mixed hyperlipidemia Currently on atorvastatin. Does not endorse myalgias. Currently managed by primary care provider.  Benign hypertension Office blood pressures are now within range. Home blood pressures are better controlled with average 133/69. Given her underlying renal insufficiency  will avoid episodes of hypotension. Anticipate reinitiation of Entresto and Jardiance in the coming weeks/months.  FINAL MEDICATION LIST END OF ENCOUNTER: No orders of the defined types were placed in this encounter.    There are no discontinued medications.    Current  Outpatient Medications:    acetaminophen (TYLENOL) 325 MG tablet, Take 2 tablets (650 mg total) by mouth every 4 (four) hours as needed for headache or mild pain., Disp: , Rfl:    allopurinol (ZYLOPRIM) 300 MG tablet, Take 300 mg by mouth daily., Disp: , Rfl:    atorvastatin (LIPITOR) 40 MG tablet, Take 40 mg by mouth daily., Disp: , Rfl:    B-D INS SYR ULTRAFINE 1CC/30G 30G X 1/2" 1 ML MISC, USE SYRINGE AS DIRECTED TWICE DAILY, Disp: , Rfl:    bumetanide (BUMEX) 0.5 MG tablet, Take 1 tablet (0.5 mg total) by mouth every morning., Disp: 30 tablet, Rfl: 0   calcitRIOL (ROCALTROL) 0.25 MCG capsule, Take 0.25 mcg by mouth every other day. Mon, Wed, Fri, Disp: , Rfl:    Cholecalciferol (VITAMIN D3) 125 MCG (5000 UT) TABS, Take 5,000 Units by mouth daily at 12 noon., Disp: , Rfl:    denosumab (PROLIA) 60 MG/ML SOSY injection, Inject 60 mg into the skin every 6 (six) months., Disp: , Rfl:    hydrALAZINE (APRESOLINE) 50 MG tablet, TAKE 1 TABLET BY MOUTH THREE TIMES DAILY (Patient taking differently: Take 50 mg by mouth 3 (three) times daily.), Disp: 90 tablet, Rfl: 0   isosorbide mononitrate (IMDUR) 30 MG 24 hr tablet, Take 1/2 (one-half) tablet by mouth once daily (Patient taking differently: Take 30 mg by mouth daily.), Disp: 15 tablet, Rfl: 0   metoprolol succinate (TOPROL-XL) 50 MG 24 hr tablet, Take 1 tablet (50 mg total) by mouth daily. Take with or immediately following a meal., Disp: 30 tablet, Rfl: 11  Orders Placed This Encounter  Procedures   Basic metabolic panel   Magnesium   Pro b natriuretic peptide (BNP)   EKG 12-Lead    There are no Patient Instructions on file for this visit.   --Continue cardiac medications  as reconciled in final medication list. --Return in about 3 months (around 02/01/2022) for Follow up, heart failure management. PPM . Or sooner if needed. --Continue follow-up with your primary care physician regarding the management of your other chronic comorbid conditions.  Patient's questions and concerns were addressed to her satisfaction. She voices understanding of the instructions provided during this encounter.   This note was created using a voice recognition software as a result there may be grammatical errors inadvertently enclosed that do not reflect the nature of this encounter. Every attempt is made to correct such errors.   Rex Kras, [place], [facility]  Pager: [phone] Office: [phone]

## 2021-11-02 ENCOUNTER — Ambulatory Visit: Payer: Medicare Other | Admitting: Cardiology

## 2021-11-06 ENCOUNTER — Other Ambulatory Visit: Payer: Self-pay | Admitting: Cardiology

## 2021-11-06 ENCOUNTER — Other Ambulatory Visit: Payer: Self-pay

## 2021-11-06 DIAGNOSIS — I5032 Chronic diastolic (congestive) heart failure: Secondary | ICD-10-CM

## 2021-11-06 DIAGNOSIS — I10 Essential (primary) hypertension: Secondary | ICD-10-CM

## 2021-11-06 NOTE — Progress Notes (Unsigned)
Cardiology Office Note Date:  11/08/2021  Patient ID:  Connie Swanson, Connie Swanson 1940-03-24, MRN [id number] PCP:  Haywood Pao, MD  Cardiologist:  Dr. Terri Skains Electrophysiologist: Dr. Quentin Ore    Chief Complaint:  wound check  History of Present Illness: Connie Swanson is a 81 y.o. female with history of HFpEF, DM, HTN, hypothyroidism, obesity, vertigo, CAD (single vessel non-obstructive in review of Dr. Brennan Bailey note), CKD (IV),  CHB, PPM.  She was seen by Dr. Terri Skains 10/18/21 at a previously scheduled 6 mo  visit with c/o of unusual fatigue and lower BPs at home, she was bradycardic her EKG noting CHB, she was referred to the ER, home coreg held. Dr. Caryl Comes (and myself) saw her, planned to monitor her rhythm/rates off BB.  There was discussion on need for BB for her HFpEF, though felt that discontinuation was appropriate and ultimately she did have return of conduction and recommended home with live monitoring  She had a near syncopal epiosde at home, went tot he ER though ultimately left after a long wait. Zio was reviewed without noting any clear Avblock or significant bradycardia. That weekend though she did develop CHB with opusung and was called, denied symptoms, confirmed off her coreg. Hesitant to go to the ER though ultimately did.  Admitted 10/24/21, PPM implanted dame day and discharged 10/25/21, started on metoprolol.  She saw Dr. Terri Skains 11/01/21, doing well, noting her Entresto/jardiance held with some AKI, planned for labs and resuming meds when able  TODAY She is doing very well No recurrent weak, near syncope or syncopal spells. No CP, she has chronic mild DOE, no SOB otherwise.   Device information Abbott dual chamber PPM implanted 10/24/21 RV lead in LB area   Past Medical History:  Diagnosis Date   CHF (congestive heart failure) (HCC)    Chronic kidney disease    Coronary artery disease    Diabetes mellitus without complication ([place])    Hyperlipidemia     Hypertension     Past Surgical History:  Procedure Laterality Date   BREAST EXCISIONAL BIOPSY Right    HEMORRHOID SURGERY     MENISCUS REPAIR     PACEMAKER IMPLANT N/A 10/24/2021   Procedure: PACEMAKER IMPLANT;  Surgeon: Vickie Epley, MD;  Location: [facility];  Service: Cardiovascular;  Laterality: N/A;   THYROID SURGERY     VAGINAL HYSTERECTOMY      Current Outpatient Medications  Medication Sig Dispense Refill   acetaminophen (TYLENOL) 325 MG tablet Take 2 tablets (650 mg total) by mouth every 4 (four) hours as needed for headache or mild pain.     allopurinol (ZYLOPRIM) 300 MG tablet Take 300 mg by mouth daily.     atorvastatin (LIPITOR) 40 MG tablet Take 40 mg by mouth daily.     B-D INS SYR ULTRAFINE 1CC/30G 30G X 1/2" 1 ML MISC USE SYRINGE AS DIRECTED TWICE DAILY     bumetanide (BUMEX) 0.5 MG tablet Take 1 tablet by mouth once daily 30 tablet 0   calcitRIOL (ROCALTROL) 0.25 MCG capsule Take 0.25 mcg by mouth every other day. Mon, Wed, Fri     Cholecalciferol (VITAMIN D3) 125 MCG (5000 UT) TABS Take 5,000 Units by mouth daily at 12 noon.     denosumab (PROLIA) 60 MG/ML SOSY injection Inject 60 mg into the skin every 6 (six) months.     hydrALAZINE (APRESOLINE) 50 MG tablet TAKE 1 TABLET BY MOUTH THREE TIMES DAILY 90 tablet 3  isosorbide mononitrate (IMDUR) 30 MG 24 hr tablet Take 1/2 (one-half) tablet by mouth once daily 15 tablet 0   metoprolol succinate (TOPROL-XL) 50 MG 24 hr tablet Take 1 tablet (50 mg total) by mouth daily. Take with or immediately following a meal. 30 tablet 11   No current facility-administered medications for this visit.    Allergies:   Dapagliflozin   Social History:  The patient  reports that she has never smoked. She has never used smokeless tobacco. She reports that she does not drink alcohol and does not use drugs.   Family History:  The patient's family history is not on file. She was adopted.  ROS:  Please see the history of  present illness.    All other systems are reviewed and otherwise negative.   PHYSICAL EXAM:  VS:  BP (!) 170/80 (BP Location: Right Arm, Patient Position: Sitting, Cuff Size: Large)   Pulse 74   Ht 5\' 2"  (1.575 m)   Wt 204 lb (92.5 kg)   SpO2 92%   BMI 37.31 kg/m  BMI: Body mass index is 37.31 kg/m. Well nourished, well developed, in no acute distress HEENT: normocephalic, atraumatic Neck: no JVD, carotid bruits or masses Cardiac:  RRR; no significant murmurs, no rubs, or gallops Lungs:  CTA b/l, no wheezing, rhonchi or rales Abd: soft, nontender MS: no deformity or atrophy Ext: no edema Skin warm and dry, no rash Neuro:  No gross deficits appreciated Psych: euthymic mood, full affect  PPM site steri strips are removed without difficulty Wound edges are well approimated, no erythema, edema, or heat, no fluctuation or tenderness   EKG:  not done today  Device interrogation done today and reviewed by myself:  Battery and lead measurements are good No arrhythmias Acute outputs remain   10/19/21: TTE  1. LV gradient at rest approximately 8.5 mmHg and with Valsalva 13 mmHg.Marland Kitchen  Left ventricular ejection fraction, by estimation, is 60 to 65%. The left  ventricle has normal function. The left ventricle has no regional wall  motion abnormalities. moderate to  severe left ventricular hypertrophy. Diastolic function could not be  evaluated due to mitral annular calcification underlying rhythm.   2. Right ventricular systolic function is normal. The right ventricular  size is normal. There is mildly elevated pulmonary artery systolic  pressure. The estimated right ventricular systolic pressure is 84.6 mmHg.   3. Left atrial size was mildly dilated.   4. A small pericardial effusion is present. The pericardial effusion is  posterior to the left ventricle. There is no evidence of cardiac  tamponade.   5. The mitral valve is degenerative. No evidence of mitral valve  regurgitation.  Degree of mitral stenosis is present, difficult to quantify  given the underlying rhythm, fusion of E and A, and HR variability mitral  stenosis. Moderate to severe mitral  annular calcification.   6. The aortic valve is tricuspid. Aortic valve regurgitation is not  visualized. Aortic valve sclerosis is present, with no evidence of aortic  valve stenosis.   7. The inferior vena cava is normal in size with greater than 50%  respiratory variability, suggesting right atrial pressure of 3 mmHg.    Echocardiogram: 05/04/2020: Normal LV systolic function with visual EF 50-55%. Left ventricle cavity is normal in size. Severe concentric hypertrophy of the left ventricle. Normal global wall motion. Unable to evaluate diastolic function due to mitral annular calcification. Elevated LAP.  Native mitral valve. Posterior annular calcification. No evidence of mitral stenosis. Mild (  Grade I) mitral regurgitation. Mild tricuspid regurgitation. No evidence of pulmonary hypertension. RVSP measures 32 mmHg. No prior studies available for comparison.   Stress Testing: Lexiscan Tetrofosmin stress test 05/11/2020: Lexiscan nuclear stress test performed using 1-day protocol. SPECT images show uniform breast tissue attenuation in both rest and stress images. In addition, there is medium sized, medium intensity, fixed perfusion defect in basal inferior/inferoseptal myocardium. All segments of left ventricle demonstrated septal dyskinesis, global hypokinesis. Stress LVEF 34%. High risk study due to low stress LVEF.   Heart Catheterization: 10/10/2010: HEMODYNAMIC DATA:  Right heart catheterization:  RA pressure 7/7, mean 6 mmHg.  RA saturation 76%.   RV pressure 52/8, end-diastolic pressure 4 mmHg.   PA pressure 27/40 with a mean of 21 mmHg.  PA saturation was 75%.   Pulmonary capillary wedge 6/5 with a mean of 3 mmHg.  Aortic saturation was 100%.   Cardiac output was 6.4 with a cardiac index of 3.34 by  Fick.   Right coronary artery:  Right coronary artery is a large caliber and superdominant vessel.  Gives origin to large PL branch and 2 PDA large branches.  Smooth and normal.   Left main coronary artery:  Left main coronary artery is a large-caliber vessel, which is smooth and normal.   Circumflex coronary artery:  Circumflex coronary artery is a very large- caliber vessel.  Gives origin to a moderate-sized obtuse marginal 1 and continues distally as a large obtuse marginal 2.  Smooth and normal.   LAD:  LAD is a large-caliber vessel in the proximal segment.  Gives origin to a moderate-sized diagonal 1 and then gives origin to a very large diagonal 2, which is bigger than the LAD itself.  Between diagonal 1 and diagonal 2, there is a eccentric 50% stenosis noted in the LAD. The diagonal 2, which is very large has a ostial 30% stenosis. Otherwise, except for this focal stenosis of 50%, there is no other lesion noted in the LAD or the large diagonal 2.     Recent Labs: 04/25/2021: NT-Pro BNP 2,343 10/17/2021: ALT 9; TSH 2.542 10/24/2021: Hemoglobin 10.9; Platelets 264 10/25/2021: B Natriuretic Peptide 204.1; BUN 27; Creatinine, Ser 2.07; Magnesium 1.9; Potassium 4.3; Sodium 139  10/24/2021: Cholesterol 129; HDL 25; LDL Cholesterol 73; Total CHOL/HDL Ratio 5.2; Triglycerides 155; VLDL 31   Estimated Creatinine Clearance: 21.8 mL/min (A) (by C-G formula based on SCr of 2.07 mg/dL (H)).   Wt Readings from Last 3 Encounters:  11/08/21 204 lb (92.5 kg)  11/01/21 206 lb 9.6 oz (93.7 kg)  10/25/21 198 lb (89.8 kg)     Other studies reviewed: Additional studies/records reviewed today include: summarized above  ASSESSMENT AND PLAN:  PPM Healing well, no signs of infection Reviewed remaining wound care and activity restrictions  She is due for her annual mammogram.  I have advised that she wait until her visit with dr. Quentin Ore, make sure lead data is stable and system is well  healed  HFpEF Chronic CHF No symptoms or exam findings of volume OL C/w Dr. Terri Skains  HTN She reports much better of late at home, infrequently now > 150/80 C/w Dr. Terri Skains  Disposition: F/u with Dr. Quentin Ore as scheduled, sooner if needed  Current medicines are reviewed at length with the patient today.  The patient did not have any concerns regarding medicines.  Venetia Night, PA-C 11/08/2021 3:19 PM     [address] [place] [address] [phone] (office)  [phone]  [phone] (fax)

## 2021-11-07 ENCOUNTER — Other Ambulatory Visit (HOSPITAL_COMMUNITY): Payer: Self-pay | Admitting: *Deleted

## 2021-11-08 ENCOUNTER — Ambulatory Visit (HOSPITAL_COMMUNITY)
Admission: RE | Admit: 2021-11-08 | Discharge: 2021-11-08 | Disposition: A | Discharge status: Home / Self Care | Payer: Medicare Other | Source: Ambulatory Visit | Attending: Internal Medicine | Admitting: Internal Medicine

## 2021-11-08 ENCOUNTER — Ambulatory Visit: Payer: Medicare Other | Admitting: Physician Assistant

## 2021-11-08 ENCOUNTER — Other Ambulatory Visit: Payer: Self-pay | Admitting: Cardiology

## 2021-11-08 ENCOUNTER — Encounter: Payer: Self-pay | Admitting: Physician Assistant

## 2021-11-08 VITALS — BP 152/86 | HR 74 | Ht 62.0 in | Wt 204.0 lb

## 2021-11-08 DIAGNOSIS — Z5189 Encounter for other specified aftercare: Secondary | ICD-10-CM | POA: Insufficient documentation

## 2021-11-08 DIAGNOSIS — I10 Essential (primary) hypertension: Secondary | ICD-10-CM | POA: Insufficient documentation

## 2021-11-08 DIAGNOSIS — Z950 Presence of cardiac pacemaker: Secondary | ICD-10-CM

## 2021-11-08 DIAGNOSIS — I5032 Chronic diastolic (congestive) heart failure: Secondary | ICD-10-CM | POA: Insufficient documentation

## 2021-11-08 LAB — CUP PACEART INCLINIC DEVICE CHECK
Battery Remaining Longevity: 132 mo
Battery Voltage: 3.08 V
Brady Statistic RA Percent Paced: 0.65 %
Brady Statistic RV Percent Paced: 9.1 %
Date Time Interrogation Session: 20231101160846
Implantable Lead Connection Status: 753985
Implantable Lead Connection Status: 753985
Implantable Lead Implant Date: 20231017
Implantable Lead Implant Date: 20231017
Implantable Lead Location: 753859
Implantable Lead Location: 753860
Implantable Pulse Generator Implant Date: 20231017
Lead Channel Impedance Value: 400 Ohm
Lead Channel Impedance Value: 425 Ohm
Lead Channel Pacing Threshold Amplitude: 0.75 V
Lead Channel Pacing Threshold Amplitude: 0.75 V
Lead Channel Pacing Threshold Amplitude: 0.75 V
Lead Channel Pacing Threshold Amplitude: 0.75 V
Lead Channel Pacing Threshold Pulse Width: 0.5 ms
Lead Channel Pacing Threshold Pulse Width: 0.5 ms
Lead Channel Pacing Threshold Pulse Width: 0.5 ms
Lead Channel Pacing Threshold Pulse Width: 0.5 ms
Lead Channel Sensing Intrinsic Amplitude: 12 mV
Lead Channel Sensing Intrinsic Amplitude: 4.6 mV
Lead Channel Setting Pacing Amplitude: 3.5 V
Lead Channel Setting Pacing Amplitude: 3.5 V
Lead Channel Setting Pacing Pulse Width: 0.5 ms
Lead Channel Setting Sensing Sensitivity: 2 mV
Pulse Gen Model: 2272
Pulse Gen Serial Number: 8111834

## 2021-11-08 MED ORDER — DENOSUMAB 60 MG/ML ~~LOC~~ SOSY: 60.0000 mg | PREFILLED_SYRINGE | Freq: Once | SUBCUTANEOUS | Status: AC

## 2021-11-08 MED ORDER — DENOSUMAB 60 MG/ML ~~LOC~~ SOSY
PREFILLED_SYRINGE | SUBCUTANEOUS | Status: AC
  Administered 2021-11-08: 60 mg via SUBCUTANEOUS
  Filled 2021-11-08: qty 1

## 2021-11-08 NOTE — Addendum Note (Signed)
Encounter addended by: Gerarda Gunther on: 11/08/2021 1:55 PM  Actions taken: Imaging Exam ended

## 2021-11-08 NOTE — Patient Instructions (Addendum)
Medication Instructions:   Your physician recommends that you continue on your current medications as directed. Please refer to the Current Medication list given to you today.   *If you need a refill on your cardiac medications before your next appointment, please call your pharmacy*   Lab Work: [place]    If you have labs (blood work) drawn today and your tests are completely normal, you will receive your results only by: [place] (if you have MyChart) OR A paper copy in the mail If you have any lab test that is abnormal or we need to change your treatment, we will call you to review the results.   Testing/Procedures: NONE ORDERED  TODAY    Follow-Up: At [facility], you and your health needs are our priority.  As part of our continuing mission to provide you with exceptional heart care, we have created designated Provider Care Teams.  These Care Teams include your primary Cardiologist (physician) and Advanced Practice Providers (APPs -  Physician Assistants and Nurse Practitioners) who all work together to provide you with the care you need, when you need it.  We recommend signing up for the patient portal called "MyChart".  Sign up information is provided on this After Visit Summary.  MyChart is used to connect with patients for Virtual Visits (Telemedicine).  Patients are able to view lab/test results, encounter notes, upcoming appointments, etc.  Non-urgent messages can be sent to your provider as well.   To learn more about what you can do with MyChart, go to [email or web address].    Your next appointment:   2 month(s)  The format for your next appointment:   In Person  Provider:   You may see Dr.Lambert  or one of the following Advanced Practice Providers on your designated Care Team:     Other Instructions:   Important Information About Sugar

## 2021-11-09 ENCOUNTER — Other Ambulatory Visit: Payer: Self-pay

## 2021-11-09 DIAGNOSIS — I5032 Chronic diastolic (congestive) heart failure: Secondary | ICD-10-CM

## 2021-11-09 LAB — PRO B NATRIURETIC PEPTIDE: NT-Pro BNP: 10474 pg/mL — ABNORMAL HIGH (ref 0–738)

## 2021-11-09 LAB — BASIC METABOLIC PANEL
BUN/Creatinine Ratio: 16 (ref 12–28)
BUN: 27 mg/dL (ref 8–27)
CO2: 26 mmol/L (ref 20–29)
Calcium: 9.5 mg/dL (ref 8.7–10.3)
Chloride: 101 mmol/L (ref 96–106)
Creatinine, Ser: 1.72 mg/dL — ABNORMAL HIGH (ref 0.57–1.00)
Glucose: 101 mg/dL — ABNORMAL HIGH (ref 70–99)
Potassium: 4.7 mmol/L (ref 3.5–5.2)
Sodium: 140 mmol/L (ref 134–144)
eGFR: 29 mL/min/{1.73_m2} — ABNORMAL LOW (ref >59)

## 2021-11-09 LAB — MAGNESIUM: Magnesium: 2.3 mg/dL (ref 1.6–2.3)

## 2021-11-09 MED ORDER — EMPAGLIFLOZIN 10 MG PO TABS: 10.0000 mg | ORAL_TABLET | Freq: Every day | ORAL | 5 refills | Status: DC

## 2021-11-09 NOTE — Progress Notes (Signed)
Patient BNP elevated from last labs. Adding back Jardiance to regimen. eGFR (29) not yet at cut off for heart failure initiation (20mL/min/1.72m2). Labs in 1 week. 

## 2021-11-10 ENCOUNTER — Ambulatory Visit: Payer: Medicare Other | Admitting: Physician Assistant

## 2021-11-13 DIAGNOSIS — I5032 Chronic diastolic (congestive) heart failure: Secondary | ICD-10-CM | POA: Diagnosis not present

## 2021-11-17 DIAGNOSIS — I5032 Chronic diastolic (congestive) heart failure: Secondary | ICD-10-CM | POA: Diagnosis not present

## 2021-11-18 LAB — BASIC METABOLIC PANEL
BUN/Creatinine Ratio: 15 (ref 12–28)
BUN: 32 mg/dL — ABNORMAL HIGH (ref 8–27)
CO2: 24 mmol/L (ref 20–29)
Calcium: 8.2 mg/dL — ABNORMAL LOW (ref 8.7–10.3)
Chloride: 106 mmol/L (ref 96–106)
Creatinine, Ser: 2.14 mg/dL — ABNORMAL HIGH (ref 0.57–1.00)
Glucose: 162 mg/dL — ABNORMAL HIGH (ref 70–99)
Potassium: 5.3 mmol/L — ABNORMAL HIGH (ref 3.5–5.2)
Sodium: 143 mmol/L (ref 134–144)
eGFR: 22 mL/min/{1.73_m2} — ABNORMAL LOW (ref >59)

## 2021-11-18 LAB — MAGNESIUM: Magnesium: 2.7 mg/dL — ABNORMAL HIGH (ref 1.6–2.3)

## 2021-11-18 LAB — PRO B NATRIURETIC PEPTIDE: NT-Pro BNP: 4884 pg/mL — ABNORMAL HIGH (ref 0–738)

## 2021-11-21 ENCOUNTER — Other Ambulatory Visit: Payer: Self-pay

## 2021-11-21 DIAGNOSIS — I5032 Chronic diastolic (congestive) heart failure: Secondary | ICD-10-CM

## 2021-11-21 DIAGNOSIS — I130 Hypertensive heart and chronic kidney disease with heart failure and stage 1 through stage 4 chronic kidney disease, or unspecified chronic kidney disease: Secondary | ICD-10-CM

## 2021-11-21 DIAGNOSIS — E875 Hyperkalemia: Secondary | ICD-10-CM

## 2021-11-21 MED ORDER — VELTASSA 8.4 G PO PACK: 8.4000 g | PACK | Freq: Every day | ORAL | 0 refills | Status: DC

## 2021-11-21 NOTE — Progress Notes (Signed)
Patient will stop taking the Bumex and continue all other medications. Patient confirmed she is not taking potassium supplements. Spoke with patient about starting Veltassa. Patient knows to get labs 1 week after starting the Veltassa  Meds ordered this encounter  Medications   patiromer (VELTASSA) 8.4 g packet    Sig: Take 1 packet (8.4 g total) by mouth daily.    Dispense:  30 each    Refill:  0   Medications Discontinued During This Encounter  Medication Reason   bumetanide (BUMEX) 0.5 MG tablet Discontinued by provider

## 2021-11-27 ENCOUNTER — Ambulatory Visit: Payer: Self-pay

## 2021-11-27 NOTE — Patient Outreach (Signed)
  Care Coordination   Follow Up Visit Note   11/27/2021 Name: ANNAKATE SOULIER MRN: [id number] DOB: Dec 16, 1940  KAILEA DANNEMILLER is a 81 y.o. year old female who sees [name], [name], MD for primary care. I spoke with  [name] by phone today.  What matters to the patients health and wellness today?  Patient is experiencing increased constipation since starting Veltassa.     Goals Addressed               This Visit's Progress     Patient Stated     I am checking my weights daily (pt-stated)        Care Coordination Interventions: Evaluation of current treatment plan related to CHF and patient's adherence to plan as established by provider Review of patient status, including review of consultant's reports, relevant laboratory and other test results, and medications completed Determined patient is experiencing worsening constipation since starting Veltassa  Reviewed potential SE related to this medication including constipation Educated patient regarding basic disease process related to Constipation and how to self manage, patient has tried all of these interventions, including resuming Miralax as directed by Cardiologist Educated patient about Linzess, sent in basket message to [facility] working with Dr. Terri Skains requesting she contact patient to consider for start of Linzess           SDOH assessments and interventions completed:  No     Care Coordination Interventions Activated:  Yes  Care Coordination Interventions:  Yes, provided   Follow up plan: Follow up call scheduled for 02/09/22 @0930  AM    Encounter Outcome:  Pt. Visit Completed

## 2021-11-27 NOTE — Patient Instructions (Signed)
Visit Information  Thank you for taking time to visit with me today. Please don't hesitate to contact me if I can be of assistance to you.   Following are the goals we discussed today:   Goals Addressed               This Visit's Progress     Patient Stated     I am checking my weights daily (pt-stated)        Care Coordination Interventions: Evaluation of current treatment plan related to CHF and patient's adherence to plan as established by provider Review of patient status, including review of consultant's reports, relevant laboratory and other test results, and medications completed Determined patient is experiencing worsening constipation since starting Veltassa  Reviewed potential SE related to this medication including constipation Educated patient regarding basic disease process related to Constipation and how to self manage, patient has tried all of these interventions, including resuming Miralax as directed by Cardiologist Educated patient about Linzess, sent in basket message to [facility] working with Dr. Terri Skains requesting she contact patient to consider for start of [place] next appointment is by telephone on 02/09/22 at 0930AM  Please call the care guide team at [phone] if you need to cancel or reschedule your appointment.   If you are experiencing a Mental Health or [place] or need someone to talk to, please call 1-800-273-TALK (toll free, 24 hour hotline)  The patient verbalized understanding of instructions, educational materials, and care plan provided today and agreed to receive a mailed copy of patient instructions, educational materials, and care plan.   Barb Merino, RN, BSN, CCM Care Management Coordinator [facility] Care Management  Direct Phone: [phone]

## 2021-11-28 ENCOUNTER — Ambulatory Visit: Payer: Medicare Other | Admitting: Podiatry

## 2021-11-28 ENCOUNTER — Other Ambulatory Visit: Payer: Self-pay

## 2021-11-28 ENCOUNTER — Encounter: Payer: Self-pay | Admitting: Podiatry

## 2021-11-28 DIAGNOSIS — M79674 Pain in right toe(s): Secondary | ICD-10-CM

## 2021-11-28 DIAGNOSIS — B351 Tinea unguium: Secondary | ICD-10-CM | POA: Diagnosis not present

## 2021-11-28 DIAGNOSIS — M79675 Pain in left toe(s): Secondary | ICD-10-CM | POA: Diagnosis not present

## 2021-11-28 DIAGNOSIS — E119 Type 2 diabetes mellitus without complications: Secondary | ICD-10-CM

## 2021-11-28 NOTE — Progress Notes (Signed)
This patient returns to my office for at risk foot care.  This patient requires this care by a professional since this patient will be at risk due to having diabetes and chronic kidney disease   This patient is unable to cut nails herself since the patient cannot reach her nails.These nails are painful walking and wearing shoes.  This patient presents for at risk foot care today.  General Appearance  Alert, conversant and in no acute stress.  Vascular  Dorsalis pedis and posterior tibial  pulses are palpable  bilaterally.  Capillary return is within normal limits  bilaterally. Temperature is within normal limits  bilaterally.  Neurologic  Senn-Weinstein monofilament wire test within normal limits  bilaterally. Muscle power within normal limits bilaterally.  Nails Thick disfigured discolored nails with subungual debris  Hallux nails  bilaterally. No evidence of bacterial infection or drainage bilaterally.  Orthopedic  No limitations of motion  feet .  No crepitus or effusions noted.  No bony pathology or digital deformities noted.Mild  HAV  B/L.  Skin  normotropic skin with no porokeratosis noted bilaterally.  No signs of infections or ulcers noted.     Onychomycosis  Pain in right toes  Pain in left toes  Consent was obtained for treatment procedures.   Mechanical debridement of nails 1-5  bilaterally performed with a nail nipper.  Filed with dremel without incident.    Return office visit  10 weeks                   Told patient to return for periodic foot care and evaluation due to potential at risk complications.   Gardiner Barefoot DPM

## 2021-11-29 ENCOUNTER — Encounter: Payer: Self-pay | Admitting: Cardiology

## 2021-11-29 DIAGNOSIS — Z950 Presence of cardiac pacemaker: Secondary | ICD-10-CM | POA: Insufficient documentation

## 2021-11-29 HISTORY — DX: Presence of cardiac pacemaker: Z95.0

## 2021-12-09 ENCOUNTER — Encounter: Payer: Self-pay | Admitting: Cardiology

## 2021-12-09 DIAGNOSIS — Z01810 Encounter for preprocedural cardiovascular examination: Secondary | ICD-10-CM | POA: Insufficient documentation

## 2021-12-09 DIAGNOSIS — Z45018 Encounter for adjustment and management of other part of cardiac pacemaker: Secondary | ICD-10-CM | POA: Insufficient documentation

## 2021-12-12 ENCOUNTER — Ambulatory Visit: Payer: Self-pay

## 2021-12-12 NOTE — Patient Outreach (Addendum)
  Care Coordination   Follow Up Visit Note   12/12/2021 Name: Connie Swanson MRN: [id number] DOB: 1940-10-07  Connie Swanson is a 81 y.o. year old female who sees [name], [name], MD for primary care. I spoke with  Connie Swanson by phone today.  What matters to the patients health and wellness today?  Patient will start [place] for chronic constipation.     Goals Addressed               This Visit's Progress     Patient Stated     I will be starting Linzess (pt-stated)        Care Coordination Interventions: Evaluation of current treatment plan related to chronic constipation  and patient's adherence to plan as established by provider Reviewed medications with patient and discussed patient's Cardiology d/c her Veltassa Discussed and reviewed patient was prescribed Linzess and this medication was approved by her Cardiologist and is ready to pick up and start Educated patient regarding indication, dosage and frequency of this medication  Determined patient need assistance with cost but states she will be able to pay $37 today in order to get the Rx picked up and started Sent in basket message to [facility] working with Dr. Terri Skains requesting she contact patient to offer resources to help lower the cost of Linzess if available     I am checking my weights daily (pt-stated)     Care Coordination Interventions: Evaluation of current treatment plan related to CHF and patient's adherence to plan as established by provider Determined patient continues to experience intermittent shortness of breath, worse with ambulation and activity of daily living Educated patient regarding the basic disease process related to her cardiopulmonary disease process Educated patient about the benefits of using deep breathing exercises  Provided verbal and written instructions on how to perform deep breathing exercises Educated patient about the benefits of cardio-pulmonary rehabilitation and encourage  patient to discuss this with her Cardiologist at next scheduled visit Reviewed upcoming scheduled Cardiology follow up scheduled for 02/06/22 @1130  AM      SDOH assessments and interventions completed:  No     Care Coordination Interventions:  Yes, provided   Follow up plan: Follow up call scheduled for 02/09/22 @0930  AM    Encounter Outcome:  Pt. Visit Completed

## 2021-12-12 NOTE — Patient Instructions (Addendum)
Visit Information  Thank you for taking time to visit with me today. Please don't hesitate to contact me if I can be of assistance to you.   Following are the goals we discussed today:   Goals Addressed               This Visit's Progress     Patient Stated     I am checking my weights daily (pt-stated)        Care Coordination Interventions: Evaluation of current treatment plan related to CHF and patient's adherence to plan as established by provider Determined patient continues to experience intermittent shortness of breath, worse with ambulation and activity of daily living Educated patient regarding the basic disease process related to her cardiopulmonary disease process Educated patient about the benefits of using deep breathing exercises  Provided verbal and written instructions on how to perform deep breathing exercises Educated patient about the benefits of cardio-pulmonary rehabilitation and encourage patient to discuss this with her Cardiologist at next scheduled visit Reviewed upcoming scheduled Cardiology follow up scheduled for 02/06/22 @1130  AM       I will be starting Linzess (pt-stated)        Care Coordination Interventions: Evaluation of current treatment plan related to chronic constipation  and patient's adherence to plan as established by provider Reviewed medications with patient and discussed patient's Cardiology d/c her Veltassa Discussed and reviewed patient was prescribed Linzess and this medication was approved by her Cardiologist and is ready to pick up and start Educated patient regarding indication, dosage and frequency of this medication  Determined patient need assistance with cost but states she will be able to pay $37 today in order to get the Rx picked up and started Sent in basket message to [facility] working with Dr. Terri Skains requesting she contact patient to offer resources to help lower the cost of Linzess if available             Our next appointment is by telephone on 02/09/22 at 0930 AM  Please call the care guide team at [phone] if you need to cancel or reschedule your appointment.   If you are experiencing a Mental Health or [place] or need someone to talk to, please call 1-800-273-TALK (toll free, 24 hour hotline)  The patient verbalized understanding of instructions, educational materials, and care plan provided today and agreed to receive a mailed copy of patient instructions, educational materials, and care plan.   Barb Merino, RN, BSN, CCM Care Management Coordinator [facility] Care Management Direct Phone: [phone]

## 2021-12-13 DIAGNOSIS — I5032 Chronic diastolic (congestive) heart failure: Secondary | ICD-10-CM | POA: Diagnosis not present

## 2021-12-14 DIAGNOSIS — I5032 Chronic diastolic (congestive) heart failure: Secondary | ICD-10-CM | POA: Diagnosis not present

## 2021-12-14 DIAGNOSIS — E875 Hyperkalemia: Secondary | ICD-10-CM | POA: Diagnosis not present

## 2021-12-14 DIAGNOSIS — N184 Chronic kidney disease, stage 4 (severe): Secondary | ICD-10-CM | POA: Diagnosis not present

## 2021-12-14 DIAGNOSIS — I130 Hypertensive heart and chronic kidney disease with heart failure and stage 1 through stage 4 chronic kidney disease, or unspecified chronic kidney disease: Secondary | ICD-10-CM | POA: Diagnosis not present

## 2021-12-15 LAB — BASIC METABOLIC PANEL
BUN/Creatinine Ratio: 11 — ABNORMAL LOW (ref 12–28)
BUN: 18 mg/dL (ref 8–27)
CO2: 23 mmol/L (ref 20–29)
Calcium: 9.5 mg/dL (ref 8.7–10.3)
Chloride: 107 mmol/L — ABNORMAL HIGH (ref 96–106)
Creatinine, Ser: 1.64 mg/dL — ABNORMAL HIGH (ref 0.57–1.00)
Glucose: 106 mg/dL — ABNORMAL HIGH (ref 70–99)
Potassium: 5.3 mmol/L — ABNORMAL HIGH (ref 3.5–5.2)
Sodium: 143 mmol/L (ref 134–144)
eGFR: 31 mL/min/{1.73_m2} — ABNORMAL LOW (ref >59)

## 2021-12-15 LAB — PRO B NATRIURETIC PEPTIDE: NT-Pro BNP: 7500 pg/mL — ABNORMAL HIGH (ref 0–738)

## 2021-12-15 LAB — MAGNESIUM: Magnesium: 2.3 mg/dL (ref 1.6–2.3)

## 2021-12-18 ENCOUNTER — Other Ambulatory Visit: Payer: Self-pay | Admitting: Cardiology

## 2021-12-18 DIAGNOSIS — I5032 Chronic diastolic (congestive) heart failure: Secondary | ICD-10-CM

## 2021-12-18 DIAGNOSIS — I10 Essential (primary) hypertension: Secondary | ICD-10-CM

## 2021-12-27 ENCOUNTER — Other Ambulatory Visit: Payer: Self-pay

## 2021-12-27 DIAGNOSIS — K5900 Constipation, unspecified: Secondary | ICD-10-CM

## 2021-12-27 DIAGNOSIS — I5032 Chronic diastolic (congestive) heart failure: Secondary | ICD-10-CM

## 2021-12-27 MED ORDER — KP MAG-OXIDE MAGNESIUM 200 MG PO TABS: 200.0000 mg | ORAL_TABLET | Freq: Three times a day (TID) | ORAL | 1 refills | Status: AC

## 2021-12-27 NOTE — Progress Notes (Signed)
Follow-up labs for 1 week after starting Entresto 24-26mg  BID.

## 2021-12-27 NOTE — Progress Notes (Signed)
Per discussion with Dr. Terri Skains - patient to continue taking magnesium at dose 200mg  TID to help with her constipation.

## 2021-12-29 DIAGNOSIS — I5032 Chronic diastolic (congestive) heart failure: Secondary | ICD-10-CM | POA: Diagnosis not present

## 2021-12-30 LAB — BASIC METABOLIC PANEL
BUN/Creatinine Ratio: 12 (ref 12–28)
BUN: 22 mg/dL (ref 8–27)
CO2: 22 mmol/L (ref 20–29)
Calcium: 8.7 mg/dL (ref 8.7–10.3)
Chloride: 109 mmol/L — ABNORMAL HIGH (ref 96–106)
Creatinine, Ser: 1.84 mg/dL — ABNORMAL HIGH (ref 0.57–1.00)
Glucose: 112 mg/dL — ABNORMAL HIGH (ref 70–99)
Potassium: 4.9 mmol/L (ref 3.5–5.2)
Sodium: 144 mmol/L (ref 134–144)
eGFR: 27 mL/min/{1.73_m2} — ABNORMAL LOW (ref >59)

## 2021-12-30 LAB — MAGNESIUM: Magnesium: 2.5 mg/dL — ABNORMAL HIGH (ref 1.6–2.3)

## 2021-12-30 LAB — PRO B NATRIURETIC PEPTIDE: NT-Pro BNP: 3999 pg/mL — ABNORMAL HIGH (ref 0–738)

## 2022-01-13 DIAGNOSIS — I5032 Chronic diastolic (congestive) heart failure: Secondary | ICD-10-CM | POA: Diagnosis not present

## 2022-01-16 DIAGNOSIS — I130 Hypertensive heart and chronic kidney disease with heart failure and stage 1 through stage 4 chronic kidney disease, or unspecified chronic kidney disease: Secondary | ICD-10-CM | POA: Diagnosis not present

## 2022-01-16 DIAGNOSIS — E1129 Type 2 diabetes mellitus with other diabetic kidney complication: Secondary | ICD-10-CM | POA: Diagnosis not present

## 2022-01-16 DIAGNOSIS — E039 Hypothyroidism, unspecified: Secondary | ICD-10-CM | POA: Diagnosis not present

## 2022-01-25 ENCOUNTER — Telehealth: Payer: Self-pay

## 2022-01-25 DIAGNOSIS — N1832 Chronic kidney disease, stage 3b: Secondary | ICD-10-CM | POA: Diagnosis not present

## 2022-01-26 NOTE — Telephone Encounter (Signed)
Patient left this message on VM stating this is  [facility] my telephone number is [phone] somebody from there call me I'm a couple hours ago now I said doctor told you wanted me to come in. on the 23rd but she was going to check because I also have an appoint with Dr Quentin Ore on the 24th and she didn't think I was supposed to go to the doctor Claudie Revering so she was going to call me back in just a few minutes but I had never heard back from her so I was just wondering if I'm supposed to come in to see Dr total you. or what I'm supposed to do just let me know thank you.   I have tried calling patient but no answer. LVMTCB

## 2022-01-26 NOTE — Telephone Encounter (Signed)
This was taken care of yesterday. Patient had not checked her VM, until after calling here.

## 2022-01-30 ENCOUNTER — Other Ambulatory Visit: Payer: Self-pay

## 2022-01-30 DIAGNOSIS — I5032 Chronic diastolic (congestive) heart failure: Secondary | ICD-10-CM

## 2022-01-30 NOTE — Progress Notes (Signed)
Repeat labs for 2/13 visit. Patient aware she needs to get lab work done

## 2022-01-31 ENCOUNTER — Encounter: Payer: Self-pay | Admitting: Cardiology

## 2022-01-31 ENCOUNTER — Ambulatory Visit: Payer: Medicare Other | Attending: Cardiology | Admitting: Cardiology

## 2022-01-31 VITALS — BP 128/80 | HR 81 | Ht 62.0 in | Wt 195.0 lb

## 2022-01-31 DIAGNOSIS — R001 Bradycardia, unspecified: Secondary | ICD-10-CM

## 2022-01-31 DIAGNOSIS — Z950 Presence of cardiac pacemaker: Secondary | ICD-10-CM | POA: Diagnosis not present

## 2022-01-31 DIAGNOSIS — I10 Essential (primary) hypertension: Secondary | ICD-10-CM

## 2022-01-31 NOTE — Progress Notes (Deleted)
Electrophysiology Office Follow up Visit Note:    Date:  01/31/2022   ID:  Natividad, Schlosser May 04, 1940, MRN [id number]  PCP:  Haywood Pao, MD  [facility] Cardiologist:  None  [facility] Electrophysiologist:  None    Interval History:    Connie Swanson is a 82 y.o. female who presents for a follow up visit.  She had a pacemaker implanted October 24, 2021 for intermittent complete heart block.       Past Medical History:  Diagnosis Date   CHF (congestive heart failure) (HCC)    Chronic kidney disease    Complete heart block ([place])    Coronary artery disease    Diabetes mellitus without complication ([place])    Hyperlipidemia    Hypertension    Pacemaker Medtronic dual chamber Pacemaker Assurity Dr-Rf 10/24/2021 11/29/2021    Past Surgical History:  Procedure Laterality Date   BREAST EXCISIONAL BIOPSY Right    HEMORRHOID SURGERY     MENISCUS REPAIR     PACEMAKER IMPLANT N/A 10/24/2021   Procedure: PACEMAKER IMPLANT;  Surgeon: Vickie Epley, MD;  Location: [place] [facility];  Service: Cardiovascular;  Laterality: N/A;   THYROID SURGERY     VAGINAL HYSTERECTOMY      Current Medications: No outpatient medications have been marked as taking for the 01/31/22 encounter (Appointment) with Vickie Epley, MD.     Allergies:   Dapagliflozin   Social History   Socioeconomic History   Marital status: Widowed    Spouse name: Not on file   Number of children: 4   Years of education: Not on file   Highest education level: Not on file  Occupational History   Not on file  Tobacco Use   Smoking status: Never   Smokeless tobacco: Never  Vaping Use   Vaping Use: Never used  Substance and Sexual Activity   Alcohol use: Never   Drug use: Never   Sexual activity: Not on file  Other Topics Concern   Not on file  Social History Narrative   Not on file   Social Determinants of Health   Financial Resource Strain: Not on file  Food Insecurity: No Food  Insecurity (10/18/2021)   Hunger Vital Sign    Worried About Running Out of Food in the Last Year: Never true    Ran Out of Food in the Last Year: Never true  Transportation Needs: No Transportation Needs (10/27/2021)   PRAPARE - Hydrologist (Medical): No    Lack of Transportation (Non-Medical): No  Physical Activity: Not on file  Stress: Not on file  Social Connections: Not on file     Family History: The patient's family history is not on file. She was adopted.  ROS:   Please see the history of present illness.    All other systems reviewed and are negative.  EKGs/Labs/Other Studies Reviewed:    The following studies were reviewed today:  January 31, 2022 in clinic device interrogation personally reviewed ***  EKG:  The ekg ordered today demonstrates ***  Recent Labs: 10/17/2021: ALT 9; TSH 2.542 10/24/2021: Hemoglobin 10.9; Platelets 264 10/25/2021: B Natriuretic Peptide 204.1 12/29/2021: BUN 22; Creatinine, Ser 1.84; Magnesium 2.5; NT-Pro BNP 3,999; Potassium 4.9; Sodium 144  Recent Lipid Panel    Component Value Date/Time   CHOL 129 10/24/2021 0146   TRIG 155 (H) 10/24/2021 0146   HDL 25 (L) 10/24/2021 0146   CHOLHDL 5.2 10/24/2021 0146  VLDL 31 10/24/2021 0146   LDLCALC 73 10/24/2021 0146    Physical Exam:    VS:  There were no vitals taken for this visit.    Wt Readings from Last 3 Encounters:  11/08/21 204 lb (92.5 kg)  11/01/21 206 lb 9.6 oz (93.7 kg)  10/25/21 198 lb (89.8 kg)     GEN: *** Well nourished, well developed in no acute distress CARDIAC: ***RRR, no murmurs, rubs, gallops       ASSESSMENT:    No diagnosis found. PLAN:    In order of problems listed above:  #Symptomatic bradycardia #Intermittent complete heart block #Pacemaker in situ Device functioning appropriately.  Continue remote monitoring.  #Hypertension *** goal today.  Recommend checking blood pressures 1-2 times per week at home and  recording the values.  Recommend bringing these recordings to the primary care physician.   Follow-up 1 year with APP.  Medication Adjustments/Labs and Tests Ordered: Current medicines are reviewed at length with the patient today.  Concerns regarding medicines are outlined above.  No orders of the defined types were placed in this encounter.  No orders of the defined types were placed in this encounter.    Signed, Lars Mage, MD, [facility], [facility] 01/31/2022 5:47 AM    Electrophysiology [facility]

## 2022-01-31 NOTE — Patient Instructions (Signed)
Medication Instructions:  Your physician recommends that you continue on your current medications as directed. Please refer to the Current Medication list given to you today.  *If you need a refill on your cardiac medications before your next appointment, please call your pharmacy*  Follow-Up: At [facility], you and your health needs are our priority.  As part of our continuing mission to provide you with exceptional heart care, we have created designated Provider Care Teams.  These Care Teams include your primary Cardiologist (physician) and Advanced Practice Providers (APPs -  Physician Assistants and Nurse Practitioners) who all work together to provide you with the care you need, when you need it.  Your next appointment:   1 year(s)  Provider:   You will see one of the following Advanced Practice Providers on your designated Care Team:   Renee Ursuy, PA-C Michael "Andy" Tillery, PA-C Suzann Riddle, NP  

## 2022-01-31 NOTE — Progress Notes (Signed)
Electrophysiology Office Follow up Visit Note:    Date:  01/31/2022   ID:  Connie, Swanson 23-Mar-1940, MRN [id number]  PCP:  Haywood Pao, MD  [facility] Cardiologist:  None  [facility] Electrophysiologist:  Vickie Epley, MD    Interval History:    Connie Swanson is a 82 y.o. female who presents for a follow up visit.  She had a pacemaker implanted October 24, 2021 for intermittent complete heart block.  Today, she complains of feeling short of breath often. She also endorses LE weakness as she is not able to stand on her feet for long periods. However she denies any leg pain.  Sometimes she will experience leg swelling and/or heavy dyspnea at night. These symptoms will resolve after taking her diuretic, which she needs to take about once a week. She monitors her weight fairly often, but not daily. At home she was 191 lbs most recently.  She denies any palpitations, chest pain, lightheadedness, headaches, syncope, or PND.      Past Medical History:  Diagnosis Date   CHF (congestive heart failure) (HCC)    Chronic kidney disease    Complete heart block ([place])    Coronary artery disease    Diabetes mellitus without complication ([place])    Hyperlipidemia    Hypertension    Pacemaker Medtronic dual chamber Pacemaker Assurity Dr-Rf 10/24/2021 11/29/2021    Past Surgical History:  Procedure Laterality Date   BREAST EXCISIONAL BIOPSY Right    HEMORRHOID SURGERY     MENISCUS REPAIR     PACEMAKER IMPLANT N/A 10/24/2021   Procedure: PACEMAKER IMPLANT;  Surgeon: Vickie Epley, MD;  Location: [place] [facility];  Service: Cardiovascular;  Laterality: N/A;   THYROID SURGERY     VAGINAL HYSTERECTOMY      Current Medications: Current Meds  Medication Sig   acetaminophen (TYLENOL) 325 MG tablet Take 2 tablets (650 mg total) by mouth every 4 (four) hours as needed for headache or mild pain.   allopurinol (ZYLOPRIM) 300 MG tablet Take 300 mg by mouth daily.    atorvastatin (LIPITOR) 40 MG tablet Take 40 mg by mouth daily.   B-D INS SYR ULTRAFINE 1CC/30G 30G X 1/2" 1 ML MISC USE SYRINGE AS DIRECTED TWICE DAILY   calcitRIOL (ROCALTROL) 0.25 MCG capsule Take 0.25 mcg by mouth every other day. Mon, Wed, Fri   Cholecalciferol (VITAMIN D3) 125 MCG (5000 UT) TABS Take 5,000 Units by mouth daily at 12 noon.   denosumab (PROLIA) 60 MG/ML SOSY injection Inject 60 mg into the skin every 6 (six) months.   empagliflozin (JARDIANCE) 10 MG TABS tablet Take 1 tablet (10 mg total) by mouth daily before breakfast.   hydrALAZINE (APRESOLINE) 50 MG tablet TAKE 1 TABLET BY MOUTH THREE TIMES DAILY   isosorbide mononitrate (IMDUR) 30 MG 24 hr tablet Take 1/2 (one-half) tablet by mouth once daily   Magnesium Oxide -Mg Supplement (KP MAG-OXIDE MAGNESIUM) 200 MG TABS Take 1 tablet (200 mg total) by mouth in the morning, at noon, and at bedtime.   metoprolol succinate (TOPROL-XL) 50 MG 24 hr tablet Take 1 tablet (50 mg total) by mouth daily. Take with or immediately following a meal.     Allergies:   Dapagliflozin   Social History   Socioeconomic History   Marital status: Widowed    Spouse name: Not on file   Number of children: 4   Years of education: Not on file   Highest education level:  Not on file  Occupational History   Not on file  Tobacco Use   Smoking status: Never   Smokeless tobacco: Never  Vaping Use   Vaping Use: Never used  Substance and Sexual Activity   Alcohol use: Never   Drug use: Never   Sexual activity: Not on file  Other Topics Concern   Not on file  Social History Narrative   Not on file   Social Determinants of Health   Financial Resource Strain: Not on file  Food Insecurity: No Food Insecurity (10/18/2021)   Hunger Vital Sign    Worried About Running Out of Food in the Last Year: Never true    Ran Out of Food in the Last Year: Never true  Transportation Needs: No Transportation Needs (10/27/2021)   PRAPARE - Armed forces logistics/support/administrative officer (Medical): No    Lack of Transportation (Non-Medical): No  Physical Activity: Not on file  Stress: Not on file  Social Connections: Not on file     Family History: The patient's family history is not on file. She was adopted.  ROS:   Please see the history of present illness.    (+) Shortness of breath (+) LE weakness (+) LE edema All other systems reviewed and are negative.  EKGs/Labs/Other Studies Reviewed:    The following studies were reviewed today:  January 31, 2022 in clinic device interrogation personally reviewed Battery longevity 8.6 years Programmed DDD 60-1 20 Lead parameter stable 99% ventricular pacing Atrial pacing 2.6% 0% A-fib burden  EKG:  The ekg ordered today demonstrates atrial sensed, ventricular paced rhythm with prolonged AV conduction  Recent Labs: 10/17/2021: ALT 9; TSH 2.542 10/24/2021: Hemoglobin 10.9; Platelets 264 10/25/2021: B Natriuretic Peptide 204.1 12/29/2021: BUN 22; Creatinine, Ser 1.84; Magnesium 2.5; NT-Pro BNP 3,999; Potassium 4.9; Sodium 144   Recent Lipid Panel    Component Value Date/Time   CHOL 129 10/24/2021 0146   TRIG 155 (H) 10/24/2021 0146   HDL 25 (L) 10/24/2021 0146   CHOLHDL 5.2 10/24/2021 0146   VLDL 31 10/24/2021 0146   LDLCALC 73 10/24/2021 0146    Physical Exam:    VS:  BP 128/80   Pulse 81   Ht 5\' 2"  (1.575 m)   Wt 195 lb (88.5 kg)   SpO2 96%   BMI 35.67 kg/m     Wt Readings from Last 3 Encounters:  01/31/22 195 lb (88.5 kg)  11/08/21 204 lb (92.5 kg)  11/01/21 206 lb 9.6 oz (93.7 kg)     GEN: Well nourished, well developed in no acute distress CARDIAC: RRR, no murmurs, rubs, gallops.  Pacer pocket well-healed.       ASSESSMENT:    1. Symptomatic bradycardia   2. Cardiac pacemaker in situ   3. Primary hypertension    PLAN:    In order of problems listed above:  #Symptomatic bradycardia #Intermittent complete heart block #Pacemaker in situ Device  functioning appropriately.  Continue remote monitoring.  #Hypertension At goal today.  Recommend checking blood pressures 1-2 times per week at home and recording the values.  Recommend bringing these recordings to the primary care physician.   Follow-up 1 year with APP.  Medication Adjustments/Labs and Tests Ordered: Current medicines are reviewed at length with the patient today.  Concerns regarding medicines are outlined above.   Orders Placed This Encounter  Procedures   EKG 12-Lead   No orders of the defined types were placed in this encounter.  I,Mathew Stumpf,acting as  a scribe for Vickie Epley, MD.,have documented all relevant documentation on the behalf of Vickie Epley, MD,as directed by  Vickie Epley, MD while in the presence of Vickie Epley, MD.  I, Vickie Epley, MD, have reviewed all documentation for this visit. The documentation on 01/31/22 for the exam, diagnosis, procedures, and orders are all accurate and complete.   Signed, Lars Mage, MD, [facility], [facility] 01/31/2022 4:07 PM    Electrophysiology [facility]

## 2022-02-02 DIAGNOSIS — N1832 Chronic kidney disease, stage 3b: Secondary | ICD-10-CM | POA: Diagnosis not present

## 2022-02-02 DIAGNOSIS — D631 Anemia in chronic kidney disease: Secondary | ICD-10-CM | POA: Diagnosis not present

## 2022-02-02 DIAGNOSIS — N2581 Secondary hyperparathyroidism of renal origin: Secondary | ICD-10-CM | POA: Diagnosis not present

## 2022-02-02 DIAGNOSIS — I129 Hypertensive chronic kidney disease with stage 1 through stage 4 chronic kidney disease, or unspecified chronic kidney disease: Secondary | ICD-10-CM | POA: Diagnosis not present

## 2022-02-05 ENCOUNTER — Encounter: Payer: Self-pay | Admitting: Cardiology

## 2022-02-06 ENCOUNTER — Ambulatory Visit: Payer: Medicare Other | Admitting: Cardiology

## 2022-02-07 ENCOUNTER — Ambulatory Visit: Payer: Medicare Other | Admitting: Podiatry

## 2022-02-07 ENCOUNTER — Encounter: Payer: Self-pay | Admitting: Podiatry

## 2022-02-07 DIAGNOSIS — B351 Tinea unguium: Secondary | ICD-10-CM | POA: Diagnosis not present

## 2022-02-07 DIAGNOSIS — M79674 Pain in right toe(s): Secondary | ICD-10-CM | POA: Diagnosis not present

## 2022-02-07 DIAGNOSIS — M79675 Pain in left toe(s): Secondary | ICD-10-CM

## 2022-02-07 NOTE — Progress Notes (Signed)
Subjective:   Patient ID: Connie Swanson, female   DOB: 83 y.o.   MRN: [id number]   HPI Patient presents with painful nail disease 1-5 both feet that are thick and hard for her to cut   ROS      Objective:  Physical Exam  Neuro vascular status intact with yellow brittle nailbeds 1-5 both feet painful     Assessment:  Chronic mycotic nail infection 1-5 both feet with pain     Plan:  H&P done debrided nailbeds 1-5 both feet no iatrogenic bleeding reappoint routine care

## 2022-02-12 DIAGNOSIS — K080 Exfoliation of teeth due to systemic causes: Secondary | ICD-10-CM | POA: Diagnosis not present

## 2022-02-13 DIAGNOSIS — I5032 Chronic diastolic (congestive) heart failure: Secondary | ICD-10-CM | POA: Diagnosis not present

## 2022-02-14 ENCOUNTER — Other Ambulatory Visit: Payer: Self-pay | Admitting: Cardiology

## 2022-02-14 DIAGNOSIS — I5032 Chronic diastolic (congestive) heart failure: Secondary | ICD-10-CM

## 2022-02-14 NOTE — Telephone Encounter (Signed)
This was d/c correct?

## 2022-02-15 DIAGNOSIS — I5032 Chronic diastolic (congestive) heart failure: Secondary | ICD-10-CM | POA: Diagnosis not present

## 2022-02-16 ENCOUNTER — Ambulatory Visit: Payer: Self-pay

## 2022-02-16 LAB — BASIC METABOLIC PANEL
BUN/Creatinine Ratio: 12 (ref 12–28)
BUN: 19 mg/dL (ref 8–27)
CO2: 23 mmol/L (ref 20–29)
Calcium: 9.9 mg/dL (ref 8.7–10.3)
Chloride: 100 mmol/L (ref 96–106)
Creatinine, Ser: 1.62 mg/dL — ABNORMAL HIGH (ref 0.57–1.00)
Glucose: 200 mg/dL — ABNORMAL HIGH (ref 70–99)
Potassium: 4.3 mmol/L (ref 3.5–5.2)
Sodium: 141 mmol/L (ref 134–144)
eGFR: 31 mL/min/{1.73_m2} — ABNORMAL LOW (ref >59)

## 2022-02-16 LAB — PRO B NATRIURETIC PEPTIDE: NT-Pro BNP: 2141 pg/mL — ABNORMAL HIGH (ref 0–738)

## 2022-02-16 LAB — MAGNESIUM: Magnesium: 2 mg/dL (ref 1.6–2.3)

## 2022-02-16 NOTE — Patient Outreach (Signed)
  Care Coordination   Follow Up Visit Note   02/16/2022 Name: Connie Swanson MRN: [id number] DOB: [birth year]  Connie Swanson is a 82 y.o. year old female who sees [name], [name], MD for primary care. I spoke with  [name] by phone today.  What matters to the patients health and wellness today?  Patient will discuss Cardiac Rehab with her Cardiologist at next scheduled visit.     Goals Addressed               This Visit's Progress     Patient Stated     I am checking my weights daily (pt-stated)        Care Coordination Interventions: Evaluation of current treatment plan related to CHF and patient's adherence to plan as established by provider Determined patient continues to experience intermittent shortness of breath, worse with ambulation and activity of daily living Discussed with patient she did receive the educational materials related to deep breathing exercises, she is utilizing these exercises with good effectiveness  Determined patient completed recent Cardiology follow up for Pacer check Review of patient status, including review of consultant's reports, relevant laboratory and other test results, and medications completed Reiterated to patient importance of ongoing monitoring of blood pressure as directed and daily weights  Educated patient on use of diuretics to manage fluid per direction of her Cardiologist  Educated patient about Cardiac Rehab and provided an overview of this service including the health benefits Encouraged patient to ask her Cardiologist about Cardiac Rehab at next scheduled visit Reviewed scheduled/upcoming provider appointment including: next Cardiology follow up appointment with Dr. Terri Skains scheduled for 02/20/22 @1 :70 PM        COMPLETED: I will be starting Linzess (pt-stated)        Care Coordination Interventions: Evaluation of current treatment plan related to chronic constipation  and patient's adherence to plan as established by  provider Determined patient continues to take Linzess with good effectiveness, she alternates with Senokot  Discussed with patient she feels her chronic constipation is well controlled at this time         SDOH assessments and interventions completed:  Yes  SDOH Interventions Today    Flowsheet Row Most Recent Value  SDOH Interventions   Transportation Interventions Intervention Not Indicated        Care Coordination Interventions:  Yes, provided   Follow up plan: Follow up call scheduled for 04/17/22 @09 :00 AM    Encounter Outcome:  Pt. Visit Completed

## 2022-02-16 NOTE — Patient Instructions (Signed)
Visit Information  Thank you for taking time to visit with me today. Please don't hesitate to contact me if I can be of assistance to you.   Following are the goals we discussed today:   Goals Addressed               This Visit's Progress     Patient Stated     I am checking my weights daily (pt-stated)        Care Coordination Interventions: Evaluation of current treatment plan related to CHF and patient's adherence to plan as established by provider Determined patient continues to experience intermittent shortness of breath, worse with ambulation and activity of daily living Discussed with patient she did receive the educational materials related to deep breathing exercises, she is utilizing these exercises with good effectiveness  Determined patient completed recent Cardiology follow up for Pacer check Review of patient status, including review of consultant's reports, relevant laboratory and other test results, and medications completed Reiterated to patient importance of ongoing monitoring of blood pressure as directed and daily weights  Educated patient on use of diuretics to manage fluid per direction of her Cardiologist  Educated patient about Cardiac Rehab and provided an overview of this service including the health benefits Encouraged patient to ask her Cardiologist about Cardiac Rehab at next scheduled visit Reviewed scheduled/upcoming provider appointment including: next Cardiology follow up appointment with Dr. Terri Skains scheduled for 02/20/22 @1$ :45 PM        COMPLETED: I will be starting Linzess (pt-stated)        Care Coordination Interventions: Evaluation of current treatment plan related to chronic constipation  and patient's adherence to plan as established by provider Determined patient continues to take [place] with good effectiveness, she alternates with Senokot  Discussed with patient she feels her chronic constipation is well controlled at this time          Our next appointment is by telephone on 04/17/22 at 0900 AM  Please call the care guide team at [phone] if you need to cancel or reschedule your appointment.   If you are experiencing a Mental Health or Dent or need someone to talk to, please go to [facility] [place] [phone])  The patient verbalized understanding of instructions, educational materials, and care plan provided today and DECLINED offer to receive copy of patient instructions, educational materials, and care plan.   Barb Merino, RN, BSN, CCM Care Management Coordinator [facility] Care Management Direct Phone: [phone]

## 2022-02-20 ENCOUNTER — Encounter: Payer: Self-pay | Admitting: Cardiology

## 2022-02-20 ENCOUNTER — Ambulatory Visit: Payer: Medicare Other | Admitting: Cardiology

## 2022-02-20 VITALS — BP 138/72 | HR 85 | Resp 16 | Ht 62.0 in | Wt 192.8 lb

## 2022-02-20 DIAGNOSIS — E1165 Type 2 diabetes mellitus with hyperglycemia: Secondary | ICD-10-CM

## 2022-02-20 DIAGNOSIS — E782 Mixed hyperlipidemia: Secondary | ICD-10-CM

## 2022-02-20 DIAGNOSIS — Z950 Presence of cardiac pacemaker: Secondary | ICD-10-CM

## 2022-02-20 DIAGNOSIS — I442 Atrioventricular block, complete: Secondary | ICD-10-CM | POA: Diagnosis not present

## 2022-02-20 DIAGNOSIS — I10 Essential (primary) hypertension: Secondary | ICD-10-CM

## 2022-02-20 DIAGNOSIS — R001 Bradycardia, unspecified: Secondary | ICD-10-CM

## 2022-02-20 DIAGNOSIS — I2510 Atherosclerotic heart disease of native coronary artery without angina pectoris: Secondary | ICD-10-CM

## 2022-02-20 DIAGNOSIS — I5032 Chronic diastolic (congestive) heart failure: Secondary | ICD-10-CM | POA: Diagnosis not present

## 2022-02-20 MED ORDER — BUMETANIDE 0.5 MG PO TABS: 0.5000 mg | ORAL_TABLET | Freq: Every day | ORAL | 0 refills | Status: DC

## 2022-02-20 NOTE — Progress Notes (Signed)
ID:  DIVYA NEIGHBOR, DOB Dec 23, 1940, MRN [id number]  PCP:  Haywood Pao, MD  Cardiologist:  Rex Kras, DO, [facility] (established care 04/27/2020) Former Cardiology Providers: Dr. Adrian Prows  Nephrologist: Dr. Posey Pronto   Date: 02/20/22 Last Office Visit: 11/01/2021  Chief Complaint  Patient presents with   Chronic heart failure with preserved ejection fraction   Follow-up    3 months    HPI  Connie Swanson is a 83 y.o. female whose past medical history and cardiovascular risk factors include: Intermittent complete heart block status post pacemaker implantation (10/2021), HFpEF, single-vessel CAD (nonobstructive), insulin-dependent diabetes mellitus type 2, chronic kidney disease stage IIIb/IV, hypertension, history of ischemic colitis, hypothyroidism, obesity due to excess calories, vertigo, history of COVID-19 infection (January 2022), postmenopausal female, advanced age.  Patient had a long history of bradycardia despite cessation of AV nodal blocking agents.  She had symptomatic episodes of near syncope/syncope associated with intermittent complete heart block.  She underwent pacemaker implantation in October 2023.  She also has history of HFpEF for which she is on GDMT but uptitration of medical therapy has been difficult due to chronic kidney disease stage IIIb/IV.  She presents today for approximately 7-monthfollow-up visit.  She has lost 14 pounds since last office visit due to diuresis and lifestyle changes.  She is less short of breath but still present with effort related activities.  She recently had a pacemaker check with Dr. LQuentin Oreand is due for 1 year follow-up next year.  She denies anginal discomfort.  FUNCTIONAL STATUS: No structured exercise program or daily routine.   ALLERGIES: Allergies  Allergen Reactions   Dapagliflozin Nausea And Vomiting    Farxiga    MEDICATION LIST PRIOR TO VISIT: Current Meds  Medication Sig   acetaminophen (TYLENOL) 325 MG tablet  Take 2 tablets (650 mg total) by mouth every 4 (four) hours as needed for headache or mild pain.   allopurinol (ZYLOPRIM) 300 MG tablet Take 300 mg by mouth daily.   atorvastatin (LIPITOR) 40 MG tablet Take 40 mg by mouth daily.   B-D INS SYR ULTRAFINE 1CC/30G 30G X 1/2" 1 ML MISC USE SYRINGE AS DIRECTED TWICE DAILY   calcitRIOL (ROCALTROL) 0.25 MCG capsule Take 0.25 mcg by mouth every other day. Mon, Wed, Fri   Cholecalciferol (VITAMIN D3) 125 MCG (5000 UT) TABS Take 5,000 Units by mouth daily at 12 noon.   denosumab (PROLIA) 60 MG/ML SOSY injection Inject 60 mg into the skin every 6 (six) months.   empagliflozin (JARDIANCE) 10 MG TABS tablet Take 1 tablet (10 mg total) by mouth daily before breakfast.   hydrALAZINE (APRESOLINE) 50 MG tablet TAKE 1 TABLET BY MOUTH THREE TIMES DAILY   isosorbide mononitrate (IMDUR) 30 MG 24 hr tablet Take 1/2 (one-half) tablet by mouth once daily   Magnesium Oxide -Mg Supplement (KP MAG-OXIDE MAGNESIUM) 200 MG TABS Take 1 tablet (200 mg total) by mouth in the morning, at noon, and at bedtime. (Patient taking differently: Take 250 mg by mouth in the morning, at noon, and at bedtime.)   metoprolol succinate (TOPROL-XL) 50 MG 24 hr tablet Take 1 tablet (50 mg total) by mouth daily. Take with or immediately following a meal.   sacubitril-valsartan (ENTRESTO) 24-26 MG Take 1 tablet by mouth 2 (two) times daily.   senna (SENOKOT) 8.6 MG tablet Take 1 tablet by mouth daily.   [DISCONTINUED] bumetanide (BUMEX) 0.5 MG tablet Take 0.5 mg by mouth daily.  PAST MEDICAL HISTORY: Past Medical History:  Diagnosis Date   CHF (congestive heart failure) ([place])    Chronic kidney disease    Complete heart block ([place])    Coronary artery disease    Diabetes mellitus without complication ([place])    Hyperlipidemia    Hypertension    Pacemaker Medtronic dual chamber Pacemaker Assurity Dr-Rf 10/24/2021 11/29/2021    PAST SURGICAL HISTORY: Past Surgical History:  Procedure  Laterality Date   BREAST EXCISIONAL BIOPSY Right    HEMORRHOID SURGERY     MENISCUS REPAIR     PACEMAKER IMPLANT N/A 10/24/2021   Procedure: PACEMAKER IMPLANT;  Surgeon: Vickie Epley, MD;  Location: [facility];  Service: Cardiovascular;  Laterality: N/A;   THYROID SURGERY     VAGINAL HYSTERECTOMY      FAMILY HISTORY: The patient family history is not on file. She was adopted.  SOCIAL HISTORY:  The patient  reports that she has never smoked. She has never used smokeless tobacco. She reports that she does not drink alcohol and does not use drugs.  REVIEW OF SYSTEMS: Review of Systems  Constitutional: Positive for weight loss.  Cardiovascular:  Positive for dyspnea on exertion (better). Negative for chest pain, claudication, irregular heartbeat, leg swelling, near-syncope, orthopnea, palpitations, paroxysmal nocturnal dyspnea and syncope.  Respiratory:  Positive for shortness of breath.   Hematologic/Lymphatic: Negative for bleeding problem.  Musculoskeletal:  Negative for muscle cramps and myalgias.  Neurological:  Negative for dizziness and light-headedness.   PHYSICAL EXAM:    02/20/2022    1:43 PM 01/31/2022    3:46 PM 11/08/2021    3:19 PM  Vitals with BMI  Height 5' 2"$  5' 2"$    Weight 192 lbs 13 oz 195 lbs   BMI [id number]   Systolic 0000000 0000000 0000000  Diastolic 72 80 86  Pulse 85 81    CONSTITUTIONAL: Age-appropriate female, hemodynamically stable, no acute distress.    SKIN: Skin is warm and dry. No rash noted. No cyanosis. No pallor. No jaundice HEAD: Normocephalic and atraumatic.  EYES: No scleral icterus MOUTH/THROAT: Moist oral membranes.  NECK: Short neck, increased adipose tissue, no JVD present. No thyromegaly noted. No carotid bruits  CHEST Normal respiratory effort. No intercostal retractions.  Pacemaker noted in the left infraclavicular region LUNGS: Clear to auscultation bilaterally.  No stridor. No wheezes. No rales.  CARDIOVASCULAR: Regular,  [id number], soft holosystolic murmur heard at the apex, no gallops or rubs ABDOMINAL: Obese, nontender, nondistended, positive bowel sounds in all 4 quadrants, no apparent ascites.  EXTREMITIES: No peripheral edema,  no bilateral posterior tibial pulses. HEMATOLOGIC: No significant bruising NEUROLOGIC: Oriented to person, place, and time. Nonfocal. Normal muscle tone.  PSYCHIATRIC: Normal mood and affect. Normal behavior. Cooperative  RADIOLOGY:  VQ Scan:  04/28/2020: Negative perfusion lung scan for pulmonary embolism.  CARDIAC DATABASE: Pacemaker:  10/24/2021:  Dual chamber permanent pacemaker with left bundle area lead. Abbott Assurity MRI Implant 10/24/2021, by Dr. Quentin Ore.   EKG: 10/17/2021: Sinus bradycardia with complete heart block, 38 bpm, left axis, left anterior fascicular block, right bundle branch block, cannot rule out anterolateral ischemia. 11/01/2021: Normal sinus rhythm, 69 bpm, left axis, left bundle branch block, consider old inferior infarct.  Echocardiogram: 05/04/2020: Normal LV systolic function with visual EF 50-55%. Left ventricle cavity is normal in size. Severe concentric hypertrophy of the left ventricle. Normal global wall motion. Unable to evaluate diastolic function due to mitral annular calcification. Elevated LAP.  Native mitral valve. Posterior annular calcification.  No evidence of mitral stenosis. Mild (Grade I) mitral regurgitation. Mild tricuspid regurgitation. No evidence of pulmonary hypertension. RVSP measures 32 mmHg. No prior studies available for comparison.   Stress Testing: Lexiscan Tetrofosmin stress test 05/11/2020: Lexiscan nuclear stress test performed using 1-day protocol. SPECT images show uniform breast tissue attenuation in both rest and stress images. In addition, there is medium sized, medium intensity, fixed perfusion defect in basal inferior/inferoseptal myocardium. All segments of left ventricle demonstrated septal dyskinesis,  global hypokinesis. Stress LVEF 34%. High risk study due to low stress LVEF.  Heart Catheterization: 10/10/2010: HEMODYNAMIC DATA:  Right heart catheterization:  RA pressure 7/7, mean 6 mmHg.  RA saturation 76%.   RV pressure 123456, end-diastolic pressure 4 mmHg.   PA pressure 27/40 with a mean of 21 mmHg.  PA saturation was 75%.   Pulmonary capillary wedge 6/5 with a mean of 3 mmHg.  Aortic saturation was 100%.   Cardiac output was 6.4 with a cardiac index of 3.34 by Fick.   Right coronary artery:  Right coronary artery is a large caliber and superdominant vessel.  Gives origin to large PL branch and 2 PDA large branches.  Smooth and normal.   Left main coronary artery:  Left main coronary artery is a large-caliber vessel, which is smooth and normal.   Circumflex coronary artery:  Circumflex coronary artery is a very large- caliber vessel.  Gives origin to a moderate-sized obtuse marginal 1 and continues distally as a large obtuse marginal 2.  Smooth and normal.   LAD:  LAD is a large-caliber vessel in the proximal segment.  Gives origin to a moderate-sized diagonal 1 and then gives origin to a very large diagonal 2, which is bigger than the LAD itself.  Between diagonal 1 and diagonal 2, there is a eccentric 50% stenosis noted in the LAD. The diagonal 2, which is very large has a ostial 30% stenosis. Otherwise, except for this focal stenosis of 50%, there is no other lesion noted in the LAD or the large diagonal 2.  LABORATORY DATA:    Latest Ref Rng & Units 10/24/2021    1:46 AM 10/24/2021    1:05 AM 10/20/2021    5:22 PM  CBC  WBC 4.0 - 10.5 K/uL 11.3  10.6  12.1   Hemoglobin 12.0 - 15.0 g/dL 10.9  10.5  10.7   Hematocrit 36.0 - 46.0 % 33.9  33.3  33.4   Platelets 150 - 400 K/uL 264  269  298        Latest Ref Rng & Units 02/15/2022    2:23 PM 12/29/2021    2:58 PM 12/14/2021    1:48 PM  CMP  Glucose 70 - 99 mg/dL 200  112  106   BUN 8 - 27 mg/dL 19  22  18    $ Creatinine 0.57 - 1.00 mg/dL 1.62  1.84  1.64   Sodium 134 - 144 mmol/L 141  144  143   Potassium 3.5 - 5.2 mmol/L 4.3  4.9  5.3   Chloride 96 - 106 mmol/L 100  109  107   CO2 20 - 29 mmol/L 23  22  23   $ Calcium 8.7 - 10.3 mg/dL 9.9  8.7  9.5     Lipid Panel     Component Value Date/Time   CHOL 129 10/24/2021 0146   TRIG 155 (H) 10/24/2021 0146   HDL 25 (L) 10/24/2021 0146   CHOLHDL 5.2 10/24/2021 0146   VLDL 31 10/24/2021  0146   LDLCALC 73 10/24/2021 0146    No components found for: "NTPROBNP" Recent Labs    04/25/21 1449 11/08/21 1150 11/17/21 1514 12/14/21 1348 12/29/21 1456 02/15/22 1424  PROBNP 2,343* 10,474* 4,884* 7,500* 3,999* 2,141*   Recent Labs    10/17/21 1540  TSH 2.542    BMP Recent Labs    10/20/21 1722 10/24/21 0105 10/25/21 0954 11/08/21 1146 12/14/21 1348 12/29/21 1458 02/15/22 1423  NA 138 137 139   < > 143 144 141  K 4.6 3.9 4.3   < > 5.3* 4.9 4.3  CL 104 104 107   < > 107* 109* 100  CO2 25 26 23   $ < > 23 22 23  $ GLUCOSE 106* 140* 236*   < > 106* 112* 200*  BUN 31* 27* 27*   < > 18 22 19  $ CREATININE 1.99* 2.26* 2.07*   < > 1.64* 1.84* 1.62*  CALCIUM 9.9 9.1 9.3   < > 9.5 8.7 9.9  GFRNONAA 24* 21* 23*  --   --   --   --    < > = values in this interval not displayed.    HEMOGLOBIN A1C No results found for: "HGBA1C", "MPG"  External labs: 09/07/2019:  Total cholesterol 147, triglycerides 186, HDL 27, LDL 83, non-HDL 120 TSH 2.15 Hemoglobin A1c 6.5 White count 11.75, hemoglobin 11.1 g/dL, hematocrit 34.3%  External Labs: Collected: 10/13/2021 provided by referring physician. NT proBNP 2450 EGFR 23.8 Sodium 143, potassium 4.7, chloride 104, bicarb 28 AST 11, ALT 10, alkaline phosphatase 60. BUN 26, creatinine 2 Hemoglobin 10.9 g/dL, hematocrit 31.5% Total cholesterol 134, triglycerides 119, HDL 27, LDL 83, non-HDL 107 TSH 3.03 A1c 6  IMPRESSION:    ICD-10-CM   1. Chronic heart failure with preserved ejection fraction  (HCC)  99991111 Basic metabolic panel    Magnesium    Pro b natriuretic peptide (BNP)    bumetanide (BUMEX) 0.5 MG tablet    2. Symptomatic bradycardia  R00.1     3. Complete heart block, transient (HCC)  I44.2     4. Cardiac pacemaker in situ  Z95.0     5. Type 2 diabetes mellitus with hyperglycemia, with long-term current use of insulin (HCC)  E11.65    Z79.4     6. Nonobstructive atherosclerosis of coronary artery  I25.10     7. Benign hypertension  I10     8. Mixed hyperlipidemia  E78.2      RECOMMENDATIONS: Connie Swanson is a 82 y.o. female whose past medical history and cardiac risk factors include: Intermittent complete heart block status post pacemaker implantation (10/2021), HFpEF, single-vessel CAD (nonobstructive), insulin-dependent diabetes mellitus type 2, chronic kidney disease, hypertension, history of ischemic colitis, hypothyroidism, obesity due to excess calories, vertigo, history of COVID-19 infection (January 2022), postmenopausal female, advanced age.  Chronic heart failure with preserved ejection fraction ([place]), stage B, NYHA class II/III Euvolemic. Has lost 14 pounds since last office visit. No longer using nasal cannula oxygen her moving with wheelchair. Still has dyspnea on exertion. I have reinitiated Entresto since last office visit. NT proBNP trending down but still not at baseline. She takes Bumex 0.5 mg p.o. as needed basis. I have asked her to start taking it on a daily basis with labs in 1 week to reevaluate kidney function. Medications reconciled. Remote patient monitoring data reviewed-average blood pressure 132/75 with a pulse of 67 and average weight 193 pounds  Symptomatic bradycardia Complete heart block (HCC) Pacemaker in  situ Patient was having symptomatic episodes of bradycardia with near syncope syncope. Underwent pacemaker implantation with Dr. Quentin Ore 10/24/2021. Recently had her device check with Dr. Quentin Ore and is due for 1 year  follow-up visit in 2025. Will transfer her pacemaker data to our practice as per her wishes  Type 2 diabetes mellitus with hyperglycemia, with long-term current use of insulin ([place]) Re emphasized the importance of of glycemic control. Continue statin therapy.  Mixed hyperlipidemia Currently on atorvastatin. Does not endorse myalgias. Currently managed by primary care provider.  Benign hypertension Office blood pressures are now within range. Medication changes as noted above  FINAL MEDICATION LIST END OF ENCOUNTER: Meds ordered this encounter  Medications   bumetanide (BUMEX) 0.5 MG tablet    Sig: Take 1 tablet (0.5 mg total) by mouth daily.    Dispense:  90 tablet    Refill:  0     Medications Discontinued During This Encounter  Medication Reason   bumetanide (BUMEX) 0.5 MG tablet Reorder      Current Outpatient Medications:    acetaminophen (TYLENOL) 325 MG tablet, Take 2 tablets (650 mg total) by mouth every 4 (four) hours as needed for headache or mild pain., Disp: , Rfl:    allopurinol (ZYLOPRIM) 300 MG tablet, Take 300 mg by mouth daily., Disp: , Rfl:    atorvastatin (LIPITOR) 40 MG tablet, Take 40 mg by mouth daily., Disp: , Rfl:    B-D INS SYR ULTRAFINE 1CC/30G 30G X 1/2" 1 ML MISC, USE SYRINGE AS DIRECTED TWICE DAILY, Disp: , Rfl:    calcitRIOL (ROCALTROL) 0.25 MCG capsule, Take 0.25 mcg by mouth every other day. Mon, Wed, Fri, Disp: , Rfl:    Cholecalciferol (VITAMIN D3) 125 MCG (5000 UT) TABS, Take 5,000 Units by mouth daily at 12 noon., Disp: , Rfl:    denosumab (PROLIA) 60 MG/ML SOSY injection, Inject 60 mg into the skin every 6 (six) months., Disp: , Rfl:    empagliflozin (JARDIANCE) 10 MG TABS tablet, Take 1 tablet (10 mg total) by mouth daily before breakfast., Disp: 30 tablet, Rfl: 5   hydrALAZINE (APRESOLINE) 50 MG tablet, TAKE 1 TABLET BY MOUTH THREE TIMES DAILY, Disp: 90 tablet, Rfl: 3   isosorbide mononitrate (IMDUR) 30 MG 24 hr tablet, Take 1/2  (one-half) tablet by mouth once daily, Disp: 45 tablet, Rfl: 0   Magnesium Oxide -Mg Supplement (KP MAG-OXIDE MAGNESIUM) 200 MG TABS, Take 1 tablet (200 mg total) by mouth in the morning, at noon, and at bedtime. (Patient taking differently: Take 250 mg by mouth in the morning, at noon, and at bedtime.), Disp: 270 tablet, Rfl: 1   metoprolol succinate (TOPROL-XL) 50 MG 24 hr tablet, Take 1 tablet (50 mg total) by mouth daily. Take with or immediately following a meal., Disp: 30 tablet, Rfl: 11   sacubitril-valsartan (ENTRESTO) 24-26 MG, Take 1 tablet by mouth 2 (two) times daily., Disp: , Rfl:    senna (SENOKOT) 8.6 MG tablet, Take 1 tablet by mouth daily., Disp: , Rfl:    bumetanide (BUMEX) 0.5 MG tablet, Take 1 tablet (0.5 mg total) by mouth daily., Disp: 90 tablet, Rfl: 0  Orders Placed This Encounter  Procedures   Basic metabolic panel   Magnesium   Pro b natriuretic peptide (BNP)    There are no Patient Instructions on file for this visit.   --Continue cardiac medications as reconciled in final medication list. --Return in about 6 months (around 08/21/2022) for heart failure management.. Or sooner if  needed. --Continue follow-up with your primary care physician regarding the management of your other chronic comorbid conditions.  Patient's questions and concerns were addressed to her satisfaction. She voices understanding of the instructions provided during this encounter.   This note was created using a voice recognition software as a result there may be grammatical errors inadvertently enclosed that do not reflect the nature of this encounter. Every attempt is made to correct such errors.   Rex Kras, [place], [facility]  Pager: [phone] Office: [phone]

## 2022-03-01 DIAGNOSIS — I5032 Chronic diastolic (congestive) heart failure: Secondary | ICD-10-CM | POA: Diagnosis not present

## 2022-03-02 LAB — PRO B NATRIURETIC PEPTIDE: NT-Pro BNP: 1840 pg/mL — ABNORMAL HIGH (ref 0–738)

## 2022-03-02 LAB — BASIC METABOLIC PANEL
BUN/Creatinine Ratio: 12 (ref 12–28)
BUN: 26 mg/dL (ref 8–27)
CO2: 25 mmol/L (ref 20–29)
Calcium: 10 mg/dL (ref 8.7–10.3)
Chloride: 99 mmol/L (ref 96–106)
Creatinine, Ser: 2.14 mg/dL — ABNORMAL HIGH (ref 0.57–1.00)
Glucose: 152 mg/dL — ABNORMAL HIGH (ref 70–99)
Potassium: 4.7 mmol/L (ref 3.5–5.2)
Sodium: 140 mmol/L (ref 134–144)
eGFR: 22 mL/min/{1.73_m2} — ABNORMAL LOW (ref >59)

## 2022-03-02 LAB — MAGNESIUM: Magnesium: 2.1 mg/dL (ref 1.6–2.3)

## 2022-03-15 DIAGNOSIS — I5032 Chronic diastolic (congestive) heart failure: Secondary | ICD-10-CM | POA: Diagnosis not present

## 2022-03-19 ENCOUNTER — Other Ambulatory Visit: Payer: Self-pay

## 2022-03-19 DIAGNOSIS — I5032 Chronic diastolic (congestive) heart failure: Secondary | ICD-10-CM

## 2022-03-22 DIAGNOSIS — I5032 Chronic diastolic (congestive) heart failure: Secondary | ICD-10-CM | POA: Diagnosis not present

## 2022-03-23 LAB — BASIC METABOLIC PANEL
BUN/Creatinine Ratio: 12 (ref 12–28)
BUN: 26 mg/dL (ref 8–27)
CO2: 21 mmol/L (ref 20–29)
Calcium: 9.1 mg/dL (ref 8.7–10.3)
Chloride: 103 mmol/L (ref 96–106)
Creatinine, Ser: 2.16 mg/dL — ABNORMAL HIGH (ref 0.57–1.00)
Glucose: 149 mg/dL — ABNORMAL HIGH (ref 70–99)
Potassium: 4.5 mmol/L (ref 3.5–5.2)
Sodium: 141 mmol/L (ref 134–144)
eGFR: 22 mL/min/{1.73_m2} — ABNORMAL LOW (ref >59)

## 2022-03-23 LAB — MAGNESIUM: Magnesium: 2.4 mg/dL — ABNORMAL HIGH (ref 1.6–2.3)

## 2022-03-23 LAB — PRO B NATRIURETIC PEPTIDE: NT-Pro BNP: 1810 pg/mL — ABNORMAL HIGH (ref 0–738)

## 2022-03-26 DIAGNOSIS — K080 Exfoliation of teeth due to systemic causes: Secondary | ICD-10-CM | POA: Diagnosis not present

## 2022-03-29 ENCOUNTER — Other Ambulatory Visit: Payer: Self-pay

## 2022-03-29 DIAGNOSIS — I5032 Chronic diastolic (congestive) heart failure: Secondary | ICD-10-CM

## 2022-04-02 ENCOUNTER — Other Ambulatory Visit: Payer: Self-pay | Admitting: Cardiology

## 2022-04-02 DIAGNOSIS — I5032 Chronic diastolic (congestive) heart failure: Secondary | ICD-10-CM

## 2022-04-02 DIAGNOSIS — I10 Essential (primary) hypertension: Secondary | ICD-10-CM

## 2022-04-14 DIAGNOSIS — I5032 Chronic diastolic (congestive) heart failure: Secondary | ICD-10-CM | POA: Diagnosis not present

## 2022-04-18 ENCOUNTER — Ambulatory Visit: Payer: Medicare Other | Admitting: Podiatry

## 2022-04-18 ENCOUNTER — Encounter: Payer: Self-pay | Admitting: Podiatry

## 2022-04-18 DIAGNOSIS — B351 Tinea unguium: Secondary | ICD-10-CM | POA: Diagnosis not present

## 2022-04-18 DIAGNOSIS — M79674 Pain in right toe(s): Secondary | ICD-10-CM

## 2022-04-18 DIAGNOSIS — M79675 Pain in left toe(s): Secondary | ICD-10-CM | POA: Diagnosis not present

## 2022-04-18 DIAGNOSIS — E119 Type 2 diabetes mellitus without complications: Secondary | ICD-10-CM | POA: Diagnosis not present

## 2022-04-18 NOTE — Progress Notes (Signed)
This patient returns to my office for at risk foot care.  This patient requires this care by a professional since this patient will be at risk due to having diabetes and chronic kidney disease   This patient is unable to cut nails herself since the patient cannot reach her nails.These nails are painful walking and wearing shoes.  This patient presents for at risk foot care today.  General Appearance  Alert, conversant and in no acute stress.  Vascular  Dorsalis pedis and posterior tibial  pulses are palpable  bilaterally.  Capillary return is within normal limits  bilaterally. Temperature is within normal limits  bilaterally.  Neurologic  Senn-Weinstein monofilament wire test within normal limits  bilaterally. Muscle power within normal limits bilaterally.  Nails Thick disfigured discolored nails with subungual debris  Hallux nails  bilaterally. No evidence of bacterial infection or drainage bilaterally.  Orthopedic  No limitations of motion  feet .  No crepitus or effusions noted.  No bony pathology or digital deformities noted.Mild  HAV  B/L.  Skin  normotropic skin with no porokeratosis noted bilaterally.  No signs of infections or ulcers noted.     Onychomycosis  Pain in right toes  Pain in left toes  Consent was obtained for treatment procedures.   Mechanical debridement of nails 1-5  bilaterally performed with a nail nipper.  Filed with dremel without incident.    Return office visit  10 weeks                   Told patient to return for periodic foot care and evaluation due to potential at risk complications.   Sallie Maker DPM  

## 2022-04-23 NOTE — Telephone Encounter (Signed)
error 

## 2022-04-25 DIAGNOSIS — Z45018 Encounter for adjustment and management of other part of cardiac pacemaker: Secondary | ICD-10-CM | POA: Diagnosis not present

## 2022-04-25 DIAGNOSIS — I442 Atrioventricular block, complete: Secondary | ICD-10-CM | POA: Diagnosis not present

## 2022-04-30 ENCOUNTER — Other Ambulatory Visit: Payer: Self-pay

## 2022-04-30 DIAGNOSIS — I5032 Chronic diastolic (congestive) heart failure: Secondary | ICD-10-CM

## 2022-04-30 DIAGNOSIS — I10 Essential (primary) hypertension: Secondary | ICD-10-CM

## 2022-04-30 MED ORDER — HYDRALAZINE HCL 25 MG PO TABS: 25.0000 mg | ORAL_TABLET | Freq: Three times a day (TID) | ORAL | 2 refills | Status: DC

## 2022-04-30 NOTE — Progress Notes (Signed)
Per conversation with Dr. Odis Hollingshead - reducing hydralazine  TID to  TID due to patient complaints of edema. Patient to get follow up labs in 2 weeks.  04/26/22 4:17 PM 130 / 56 70     04/26/22 4:04 PM 141 / 70 71     04/25/22 11:10 AM 115 / 55 73     04/23/22 3:00 PM 126 / 68 67     04/20/22 6:52 AM 128 / 65 70     04/19/22 6:45 AM 133 / 69 68     04/18/22 6:56 AM 129 / 66 67     04/16/22 9:26 PM 109 / 51 74     04/10/22 7:10 AM 126 / 60 71

## 2022-05-01 ENCOUNTER — Ambulatory Visit: Payer: Self-pay

## 2022-05-01 NOTE — Patient Instructions (Signed)
Visit Information  Thank you for taking time to visit with me today. Please don't hesitate to contact me if I can be of assistance to you.   Following are the goals we discussed today:   Goals Addressed               This Visit's Progress     Patient Stated     I am checking my weights daily (pt-stated)        Care Coordination Interventions: Evaluation of current treatment plan related to CHF and patient's adherence to plan as established by provider Reviewed and discussed with patient her recent Cardiology follow up with for review of her CHF Review of patient status, including review of consultant's reports, relevant laboratory and other test results, and medications completed Determined patient discussed Cardiac rehab with her Cardiologist, discussed patient received the approval of her Cardiologist, however she is unsure if she will move forward at this time Discussed importance of daily weight and advised patient to weigh and record daily Provided patient with education about the role of exercise in the management of heart failure Educated patient about the PREP program, mailed printed brochure         Our next appointment is by telephone on 07/31/22 at 10:30 AM  Please call the care guide team at [phone] if you need to cancel or reschedule your appointment.   If you are experiencing a Mental Health or Behavioral Health Crisis or need someone to talk to, please call 1-800-273-TALK (toll free, 24 hour hotline) go to [facility] [address] [place] [phone])  The patient verbalized understanding of instructions, educational materials, and care plan provided today and DECLINED offer to receive copy of patient instructions, educational materials, and care plan.   Delsa Sale, RN, BSN, CCM Care Management Coordinator [facility] Management  Direct Phone: [phone]

## 2022-05-01 NOTE — Patient Outreach (Signed)
  Care Coordination   Follow Up Visit Note   05/01/2022 Name: Connie Swanson MRN: [id number] DOB: 15-Sep-1940  Connie Swanson is a 82 y.o. year old female who sees [name], Adelfa Koh, MD for primary care. I spoke with  Marcelyn Ditty by phone today.  What matters to the patients health and wellness today?  Patient will consider starting Cardiac Rehab. She will review the PREP brochure and consider participation.     Goals Addressed               This Visit's Progress     Patient Stated     I am checking my weights daily (pt-stated)        Care Coordination Interventions: Evaluation of current treatment plan related to CHF and patient's adherence to plan as established by provider Reviewed and discussed with patient her recent Cardiology follow up with for review of her CHF Review of patient status, including review of consultant's reports, relevant laboratory and other test results, and medications completed Determined patient discussed Cardiac rehab with her Cardiologist, discussed patient received the approval of her Cardiologist, however she is unsure if she will move forward at this time Discussed importance of daily weight and advised patient to weigh and record daily Provided patient with education about the role of exercise in the management of heart failure Educated patient about the PREP program, mailed printed brochure    Interventions Today    Flowsheet Row Most Recent Value  Chronic Disease   Chronic disease during today's visit Congestive Heart Failure (CHF)  General Interventions   General Interventions Discussed/Reviewed General Interventions Reviewed, General Interventions Discussed, Labs, Doctor Visits  Doctor Visits Discussed/Reviewed Doctor Visits Discussed, Doctor Visits Reviewed, PCP, Specialist  Exercise Interventions   Exercise Discussed/Reviewed Exercise Discussed, Exercise Reviewed, Physical Activity  Physical Activity Discussed/Reviewed Physical Activity  Reviewed, Physical Activity Discussed, Types of exercise, PREP  [Cardiac Rehab]  Education Interventions   Education Provided Provided Education, Provided Printed Education  Provided Verbal Education On Medication, Exercise, When to see the doctor  Pharmacy Interventions   Pharmacy Dicussed/Reviewed Pharmacy Topics Discussed, Medications and their functions, Pharmacy Topics Reviewed          SDOH assessments and interventions completed:  No     Care Coordination Interventions:  Yes, provided   Follow up plan: Follow up call scheduled for 07/31/22 :30 AM    Encounter Outcome:  Pt. Visit Completed

## 2022-05-03 DIAGNOSIS — N1832 Chronic kidney disease, stage 3b: Secondary | ICD-10-CM | POA: Diagnosis not present

## 2022-05-08 DIAGNOSIS — I130 Hypertensive heart and chronic kidney disease with heart failure and stage 1 through stage 4 chronic kidney disease, or unspecified chronic kidney disease: Secondary | ICD-10-CM | POA: Diagnosis not present

## 2022-05-08 DIAGNOSIS — E1129 Type 2 diabetes mellitus with other diabetic kidney complication: Secondary | ICD-10-CM | POA: Diagnosis not present

## 2022-05-09 ENCOUNTER — Other Ambulatory Visit (HOSPITAL_COMMUNITY): Payer: Self-pay | Admitting: *Deleted

## 2022-05-09 DIAGNOSIS — N184 Chronic kidney disease, stage 4 (severe): Secondary | ICD-10-CM | POA: Diagnosis not present

## 2022-05-09 DIAGNOSIS — I129 Hypertensive chronic kidney disease with stage 1 through stage 4 chronic kidney disease, or unspecified chronic kidney disease: Secondary | ICD-10-CM | POA: Diagnosis not present

## 2022-05-09 DIAGNOSIS — D631 Anemia in chronic kidney disease: Secondary | ICD-10-CM | POA: Diagnosis not present

## 2022-05-09 DIAGNOSIS — N2581 Secondary hyperparathyroidism of renal origin: Secondary | ICD-10-CM | POA: Diagnosis not present

## 2022-05-10 ENCOUNTER — Ambulatory Visit (HOSPITAL_COMMUNITY)
Admission: RE | Admit: 2022-05-10 | Discharge: 2022-05-10 | Disposition: A | Discharge status: Home / Self Care | Payer: Medicare Other | Source: Ambulatory Visit | Attending: Internal Medicine | Admitting: Internal Medicine

## 2022-05-10 DIAGNOSIS — M810 Age-related osteoporosis without current pathological fracture: Secondary | ICD-10-CM | POA: Diagnosis not present

## 2022-05-10 MED ORDER — DENOSUMAB 60 MG/ML ~~LOC~~ SOSY
PREFILLED_SYRINGE | SUBCUTANEOUS | Status: AC
  Filled 2022-05-10: qty 1

## 2022-05-10 MED ORDER — DENOSUMAB 60 MG/ML ~~LOC~~ SOSY
60.0000 mg | PREFILLED_SYRINGE | Freq: Once | SUBCUTANEOUS | Status: AC
  Administered 2022-05-10: 60 mg via SUBCUTANEOUS

## 2022-05-14 DIAGNOSIS — I5032 Chronic diastolic (congestive) heart failure: Secondary | ICD-10-CM | POA: Diagnosis not present

## 2022-05-17 DIAGNOSIS — E875 Hyperkalemia: Secondary | ICD-10-CM | POA: Diagnosis not present

## 2022-05-17 DIAGNOSIS — N184 Chronic kidney disease, stage 4 (severe): Secondary | ICD-10-CM | POA: Diagnosis not present

## 2022-05-17 DIAGNOSIS — I130 Hypertensive heart and chronic kidney disease with heart failure and stage 1 through stage 4 chronic kidney disease, or unspecified chronic kidney disease: Secondary | ICD-10-CM | POA: Diagnosis not present

## 2022-05-17 DIAGNOSIS — I5032 Chronic diastolic (congestive) heart failure: Secondary | ICD-10-CM | POA: Diagnosis not present

## 2022-05-18 LAB — BASIC METABOLIC PANEL
BUN/Creatinine Ratio: 17 (ref 12–28)
BUN: 29 mg/dL — ABNORMAL HIGH (ref 8–27)
CO2: 20 mmol/L (ref 20–29)
Calcium: 8.3 mg/dL — ABNORMAL LOW (ref 8.7–10.3)
Chloride: 104 mmol/L (ref 96–106)
Creatinine, Ser: 1.68 mg/dL — ABNORMAL HIGH (ref 0.57–1.00)
Glucose: 159 mg/dL — ABNORMAL HIGH (ref 70–99)
Potassium: 4.2 mmol/L (ref 3.5–5.2)
Sodium: 139 mmol/L (ref 134–144)
eGFR: 30 mL/min/{1.73_m2} — ABNORMAL LOW (ref >59)

## 2022-05-18 LAB — MAGNESIUM: Magnesium: 1.9 mg/dL (ref 1.6–2.3)

## 2022-05-18 LAB — PRO B NATRIURETIC PEPTIDE: NT-Pro BNP: 925 pg/mL — ABNORMAL HIGH (ref 0–738)

## 2022-05-23 ENCOUNTER — Other Ambulatory Visit: Payer: Self-pay | Admitting: Cardiology

## 2022-05-23 ENCOUNTER — Telehealth: Payer: Self-pay

## 2022-05-23 DIAGNOSIS — I5032 Chronic diastolic (congestive) heart failure: Secondary | ICD-10-CM

## 2022-05-25 MED ORDER — BUMETANIDE 0.5 MG PO TABS
0.5000 mg | ORAL_TABLET | Freq: Every day | ORAL | 0 refills | Status: DC | PRN
Start: 2022-05-25 — End: 2023-03-15

## 2022-05-25 NOTE — Telephone Encounter (Signed)
Medications reconciled.  Patient's blood pressures at times have been soft.  Renal function relatively at baseline  Recently saw nephrology who recommends a goal systolic blood pressure of 130 mmHg, agree  Recommend holding Bumex for now and to use it on as needed basis.  Will target a weight of 194 pounds for now, this is based on her ambulatory blood pressure and weight log.  If she gains more than 3 pounds over a day or 5 pounds over a week she will resume Bumex 0.5 mg p.o. daily until she is back down to 194 pounds.  Lab Results  Component Value Date   NA 139 05/17/2022   K 4.2 05/17/2022   CO2 20 05/17/2022   GLUCOSE 159 (H) 05/17/2022   BUN 29 (H) 05/17/2022   CREATININE 1.68 (H) 05/17/2022   CALCIUM 8.3 (L) 05/17/2022   EGFR 30 (L) 05/17/2022   GFRNONAA 23 (L) 10/25/2021   30-d blood pressure average 134/71 mmHg 30-d heart rate average is 67 bpm 30-d weight average is 196 lbs  Telephone encounter: 10 minutes  Zabian Swayne [place], DO, [facility]

## 2022-05-28 NOTE — Progress Notes (Signed)
Called and spoke with patient regarding her recent lab results. She said that she spoke with Dr. Odis Hollingshead last night. She is aware.

## 2022-06-13 DIAGNOSIS — I5032 Chronic diastolic (congestive) heart failure: Secondary | ICD-10-CM | POA: Diagnosis not present

## 2022-06-27 ENCOUNTER — Ambulatory Visit (INDEPENDENT_AMBULATORY_CARE_PROVIDER_SITE_OTHER): Payer: Medicare Other | Admitting: Podiatry

## 2022-06-27 ENCOUNTER — Encounter: Payer: Self-pay | Admitting: Podiatry

## 2022-06-27 VITALS — BP 139/67

## 2022-06-27 DIAGNOSIS — M79675 Pain in left toe(s): Secondary | ICD-10-CM | POA: Diagnosis not present

## 2022-06-27 DIAGNOSIS — M79674 Pain in right toe(s): Secondary | ICD-10-CM | POA: Diagnosis not present

## 2022-06-27 DIAGNOSIS — B351 Tinea unguium: Secondary | ICD-10-CM | POA: Diagnosis not present

## 2022-06-27 DIAGNOSIS — E119 Type 2 diabetes mellitus without complications: Secondary | ICD-10-CM

## 2022-06-27 NOTE — Progress Notes (Signed)
This patient returns to my office for at risk foot care.  This patient requires this care by a professional since this patient will be at risk due to having diabetes and chronic kidney disease   This patient is unable to cut nails herself since the patient cannot reach her nails.These nails are painful walking and wearing shoes.  This patient presents for at risk foot care today.  General Appearance  Alert, conversant and in no acute stress.  Vascular  Dorsalis pedis and posterior tibial  pulses are palpable  bilaterally.  Capillary return is within normal limits  bilaterally. Temperature is within normal limits  bilaterally.  Neurologic  Senn-Weinstein monofilament wire test within normal limits  bilaterally. Muscle power within normal limits bilaterally.  Nails Thick disfigured discolored nails with subungual debris  Hallux nails  bilaterally. No evidence of bacterial infection or drainage bilaterally.  Orthopedic  No limitations of motion  feet .  No crepitus or effusions noted.  No bony pathology or digital deformities noted.Mild  HAV  B/L.  Skin  normotropic skin with no porokeratosis noted bilaterally.  No signs of infections or ulcers noted.     Onychomycosis  Pain in right toes  Pain in left toes  Consent was obtained for treatment procedures.   Mechanical debridement of nails 1-5  bilaterally performed with a nail nipper.  Filed with dremel without incident.    Return office visit  10 weeks                   Told patient to return for periodic foot care and evaluation due to potential at risk complications.   Cortlan Dolin DPM  

## 2022-07-02 DIAGNOSIS — K080 Exfoliation of teeth due to systemic causes: Secondary | ICD-10-CM | POA: Diagnosis not present

## 2022-07-10 ENCOUNTER — Other Ambulatory Visit: Payer: Self-pay | Admitting: Cardiology

## 2022-07-10 DIAGNOSIS — I5032 Chronic diastolic (congestive) heart failure: Secondary | ICD-10-CM

## 2022-07-10 DIAGNOSIS — I10 Essential (primary) hypertension: Secondary | ICD-10-CM

## 2022-07-25 DIAGNOSIS — Z45018 Encounter for adjustment and management of other part of cardiac pacemaker: Secondary | ICD-10-CM | POA: Diagnosis not present

## 2022-07-25 DIAGNOSIS — I442 Atrioventricular block, complete: Secondary | ICD-10-CM | POA: Diagnosis not present

## 2022-07-31 ENCOUNTER — Ambulatory Visit: Payer: Self-pay

## 2022-07-31 NOTE — Patient Instructions (Signed)
Visit Information  Thank you for taking time to visit with me today. Please don't hesitate to contact me if I can be of assistance to you.   Following are the goals we discussed today:   Goals Addressed               This Visit's Progress     Patient Stated     I am checking my weights daily (pt-stated)        Care Coordination Interventions: Evaluation of current treatment plan related to CHF and patient's adherence to plan as established by provider Determined patient opted not to participate in Cardiac rehab, she does not wish to participate in the PREP program at this time  Educated patient on the benefits of establishing a routine exercise regimen to help with fatigue and muscle strengthening Educated patient with rationale on importance of using deep breathing exercises to help with shortness of breath  Encouraged patient to check her oxygen levels with home pulse oximetry to ensure oxygen levels are staying >90% Reviewed with patient her upcoming scheduled PCP follow up with Dr. Wylene Simmer set for 08/06/22 and upcoming Cardiology f/u with Dr. Odis Hollingshead set for 08/21/22 Encouraged patient to notify her doctor of new symptoms or concerns promptly         Our next appointment is by telephone on 10/01/22 at 11:30 AM  Please call the care guide team at [phone] if you need to cancel or reschedule your appointment.   If you are experiencing a Mental Health or Behavioral Health Crisis or need someone to talk to, please call 1-800-273-TALK (toll free, 24 hour hotline)  The patient verbalized understanding of instructions, educational materials, and care plan provided today and DECLINED offer to receive copy of patient instructions, educational materials, and care plan.   Delsa Sale, RN, BSN, CCM Care Management Coordinator [facility] Care Management Direct Phone: [phone]

## 2022-07-31 NOTE — Patient Outreach (Signed)
  Care Coordination   Follow Up Visit Note   07/31/2022 Name: Connie Swanson MRN: [id number] DOB: 27-Nov-1940  Connie Swanson is a 82 y.o. year old female who sees [name], Adelfa Koh, MD for primary care. I spoke with  Marcelyn Ditty by phone today.  What matters to the patients health and wellness today?  Patient would like to have less fatigue and shortness of breath with activity.     Goals Addressed               This Visit's Progress     Patient Stated     I am checking my weights daily (pt-stated)        Care Coordination Interventions: Evaluation of current treatment plan related to CHF and patient's adherence to plan as established by provider Determined patient opted not to participate in Cardiac rehab, she does not wish to participate in the PREP program at this time  Educated patient on the benefits of establishing a routine exercise regimen to help with fatigue and muscle strengthening Educated patient with rationale on importance of using deep breathing exercises to help with shortness of breath  Encouraged patient to check her oxygen levels with home pulse oximetry to ensure oxygen levels are staying >90% Reviewed with patient her upcoming scheduled PCP follow up with Dr. Wylene Simmer set for 08/06/22 and upcoming Cardiology f/u with Dr. Odis Hollingshead set for 08/21/22 Encouraged patient to notify her doctor of new symptoms or concerns promptly     Interventions Today    Flowsheet Row Most Recent Value  Chronic Disease   Chronic disease during today's visit Congestive Heart Failure (CHF)  General Interventions   General Interventions Discussed/Reviewed General Interventions Reviewed, General Interventions Discussed, Doctor Visits, Labs  Doctor Visits Discussed/Reviewed Doctor Visits Discussed, Doctor Visits Reviewed, Specialist  Exercise Interventions   Exercise Discussed/Reviewed Physical Activity, Exercise Reviewed, Exercise Discussed  Physical Activity Discussed/Reviewed Physical  Activity Reviewed, Physical Activity Discussed, PREP  Education Interventions   Education Provided Provided Education  Provided Verbal Education On Labs, Exercise, When to see the doctor  Labs Reviewed Kidney Function  Nutrition Interventions   Nutrition Discussed/Reviewed Fluid intake, Nutrition Reviewed, Nutrition Discussed  Safety Interventions   Safety Discussed/Reviewed Fall Risk, Home Safety, Safety Reviewed, Safety Discussed  Home Safety Assistive Devices          SDOH assessments and interventions completed:  No     Care Coordination Interventions:  Yes, provided   Follow up plan: Follow up call scheduled for 10/01/22 @11 :30 AM    Encounter Outcome:  Pt. Visit Completed

## 2022-08-06 DIAGNOSIS — I130 Hypertensive heart and chronic kidney disease with heart failure and stage 1 through stage 4 chronic kidney disease, or unspecified chronic kidney disease: Secondary | ICD-10-CM | POA: Diagnosis not present

## 2022-08-06 DIAGNOSIS — E1129 Type 2 diabetes mellitus with other diabetic kidney complication: Secondary | ICD-10-CM | POA: Diagnosis not present

## 2022-08-06 DIAGNOSIS — E440 Moderate protein-calorie malnutrition: Secondary | ICD-10-CM | POA: Diagnosis not present

## 2022-08-07 DIAGNOSIS — K080 Exfoliation of teeth due to systemic causes: Secondary | ICD-10-CM | POA: Diagnosis not present

## 2022-08-21 ENCOUNTER — Ambulatory Visit: Payer: Medicare Other | Admitting: Cardiology

## 2022-08-21 ENCOUNTER — Encounter: Payer: Self-pay | Admitting: Cardiology

## 2022-08-21 VITALS — BP 123/72 | HR 70 | Resp 16 | Ht 62.0 in | Wt 203.6 lb

## 2022-08-21 DIAGNOSIS — Z950 Presence of cardiac pacemaker: Secondary | ICD-10-CM

## 2022-08-21 DIAGNOSIS — I2510 Atherosclerotic heart disease of native coronary artery without angina pectoris: Secondary | ICD-10-CM

## 2022-08-21 DIAGNOSIS — I5032 Chronic diastolic (congestive) heart failure: Secondary | ICD-10-CM | POA: Diagnosis not present

## 2022-08-21 DIAGNOSIS — E1165 Type 2 diabetes mellitus with hyperglycemia: Secondary | ICD-10-CM

## 2022-08-21 DIAGNOSIS — E782 Mixed hyperlipidemia: Secondary | ICD-10-CM | POA: Diagnosis not present

## 2022-08-21 DIAGNOSIS — I10 Essential (primary) hypertension: Secondary | ICD-10-CM | POA: Diagnosis not present

## 2022-08-21 DIAGNOSIS — R001 Bradycardia, unspecified: Secondary | ICD-10-CM

## 2022-08-21 DIAGNOSIS — I442 Atrioventricular block, complete: Secondary | ICD-10-CM | POA: Diagnosis not present

## 2022-08-21 NOTE — Progress Notes (Signed)
ID:  Connie Swanson, DOB 07-Oct-1940, MRN [id number]  PCP:  Gaspar Garbe, MD  Cardiologist:  Tessa Lerner, DO, [facility] (established care 04/27/2020) Former Cardiology Providers: Dr. Yates Decamp  Nephrologist: Dr. Allena Katz   Date: 08/21/22 Last Office Visit: 02/20/2022  Chief Complaint  Patient presents with   Chronic heart failure with preserved ejection fraction (HCC)   Follow-up    HPI  Connie Swanson is a 82 y.o. female whose past medical history and cardiovascular risk factors include: Intermittent complete heart block status post pacemaker implantation (10/2021), HFpEF, single-vessel CAD (nonobstructive), insulin-dependent diabetes mellitus type 2, chronic kidney disease stage IIIb/IV, hypertension, history of ischemic colitis, hypothyroidism, obesity due to excess calories, vertigo, history of COVID-19 infection (January 2022), postmenopausal female, advanced age.  Patient being followed by the practice given her history of bradycardia/intermittent complete heart block status post pacemaker and HFpEF.  She presents today for 33-month follow-up visit.  She denies anginal chest pain or heart failure symptoms.  Overall functional capacity remains stable.  No change in overall physical endurance.  She has gained approximately 9 pounds over the 6 months likely secondary to dietary indiscretion.  Her dyspnea on exertion remains relatively stable.  She has an appointment with nephrology coming up as well as labs with PCP on September 09, 2022.  No recent labs available for review.   FUNCTIONAL STATUS: No structured exercise program or daily routine.   ALLERGIES: Allergies  Allergen Reactions   Dapagliflozin Nausea And Vomiting    Farxiga    MEDICATION LIST PRIOR TO VISIT: Current Meds  Medication Sig   acetaminophen (TYLENOL) 325 MG tablet Take 2 tablets (650 mg total) by mouth every 4 (four) hours as needed for headache or mild pain.   allopurinol (ZYLOPRIM) 300 MG tablet Take 300  mg by mouth daily.   atorvastatin (LIPITOR) 40 MG tablet Take 40 mg by mouth daily.   B-D INS SYR ULTRAFINE 1CC/30G 30G X 1/2" 1 ML MISC USE SYRINGE AS DIRECTED TWICE DAILY   bumetanide (BUMEX) 0.5 MG tablet Take 1 tablet (0.5 mg total) by mouth daily as needed.   calcitRIOL (ROCALTROL) 0.25 MCG capsule Take 0.25 mcg by mouth every other day. Mon, Wed, Fri   Cholecalciferol (VITAMIN D3) 125 MCG (5000 UT) TABS Take 5,000 Units by mouth daily at 12 noon.   denosumab (PROLIA) 60 MG/ML SOSY injection Inject 60 mg into the skin every 6 (six) months.   empagliflozin (JARDIANCE) 10 MG TABS tablet Take 1 tablet (10 mg total) by mouth daily before breakfast.   hydrALAZINE (APRESOLINE) 25 MG tablet Take 1 tablet (25 mg total) by mouth 3 (three) times daily.   isosorbide mononitrate (IMDUR) 30 MG 24 hr tablet Take 1/2 (one-half) tablet by mouth once daily   metoprolol succinate (TOPROL-XL) 50 MG 24 hr tablet Take 1 tablet (50 mg total) by mouth daily. Take with or immediately following a meal.   sacubitril-valsartan (ENTRESTO) 24-26 MG Take 1 tablet by mouth 2 (two) times daily.   senna (SENOKOT) 8.6 MG tablet Take 1 tablet by mouth daily.     PAST MEDICAL HISTORY: Past Medical History:  Diagnosis Date   CHF (congestive heart failure) (HCC)    Chronic kidney disease    Complete heart block (HCC)    Coronary artery disease    Diabetes mellitus without complication (HCC)    Hyperlipidemia    Hypertension    Pacemaker Medtronic dual chamber Pacemaker Assurity Dr-Rf 10/24/2021 11/29/2021  PAST SURGICAL HISTORY: Past Surgical History:  Procedure Laterality Date   BREAST EXCISIONAL BIOPSY Right    HEMORRHOID SURGERY     MENISCUS REPAIR     PACEMAKER IMPLANT N/A 10/24/2021   Procedure: PACEMAKER IMPLANT;  Surgeon: Lanier Prude, MD;  Location: [facility] INVASIVE [facility];  Service: Cardiovascular;  Laterality: N/A;   THYROID SURGERY     VAGINAL HYSTERECTOMY      FAMILY HISTORY: The patient  family history is not on file. She was adopted.  SOCIAL HISTORY:  The patient  reports that she has never smoked. She has never used smokeless tobacco. She reports that she does not drink alcohol and does not use drugs.  REVIEW OF SYSTEMS: Review of Systems  Constitutional: Positive for weight gain.  Cardiovascular:  Positive for dyspnea on exertion (better). Negative for chest pain, claudication, irregular heartbeat, leg swelling, near-syncope, orthopnea, palpitations, paroxysmal nocturnal dyspnea and syncope.  Respiratory:  Positive for shortness of breath.   Hematologic/Lymphatic: Negative for bleeding problem.  Musculoskeletal:  Negative for muscle cramps and myalgias.  Neurological:  Negative for dizziness and light-headedness.   PHYSICAL EXAM:    08/21/2022   11:01 AM 06/27/2022    2:24 PM 05/10/2022   11:11 AM  Vitals with BMI  Height 5\' 2"     Weight 203 lbs 10 oz    BMI 37.23    Systolic 123 139 696  Diastolic 72 67 70  Pulse 70  62   Physical Exam  Constitutional: No distress.  Age appropriate, hemodynamically stable.   Neck: No JVD present.  Cardiovascular: Normal rate, regular rhythm, S1 normal, S2 normal, intact distal pulses and normal pulses. Exam reveals no gallop, no S3 and no S4.  No murmur heard. Pulmonary/Chest: Effort normal and breath sounds normal. No stridor. She has no wheezes. She has no rales.  Pacemaker in situ  Abdominal: Soft. Bowel sounds are normal. She exhibits no distension. There is no abdominal tenderness.  Abdominal obesity  Musculoskeletal:        General: No edema.     Cervical back: Neck supple.  Neurological: She is alert and oriented to person, place, and time. She has intact cranial nerves (2-12).  Skin: Skin is warm and moist.   RADIOLOGY:  VQ Scan:  04/28/2020: Negative perfusion lung scan for pulmonary embolism.  CARDIAC DATABASE: Pacemaker:  10/24/2021:  Dual chamber permanent pacemaker with left bundle area lead. [employer]  Assurity MRI Implant 10/24/2021, by Dr. Lalla Brothers.   Remote dual-chamber pacemaker transmission 07/25/2022: Longevity 8 years and 0 months.  AP 3.4 %, VP 99%.  Lead impedance and thresholds are normal.  There are no high rate episodes.  Normal pacemaker function.  EKG: 10/17/2021: Sinus bradycardia with complete heart block, 38 bpm, left axis, left anterior fascicular block, right bundle branch block, cannot rule out anterolateral ischemia. 11/01/2021: Normal sinus rhythm, 69 bpm, left axis, left bundle branch block, consider old inferior infarct. 08/21/2022: Sinus rhythm, 68 bpm, atrial sensed ventricular paced rhythm, rare PVCs.  Echocardiogram: 05/04/2020: Normal LV systolic function with visual EF 50-55%. Left ventricle cavity is normal in size. Severe concentric hypertrophy of the left ventricle. Normal global wall motion. Unable to evaluate diastolic function due to mitral annular calcification. Elevated LAP.  Native mitral valve. Posterior annular calcification. No evidence of mitral stenosis. Mild (Grade I) mitral regurgitation. Mild tricuspid regurgitation. No evidence of pulmonary hypertension. RVSP measures 32 mmHg. No prior studies available for comparison.   Stress Testing: Lexiscan Tetrofosmin stress test 05/11/2020:  Lexiscan nuclear stress test performed using 1-day protocol. SPECT images show uniform breast tissue attenuation in both rest and stress images. In addition, there is medium sized, medium intensity, fixed perfusion defect in basal inferior/inferoseptal myocardium. All segments of left ventricle demonstrated septal dyskinesis, global hypokinesis. Stress LVEF 34%. High risk study due to low stress LVEF.  Heart Catheterization: 10/10/2010: HEMODYNAMIC DATA:  Right heart catheterization:  RA pressure 7/7, mean 6 mmHg.  RA saturation 76%.   RV pressure 24/1, end-diastolic pressure 4 mmHg.   PA pressure 27/40 with a mean of 21 mmHg.  PA saturation was 75%.   Pulmonary  capillary wedge 6/5 with a mean of 3 mmHg.  Aortic saturation was 100%.   Cardiac output was 6.4 with a cardiac index of 3.34 by Fick.   Right coronary artery:  Right coronary artery is a large caliber and superdominant vessel.  Gives origin to large PL branch and 2 PDA large branches.  Smooth and normal.   Left main coronary artery:  Left main coronary artery is a large-caliber vessel, which is smooth and normal.   Circumflex coronary artery:  Circumflex coronary artery is a very large- caliber vessel.  Gives origin to a moderate-sized obtuse marginal 1 and continues distally as a large obtuse marginal 2.  Smooth and normal.   LAD:  LAD is a large-caliber vessel in the proximal segment.  Gives origin to a moderate-sized diagonal 1 and then gives origin to a very large diagonal 2, which is bigger than the LAD itself.  Between diagonal 1 and diagonal 2, there is a eccentric 50% stenosis noted in the LAD. The diagonal 2, which is very large has a ostial 30% stenosis. Otherwise, except for this focal stenosis of 50%, there is no other lesion noted in the LAD or the large diagonal 2.  LABORATORY DATA:    Latest Ref Rng & Units 10/24/2021    1:46 AM 10/24/2021    1:05 AM 10/20/2021    5:22 PM  CBC  WBC 4.0 - 10.5 K/uL 11.3  10.6  12.1   Hemoglobin 12.0 - 15.0 g/dL 34.7  42.5  95.6   Hematocrit 36.0 - 46.0 % 33.9  33.3  33.4   Platelets 150 - 400 K/uL 264  269  298        Latest Ref Rng & Units 05/17/2022    2:13 PM 03/22/2022   10:48 AM 03/01/2022   10:33 AM  CMP  Glucose 70 - 99 mg/dL 387  564  332   BUN 8 - 27 mg/dL 29  26  26    Creatinine 0.57 - 1.00 mg/dL 9.51  8.84  1.66   Sodium 134 - 144 mmol/L 139  141  140   Potassium 3.5 - 5.2 mmol/L 4.2  4.5  4.7   Chloride 96 - 106 mmol/L 104  103  99   CO2 20 - 29 mmol/L 20  21  25    Calcium 8.7 - 10.3 mg/dL 8.3  9.1  06.3     Lipid Panel     Component Value Date/Time   CHOL 129 10/24/2021 0146   TRIG 155 (H) 10/24/2021  0146   HDL 25 (L) 10/24/2021 0146   CHOLHDL 5.2 10/24/2021 0146   VLDL 31 10/24/2021 0146   LDLCALC 73 10/24/2021 0146    No components found for: "NTPROBNP" Recent Labs    11/08/21 1150 11/17/21 1514 12/14/21 1348 12/29/21 1456 02/15/22 1424 03/01/22 1034 03/22/22 1048 05/17/22 1406  PROBNP 10,474*  4,540* 7,500* 3,999* 2,141* 1,840* 1,810* 925*   Recent Labs    10/17/21 1540  TSH 2.542    BMP Recent Labs    10/20/21 1722 10/24/21 0105 10/25/21 0954 11/08/21 1146 03/01/22 1033 03/22/22 1048 05/17/22 1413  NA 138 137 139   < > 140 141 139  K 4.6 3.9 4.3   < > 4.7 4.5 4.2  CL 104 104 107   < > 99 103 104  CO2 25 26 23    < > 25 21 20   GLUCOSE 106* 140* 236*   < > 152* 149* 159*  BUN 31* 27* 27*   < > 26 26 29*  CREATININE 1.99* 2.26* 2.07*   < > 2.14* 2.16* 1.68*  CALCIUM 9.9 9.1 9.3   < > 10.0 9.1 8.3*  GFRNONAA 24* 21* 23*  --   --   --   --    < > = values in this interval not displayed.    HEMOGLOBIN A1C No results found for: "HGBA1C", "MPG"  External labs: 09/07/2019:  Total cholesterol 147, triglycerides 186, HDL 27, LDL 83, non-HDL 120 TSH 2.15 Hemoglobin A1c 6.5 White count 11.75, hemoglobin 11.1 g/dL, hematocrit 98.1%  External Labs: Collected: 10/13/2021 provided by referring physician. NT proBNP 2450 EGFR 23.8 Sodium 143, potassium 4.7, chloride 104, bicarb 28 AST 11, ALT 10, alkaline phosphatase 60. BUN 26, creatinine 2 Hemoglobin 10.9 g/dL, hematocrit 19.1% Total cholesterol 134, triglycerides 119, HDL 27, LDL 83, non-HDL 107 TSH 3.03 A1c 6  IMPRESSION:    ICD-10-CM   1. Chronic heart failure with preserved ejection fraction (HCC)  I50.32 EKG 12-Lead     RECOMMENDATIONS: ADASYN CAPPUCCI is a 82 y.o. female whose past medical history and cardiac risk factors include: Intermittent complete heart block status post pacemaker implantation (10/2021), HFpEF, single-vessel CAD (nonobstructive), insulin-dependent diabetes mellitus type 2,  chronic kidney disease, hypertension, history of ischemic colitis, hypothyroidism, obesity due to excess calories, vertigo, history of COVID-19 infection (January 2022), postmenopausal female, advanced age.  Chronic heart failure with preserved ejection fraction (HCC) Stage B, NYHA class II/III Euvolemic on physical examination. 9 pounds of weight gain over the last 6 months likely secondary to dietary indiscretion. Medications reconciled. No recent labs available for review. Continues to use Bumex on as needed basis given her CKD  No changes warranted at this time.  Nonobstructive atherosclerosis of coronary artery Denies anginal chest pain. EKG shows sinus rhythm without ischemia or injury pattern. Reemphasized the importance of secondary prevention with focus on improving her modifiable cardiovascular risk factors such as glycemic control, lipid management, blood pressure control, weight loss.  Symptomatic bradycardia Complete heart block, transient (HCC) Cardiac pacemaker in situ Most recent outpatient monitoring from July 2024 independently reviewed. EKG shows sinus rhythm with a ventricular paced rhythm. Monitor for now  Type 2 diabetes mellitus with hyperglycemia, with long-term current use of insulin (HCC) Re emphasized importance of glycemic control. Currently on ARNI, Jardiance, statin therapy  Mixed hyperlipidemia Currently on atorvastatin.   She denies myalgia or other side effects. Currently managed by primary care provider.  FINAL MEDICATION LIST END OF ENCOUNTER: No orders of the defined types were placed in this encounter.    There are no discontinued medications.     Current Outpatient Medications:    acetaminophen (TYLENOL) 325 MG tablet, Take 2 tablets (650 mg total) by mouth every 4 (four) hours as needed for headache or mild pain., Disp: , Rfl:    allopurinol (ZYLOPRIM) 300 MG  tablet, Take 300 mg by mouth daily., Disp: , Rfl:    atorvastatin (LIPITOR)  40 MG tablet, Take 40 mg by mouth daily., Disp: , Rfl:    B-D INS SYR ULTRAFINE 1CC/30G 30G X 1/2" 1 ML MISC, USE SYRINGE AS DIRECTED TWICE DAILY, Disp: , Rfl:    bumetanide (BUMEX) 0.5 MG tablet, Take 1 tablet (0.5 mg total) by mouth daily as needed., Disp: 90 tablet, Rfl: 0   calcitRIOL (ROCALTROL) 0.25 MCG capsule, Take 0.25 mcg by mouth every other day. Mon, Wed, Fri, Disp: , Rfl:    Cholecalciferol (VITAMIN D3) 125 MCG (5000 UT) TABS, Take 5,000 Units by mouth daily at 12 noon., Disp: , Rfl:    denosumab (PROLIA) 60 MG/ML SOSY injection, Inject 60 mg into the skin every 6 (six) months., Disp: , Rfl:    empagliflozin (JARDIANCE) 10 MG TABS tablet, Take 1 tablet (10 mg total) by mouth daily before breakfast., Disp: 30 tablet, Rfl: 5   hydrALAZINE (APRESOLINE) 25 MG tablet, Take 1 tablet (25 mg total) by mouth 3 (three) times daily., Disp: 90 tablet, Rfl: 2   isosorbide mononitrate (IMDUR) 30 MG 24 hr tablet, Take 1/2 (one-half) tablet by mouth once daily, Disp: 45 tablet, Rfl: 0   metoprolol succinate (TOPROL-XL) 50 MG 24 hr tablet, Take 1 tablet (50 mg total) by mouth daily. Take with or immediately following a meal., Disp: 30 tablet, Rfl: 11   sacubitril-valsartan (ENTRESTO) 24-26 MG, Take 1 tablet by mouth 2 (two) times daily., Disp: , Rfl:    senna (SENOKOT) 8.6 MG tablet, Take 1 tablet by mouth daily., Disp: , Rfl:   Orders Placed This Encounter  Procedures   EKG 12-Lead    There are no Patient Instructions on file for this visit.   --Continue cardiac medications as reconciled in final medication list. --No follow-ups on file. Or sooner if needed. --Continue follow-up with your primary care physician regarding the management of your other chronic comorbid conditions.  Patient's questions and concerns were addressed to her satisfaction. She voices understanding of the instructions provided during this encounter.   This note was created using a voice recognition software as a result  there may be grammatical errors inadvertently enclosed that do not reflect the nature of this encounter. Every attempt is made to correct such errors.   Tessa Lerner, [place], [facility]  Pager:  [phone] Office: [phone]

## 2022-09-05 ENCOUNTER — Encounter: Payer: Self-pay | Admitting: Podiatry

## 2022-09-05 ENCOUNTER — Ambulatory Visit (INDEPENDENT_AMBULATORY_CARE_PROVIDER_SITE_OTHER): Payer: Medicare Other | Admitting: Podiatry

## 2022-09-05 DIAGNOSIS — B351 Tinea unguium: Secondary | ICD-10-CM

## 2022-09-05 DIAGNOSIS — E119 Type 2 diabetes mellitus without complications: Secondary | ICD-10-CM | POA: Diagnosis not present

## 2022-09-05 DIAGNOSIS — M79675 Pain in left toe(s): Secondary | ICD-10-CM | POA: Diagnosis not present

## 2022-09-05 DIAGNOSIS — M79674 Pain in right toe(s): Secondary | ICD-10-CM | POA: Diagnosis not present

## 2022-09-05 NOTE — Progress Notes (Signed)
This patient returns to my office for at risk foot care.  This patient requires this care by a professional since this patient will be at risk due to having diabetes and chronic kidney disease   This patient is unable to cut nails herself since the patient cannot reach her nails.These nails are painful walking and wearing shoes.  This patient presents for at risk foot care today.  General Appearance  Alert, conversant and in no acute stress.  Vascular  Dorsalis pedis and posterior tibial  pulses are palpable  bilaterally.  Capillary return is within normal limits  bilaterally. Temperature is within normal limits  bilaterally.  Neurologic  Senn-Weinstein monofilament wire test within normal limits  bilaterally. Muscle power within normal limits bilaterally.  Nails Thick disfigured discolored nails with subungual debris  Hallux nails  bilaterally. No evidence of bacterial infection or drainage bilaterally.  Orthopedic  No limitations of motion  feet .  No crepitus or effusions noted.  No bony pathology or digital deformities noted.Mild  HAV  B/L.  Skin  normotropic skin with no porokeratosis noted bilaterally.  No signs of infections or ulcers noted.     Onychomycosis  Pain in right toes  Pain in left toes  Consent was obtained for treatment procedures.   Mechanical debridement of nails 1-5  bilaterally performed with a nail nipper.  Filed with dremel without incident.    Return office visit  10 weeks                   Told patient to return for periodic foot care and evaluation due to potential at risk complications.   Gardiner Barefoot DPM

## 2022-09-19 DIAGNOSIS — E119 Type 2 diabetes mellitus without complications: Secondary | ICD-10-CM | POA: Diagnosis not present

## 2022-09-19 DIAGNOSIS — Z961 Presence of intraocular lens: Secondary | ICD-10-CM | POA: Diagnosis not present

## 2022-09-19 DIAGNOSIS — H0102B Squamous blepharitis left eye, upper and lower eyelids: Secondary | ICD-10-CM | POA: Diagnosis not present

## 2022-09-19 DIAGNOSIS — H0102A Squamous blepharitis right eye, upper and lower eyelids: Secondary | ICD-10-CM | POA: Diagnosis not present

## 2022-09-26 NOTE — Telephone Encounter (Signed)
This encounter was created in error - please disregard.

## 2022-09-27 DIAGNOSIS — N184 Chronic kidney disease, stage 4 (severe): Secondary | ICD-10-CM | POA: Diagnosis not present

## 2022-10-01 ENCOUNTER — Ambulatory Visit: Payer: Self-pay

## 2022-10-01 DIAGNOSIS — I129 Hypertensive chronic kidney disease with stage 1 through stage 4 chronic kidney disease, or unspecified chronic kidney disease: Secondary | ICD-10-CM | POA: Diagnosis not present

## 2022-10-01 DIAGNOSIS — N2581 Secondary hyperparathyroidism of renal origin: Secondary | ICD-10-CM | POA: Diagnosis not present

## 2022-10-01 DIAGNOSIS — K080 Exfoliation of teeth due to systemic causes: Secondary | ICD-10-CM | POA: Diagnosis not present

## 2022-10-01 DIAGNOSIS — N184 Chronic kidney disease, stage 4 (severe): Secondary | ICD-10-CM | POA: Diagnosis not present

## 2022-10-01 DIAGNOSIS — D631 Anemia in chronic kidney disease: Secondary | ICD-10-CM | POA: Diagnosis not present

## 2022-10-01 NOTE — Patient Outreach (Signed)
Care Coordination   10/01/2022 Name: Connie Swanson MRN: [id number] DOB: Apr 08, 1940   Care Coordination Outreach Attempts:  An unsuccessful telephone outreach was attempted for a scheduled appointment today.  Follow Up Plan:  No further outreach attempts will be made at this time. We have been unable to contact the patient to offer or enroll patient in care coordination services  Encounter Outcome:  No Answer   Care Coordination Interventions:  No, not indicated    Delsa Sale RN BSN CCM [place]  [facility], [facility] Nurse Care Coordinator  Direct Dial: [phone] Website: [email or web address]

## 2022-10-09 DIAGNOSIS — E559 Vitamin D deficiency, unspecified: Secondary | ICD-10-CM | POA: Diagnosis not present

## 2022-10-09 DIAGNOSIS — E785 Hyperlipidemia, unspecified: Secondary | ICD-10-CM | POA: Diagnosis not present

## 2022-10-09 DIAGNOSIS — E1129 Type 2 diabetes mellitus with other diabetic kidney complication: Secondary | ICD-10-CM | POA: Diagnosis not present

## 2022-10-09 DIAGNOSIS — E611 Iron deficiency: Secondary | ICD-10-CM | POA: Diagnosis not present

## 2022-10-09 DIAGNOSIS — E039 Hypothyroidism, unspecified: Secondary | ICD-10-CM | POA: Diagnosis not present

## 2022-10-16 DIAGNOSIS — R82998 Other abnormal findings in urine: Secondary | ICD-10-CM | POA: Diagnosis not present

## 2022-10-16 DIAGNOSIS — E440 Moderate protein-calorie malnutrition: Secondary | ICD-10-CM | POA: Diagnosis not present

## 2022-10-16 DIAGNOSIS — Z0000 Encounter for general adult medical examination without abnormal findings: Secondary | ICD-10-CM | POA: Diagnosis not present

## 2022-10-21 ENCOUNTER — Inpatient Hospital Stay (HOSPITAL_COMMUNITY)
Admission: EM | Admit: 2022-10-21 | Discharge: 2022-10-26 | DRG: 853 | Disposition: A | Discharge status: Home Health | Payer: Medicare Other | Attending: Internal Medicine | Admitting: Internal Medicine

## 2022-10-21 ENCOUNTER — Encounter (HOSPITAL_COMMUNITY): Payer: Self-pay | Admitting: Radiology

## 2022-10-21 ENCOUNTER — Emergency Department (HOSPITAL_COMMUNITY): Payer: Medicare Other

## 2022-10-21 ENCOUNTER — Other Ambulatory Visit: Payer: Self-pay

## 2022-10-21 DIAGNOSIS — E875 Hyperkalemia: Secondary | ICD-10-CM | POA: Diagnosis present

## 2022-10-21 DIAGNOSIS — I160 Hypertensive urgency: Secondary | ICD-10-CM

## 2022-10-21 DIAGNOSIS — Z888 Allergy status to other drugs, medicaments and biological substances status: Secondary | ICD-10-CM

## 2022-10-21 DIAGNOSIS — E1122 Type 2 diabetes mellitus with diabetic chronic kidney disease: Secondary | ICD-10-CM | POA: Diagnosis present

## 2022-10-21 DIAGNOSIS — I959 Hypotension, unspecified: Secondary | ICD-10-CM | POA: Diagnosis not present

## 2022-10-21 DIAGNOSIS — J189 Pneumonia, unspecified organism: Principal | ICD-10-CM

## 2022-10-21 DIAGNOSIS — Z950 Presence of cardiac pacemaker: Secondary | ICD-10-CM

## 2022-10-21 DIAGNOSIS — K862 Cyst of pancreas: Secondary | ICD-10-CM | POA: Diagnosis not present

## 2022-10-21 DIAGNOSIS — E782 Mixed hyperlipidemia: Secondary | ICD-10-CM

## 2022-10-21 DIAGNOSIS — K660 Peritoneal adhesions (postprocedural) (postinfection): Secondary | ICD-10-CM | POA: Diagnosis present

## 2022-10-21 DIAGNOSIS — Z860100 Personal history of colon polyps, unspecified: Secondary | ICD-10-CM

## 2022-10-21 DIAGNOSIS — Z794 Long term (current) use of insulin: Secondary | ICD-10-CM | POA: Diagnosis not present

## 2022-10-21 DIAGNOSIS — R748 Abnormal levels of other serum enzymes: Secondary | ICD-10-CM | POA: Diagnosis not present

## 2022-10-21 DIAGNOSIS — I442 Atrioventricular block, complete: Secondary | ICD-10-CM

## 2022-10-21 DIAGNOSIS — E669 Obesity, unspecified: Secondary | ICD-10-CM | POA: Diagnosis not present

## 2022-10-21 DIAGNOSIS — I2583 Coronary atherosclerosis due to lipid rich plaque: Secondary | ICD-10-CM | POA: Diagnosis not present

## 2022-10-21 DIAGNOSIS — I5189 Other ill-defined heart diseases: Secondary | ICD-10-CM | POA: Diagnosis not present

## 2022-10-21 DIAGNOSIS — K828 Other specified diseases of gallbladder: Secondary | ICD-10-CM | POA: Diagnosis present

## 2022-10-21 DIAGNOSIS — K8010 Calculus of gallbladder with chronic cholecystitis without obstruction: Secondary | ICD-10-CM

## 2022-10-21 DIAGNOSIS — E8809 Other disorders of plasma-protein metabolism, not elsewhere classified: Secondary | ICD-10-CM | POA: Diagnosis not present

## 2022-10-21 DIAGNOSIS — I5032 Chronic diastolic (congestive) heart failure: Secondary | ICD-10-CM | POA: Diagnosis not present

## 2022-10-21 DIAGNOSIS — K8000 Calculus of gallbladder with acute cholecystitis without obstruction: Secondary | ICD-10-CM | POA: Diagnosis not present

## 2022-10-21 DIAGNOSIS — E785 Hyperlipidemia, unspecified: Secondary | ICD-10-CM | POA: Diagnosis not present

## 2022-10-21 DIAGNOSIS — I130 Hypertensive heart and chronic kidney disease with heart failure and stage 1 through stage 4 chronic kidney disease, or unspecified chronic kidney disease: Secondary | ICD-10-CM | POA: Diagnosis not present

## 2022-10-21 DIAGNOSIS — K8013 Calculus of gallbladder with acute and chronic cholecystitis with obstruction: Secondary | ICD-10-CM | POA: Diagnosis not present

## 2022-10-21 DIAGNOSIS — R1011 Right upper quadrant pain: Secondary | ICD-10-CM | POA: Diagnosis not present

## 2022-10-21 DIAGNOSIS — E1165 Type 2 diabetes mellitus with hyperglycemia: Secondary | ICD-10-CM

## 2022-10-21 DIAGNOSIS — J810 Acute pulmonary edema: Secondary | ICD-10-CM

## 2022-10-21 DIAGNOSIS — R000 Tachycardia, unspecified: Secondary | ICD-10-CM | POA: Diagnosis not present

## 2022-10-21 DIAGNOSIS — I2510 Atherosclerotic heart disease of native coronary artery without angina pectoris: Secondary | ICD-10-CM

## 2022-10-21 DIAGNOSIS — Z90710 Acquired absence of both cervix and uterus: Secondary | ICD-10-CM

## 2022-10-21 DIAGNOSIS — A419 Sepsis, unspecified organism: Secondary | ICD-10-CM | POA: Diagnosis not present

## 2022-10-21 DIAGNOSIS — Z79899 Other long term (current) drug therapy: Secondary | ICD-10-CM

## 2022-10-21 DIAGNOSIS — R918 Other nonspecific abnormal finding of lung field: Secondary | ICD-10-CM | POA: Diagnosis not present

## 2022-10-21 DIAGNOSIS — K810 Acute cholecystitis: Secondary | ICD-10-CM | POA: Diagnosis present

## 2022-10-21 DIAGNOSIS — R0989 Other specified symptoms and signs involving the circulatory and respiratory systems: Secondary | ICD-10-CM | POA: Diagnosis not present

## 2022-10-21 DIAGNOSIS — E66813 Obesity, class 3: Secondary | ICD-10-CM | POA: Diagnosis present

## 2022-10-21 DIAGNOSIS — N184 Chronic kidney disease, stage 4 (severe): Secondary | ICD-10-CM

## 2022-10-21 DIAGNOSIS — K8020 Calculus of gallbladder without cholecystitis without obstruction: Secondary | ICD-10-CM | POA: Diagnosis not present

## 2022-10-21 DIAGNOSIS — K838 Other specified diseases of biliary tract: Secondary | ICD-10-CM | POA: Diagnosis not present

## 2022-10-21 DIAGNOSIS — R1084 Generalized abdominal pain: Secondary | ICD-10-CM | POA: Diagnosis not present

## 2022-10-21 DIAGNOSIS — N281 Cyst of kidney, acquired: Secondary | ICD-10-CM | POA: Diagnosis not present

## 2022-10-21 DIAGNOSIS — Z7984 Long term (current) use of oral hypoglycemic drugs: Secondary | ICD-10-CM

## 2022-10-21 DIAGNOSIS — R7401 Elevation of levels of liver transaminase levels: Secondary | ICD-10-CM | POA: Diagnosis present

## 2022-10-21 DIAGNOSIS — I272 Pulmonary hypertension, unspecified: Secondary | ICD-10-CM | POA: Diagnosis present

## 2022-10-21 DIAGNOSIS — R109 Unspecified abdominal pain: Secondary | ICD-10-CM | POA: Diagnosis not present

## 2022-10-21 DIAGNOSIS — N179 Acute kidney failure, unspecified: Secondary | ICD-10-CM | POA: Diagnosis not present

## 2022-10-21 DIAGNOSIS — R739 Hyperglycemia, unspecified: Secondary | ICD-10-CM | POA: Diagnosis not present

## 2022-10-21 DIAGNOSIS — K5730 Diverticulosis of large intestine without perforation or abscess without bleeding: Secondary | ICD-10-CM | POA: Diagnosis not present

## 2022-10-21 DIAGNOSIS — Z6837 Body mass index (BMI) 37.0-37.9, adult: Secondary | ICD-10-CM

## 2022-10-21 DIAGNOSIS — R112 Nausea with vomiting, unspecified: Secondary | ICD-10-CM | POA: Diagnosis not present

## 2022-10-21 DIAGNOSIS — I5033 Acute on chronic diastolic (congestive) heart failure: Secondary | ICD-10-CM | POA: Diagnosis not present

## 2022-10-21 DIAGNOSIS — J069 Acute upper respiratory infection, unspecified: Secondary | ICD-10-CM | POA: Diagnosis present

## 2022-10-21 DIAGNOSIS — K8309 Other cholangitis: Secondary | ICD-10-CM | POA: Diagnosis not present

## 2022-10-21 DIAGNOSIS — I5031 Acute diastolic (congestive) heart failure: Secondary | ICD-10-CM | POA: Diagnosis not present

## 2022-10-21 DIAGNOSIS — K819 Cholecystitis, unspecified: Secondary | ICD-10-CM | POA: Diagnosis not present

## 2022-10-21 DIAGNOSIS — Z01810 Encounter for preprocedural cardiovascular examination: Secondary | ICD-10-CM

## 2022-10-21 DIAGNOSIS — J9811 Atelectasis: Secondary | ICD-10-CM | POA: Diagnosis not present

## 2022-10-21 DIAGNOSIS — I10 Essential (primary) hypertension: Secondary | ICD-10-CM | POA: Diagnosis not present

## 2022-10-21 DIAGNOSIS — J811 Chronic pulmonary edema: Secondary | ICD-10-CM | POA: Diagnosis present

## 2022-10-21 DIAGNOSIS — R0689 Other abnormalities of breathing: Secondary | ICD-10-CM | POA: Diagnosis not present

## 2022-10-21 DIAGNOSIS — K812 Acute cholecystitis with chronic cholecystitis: Secondary | ICD-10-CM | POA: Diagnosis not present

## 2022-10-21 DIAGNOSIS — E1169 Type 2 diabetes mellitus with other specified complication: Secondary | ICD-10-CM | POA: Insufficient documentation

## 2022-10-21 DIAGNOSIS — E119 Type 2 diabetes mellitus without complications: Secondary | ICD-10-CM | POA: Diagnosis not present

## 2022-10-21 LAB — URINALYSIS, ROUTINE W REFLEX MICROSCOPIC
Bacteria, UA: NONE SEEN
Bilirubin Urine: NEGATIVE
Glucose, UA: >=500 mg/dL — AB
Hgb urine dipstick: NEGATIVE
Ketones, ur: NEGATIVE mg/dL
Leukocytes,Ua: NEGATIVE
Nitrite: NEGATIVE
Protein, ur: 100 mg/dL — AB
Specific Gravity, Urine: 1.008 (ref 1.005–1.030)
pH: 6 (ref 5.0–8.0)

## 2022-10-21 LAB — CBC
HCT: 36.3 % (ref 36.0–46.0)
Hemoglobin: 11.4 g/dL — ABNORMAL LOW (ref 12.0–15.0)
MCH: 31.8 pg (ref 26.0–34.0)
MCHC: 31.4 g/dL (ref 30.0–36.0)
MCV: 101.4 fL — ABNORMAL HIGH (ref 80.0–100.0)
Platelets: 276 10*3/uL (ref 150–400)
RBC: 3.58 MIL/uL — ABNORMAL LOW (ref 3.87–5.11)
RDW: 15.1 % (ref 11.5–15.5)
WBC: 13.3 10*3/uL — ABNORMAL HIGH (ref 4.0–10.5)
nRBC: 0 % (ref 0.0–0.2)

## 2022-10-21 LAB — COMPREHENSIVE METABOLIC PANEL
ALT: 12 U/L (ref 0–44)
AST: 13 U/L — ABNORMAL LOW (ref 15–41)
Albumin: 2.6 g/dL — ABNORMAL LOW (ref 3.5–5.0)
Alkaline Phosphatase: 69 U/L (ref 38–126)
Anion gap: 9 (ref 5–15)
BUN: 25 mg/dL — ABNORMAL HIGH (ref 8–23)
CO2: 22 mmol/L (ref 22–32)
Calcium: 8.7 mg/dL — ABNORMAL LOW (ref 8.9–10.3)
Chloride: 107 mmol/L (ref 98–111)
Creatinine, Ser: 1.9 mg/dL — ABNORMAL HIGH (ref 0.44–1.00)
GFR, Estimated: 26 mL/min — ABNORMAL LOW (ref >60)
Glucose, Bld: 228 mg/dL — ABNORMAL HIGH (ref 70–99)
Potassium: 4.2 mmol/L (ref 3.5–5.1)
Sodium: 138 mmol/L (ref 135–145)
Total Bilirubin: 0.3 mg/dL (ref 0.3–1.2)
Total Protein: 6.6 g/dL (ref 6.5–8.1)

## 2022-10-21 LAB — I-STAT CG4 LACTIC ACID, ED
Lactic Acid, Venous: 2.3 mmol/L (ref 0.5–1.9)
Lactic Acid, Venous: 1.5 mmol/L (ref 0.5–1.9)

## 2022-10-21 LAB — TROPONIN I (HIGH SENSITIVITY)
Troponin I (High Sensitivity): 11 ng/L (ref <18)
Troponin I (High Sensitivity): 11 ng/L (ref <18)

## 2022-10-21 LAB — BRAIN NATRIURETIC PEPTIDE: B Natriuretic Peptide: 399.9 pg/mL — ABNORMAL HIGH (ref 0.0–100.0)

## 2022-10-21 LAB — GLUCOSE, CAPILLARY: Glucose-Capillary: 206 mg/dL — ABNORMAL HIGH (ref 70–99)

## 2022-10-21 LAB — HEMOGLOBIN A1C
Hgb A1c MFr Bld: 7.6 % — ABNORMAL HIGH (ref 4.8–5.6)
Mean Plasma Glucose: 171.42 mg/dL

## 2022-10-21 LAB — LIPASE, BLOOD: Lipase: 35 U/L (ref 11–51)

## 2022-10-21 MED ORDER — ACETAMINOPHEN 325 MG PO TABS
650.0000 mg | ORAL_TABLET | Freq: Four times a day (QID) | ORAL | Status: DC | PRN
  Administered 2022-10-22: 650 mg via ORAL
  Filled 2022-10-21: qty 2

## 2022-10-21 MED ORDER — EMPAGLIFLOZIN 10 MG PO TABS
10.0000 mg | ORAL_TABLET | Freq: Every day | ORAL | Status: DC
  Administered 2022-10-22 – 2022-10-24 (×3): 10 mg via ORAL
  Filled 2022-10-21 (×3): qty 1

## 2022-10-21 MED ORDER — SODIUM CHLORIDE 0.9% FLUSH
3.0000 mL | Freq: Two times a day (BID) | INTRAVENOUS | Status: DC
  Administered 2022-10-21 – 2022-10-23 (×3): 3 mL via INTRAVENOUS

## 2022-10-21 MED ORDER — ONDANSETRON HCL 4 MG/2ML IJ SOLN: 4.0000 mg | Freq: Once | INTRAMUSCULAR | Status: DC | PRN

## 2022-10-21 MED ORDER — FENTANYL CITRATE PF 50 MCG/ML IJ SOSY
50.0000 ug | PREFILLED_SYRINGE | Freq: Once | INTRAMUSCULAR | Status: AC
  Administered 2022-10-21: 50 ug via INTRAVENOUS
  Filled 2022-10-21: qty 1

## 2022-10-21 MED ORDER — GUAIFENESIN ER 600 MG PO TB12
600.0000 mg | ORAL_TABLET | Freq: Two times a day (BID) | ORAL | Status: DC
  Administered 2022-10-21 – 2022-10-26 (×10): 600 mg via ORAL
  Filled 2022-10-21 (×11): qty 1

## 2022-10-21 MED ORDER — INSULIN ASPART 100 UNIT/ML IJ SOLN
0.0000 [IU] | Freq: Three times a day (TID) | INTRAMUSCULAR | Status: DC
  Administered 2022-10-23 – 2022-10-24 (×2): 1 [IU] via SUBCUTANEOUS
  Administered 2022-10-25 (×2): 2 [IU] via SUBCUTANEOUS
  Administered 2022-10-25: 1 [IU] via SUBCUTANEOUS
  Administered 2022-10-26: 2 [IU] via SUBCUTANEOUS

## 2022-10-21 MED ORDER — HYDRALAZINE HCL 25 MG PO TABS
25.0000 mg | ORAL_TABLET | Freq: Three times a day (TID) | ORAL | Status: DC
  Administered 2022-10-21: 25 mg via ORAL
  Filled 2022-10-21 (×2): qty 1

## 2022-10-21 MED ORDER — FENTANYL CITRATE PF 50 MCG/ML IJ SOSY: 25.0000 ug | PREFILLED_SYRINGE | INTRAMUSCULAR | Status: DC | PRN

## 2022-10-21 MED ORDER — ALLOPURINOL 300 MG PO TABS
300.0000 mg | ORAL_TABLET | Freq: Every day | ORAL | Status: DC
  Administered 2022-10-21 – 2022-10-26 (×6): 300 mg via ORAL
  Filled 2022-10-21 (×6): qty 1

## 2022-10-21 MED ORDER — HYDRALAZINE HCL 20 MG/ML IJ SOLN: 10.0000 mg | INTRAMUSCULAR | Status: DC | PRN

## 2022-10-21 MED ORDER — ACETAMINOPHEN 650 MG RE SUPP: 650.0000 mg | Freq: Four times a day (QID) | RECTAL | Status: DC | PRN

## 2022-10-21 MED ORDER — FUROSEMIDE 10 MG/ML IJ SOLN
40.0000 mg | Freq: Two times a day (BID) | INTRAMUSCULAR | Status: DC
  Administered 2022-10-21 (×2): 40 mg via INTRAVENOUS
  Filled 2022-10-21 (×2): qty 4

## 2022-10-21 MED ORDER — SODIUM CHLORIDE 0.9 % IV SOLN
2.0000 g | Freq: Once | INTRAVENOUS | Status: AC
  Administered 2022-10-21: 2 g via INTRAVENOUS
  Filled 2022-10-21: qty 20

## 2022-10-21 MED ORDER — METRONIDAZOLE 500 MG/100ML IV SOLN
500.0000 mg | Freq: Once | INTRAVENOUS | Status: AC
  Administered 2022-10-21: 500 mg via INTRAVENOUS
  Filled 2022-10-21: qty 100

## 2022-10-21 MED ORDER — ATORVASTATIN CALCIUM 40 MG PO TABS
40.0000 mg | ORAL_TABLET | Freq: Every day | ORAL | Status: DC
  Administered 2022-10-21 – 2022-10-26 (×6): 40 mg via ORAL
  Filled 2022-10-21 (×6): qty 1

## 2022-10-21 MED ORDER — HYDROMORPHONE HCL 1 MG/ML IJ SOLN
0.5000 mg | INTRAMUSCULAR | Status: DC | PRN
  Administered 2022-10-21: 1 mg via INTRAVENOUS
  Administered 2022-10-23 (×2): 0.5 mg via INTRAVENOUS
  Administered 2022-10-23 – 2022-10-24 (×4): 1 mg via INTRAVENOUS
  Filled 2022-10-21 (×7): qty 1

## 2022-10-21 MED ORDER — HEPARIN SODIUM (PORCINE) 5000 UNIT/ML IJ SOLN
5000.0000 [IU] | Freq: Three times a day (TID) | INTRAMUSCULAR | Status: DC
  Administered 2022-10-21 – 2022-10-26 (×11): 5000 [IU] via SUBCUTANEOUS
  Filled 2022-10-21 (×11): qty 1

## 2022-10-21 MED ORDER — SODIUM CHLORIDE 0.9 % IV SOLN
2.0000 g | INTRAVENOUS | Status: DC
  Administered 2022-10-22 – 2022-10-25 (×4): 2 g via INTRAVENOUS
  Filled 2022-10-21 (×4): qty 20

## 2022-10-21 MED ORDER — SODIUM CHLORIDE 0.9 % IV SOLN
1.0000 g | Freq: Once | INTRAVENOUS | Status: AC
  Administered 2022-10-21: 1 g via INTRAVENOUS
  Filled 2022-10-21: qty 10

## 2022-10-21 MED ORDER — ONDANSETRON HCL 4 MG/2ML IJ SOLN
4.0000 mg | Freq: Once | INTRAMUSCULAR | Status: AC | PRN
  Administered 2022-10-21: 4 mg via INTRAVENOUS
  Filled 2022-10-21: qty 2

## 2022-10-21 MED ORDER — ISOSORBIDE MONONITRATE ER 30 MG PO TB24
15.0000 mg | ORAL_TABLET | Freq: Every day | ORAL | Status: DC
  Administered 2022-10-21: 15 mg via ORAL
  Filled 2022-10-21: qty 1

## 2022-10-21 MED ORDER — ALBUTEROL SULFATE (2.5 MG/3ML) 0.083% IN NEBU: 2.5000 mg | INHALATION_SOLUTION | Freq: Four times a day (QID) | RESPIRATORY_TRACT | Status: DC | PRN

## 2022-10-21 NOTE — ED Provider Notes (Signed)
[facility] AT [facility] Provider Note   CSN: [id number] Arrival date & time: 10/21/22  0157     History  Chief Complaint  Patient presents with   Abdominal Pain    Connie Swanson is a 82 y.o. female.  The history is provided by the patient and the EMS personnel.  Abdominal Pain Connie Swanson is a 82 y.o. female who presents to the Emergency Department complaining of dental pain.  She presents to the emergency department by EMS for evaluation of right upper quadrant abdominal pain that started at 11 PM.  Pain is described as severe and constant with associated nausea and vomiting.  No prior similar symptoms.  She has been sick with an upper respiratory infection for the last week and a half.  She states that her breathing is at her baseline but she does have a cough.  No fevers.  No diarrhea.  No prior similar symptoms. She was treated with nitroglycerin x 2 as well as 4 mg of Zofran by EMS without improvement in her symptoms.    Home Medications Prior to Admission medications   Medication Sig Start Date End Date Taking? Authorizing Provider  acetaminophen (TYLENOL) 325 MG tablet Take 2 tablets (650 mg total) by mouth every 4 (four) hours as needed for headache or mild pain. 10/25/21   Sherie Don, NP  allopurinol (ZYLOPRIM) 300 MG tablet Take 300 mg by mouth daily.    [provider]  atorvastatin (LIPITOR) 40 MG tablet Take 40 mg by mouth daily. 03/04/18   [provider]  B-D INS SYR ULTRAFINE 1CC/30G 30G X 1/2" 1 ML MISC USE SYRINGE AS DIRECTED TWICE DAILY 08/26/19   [provider]  bumetanide (BUMEX) 0.5 MG tablet Take 1 tablet (0.5 mg total) by mouth daily as needed. 05/25/22   Tolia, Sunit, DO  calcitRIOL (ROCALTROL) 0.25 MCG capsule Take 0.25 mcg by mouth every other day. Mon, Wed, Fri    [provider]  Cholecalciferol (VITAMIN D3) 125 MCG (5000 UT) TABS Take 5,000 Units by mouth daily at 12 noon.    [provider]  denosumab (PROLIA) 60 MG/ML SOSY injection Inject 60 mg into the skin every 6 (six) months.    [provider]  empagliflozin (JARDIANCE) 10 MG TABS tablet Take 1 tablet (10 mg total) by mouth daily before breakfast. 11/09/21   Byrd Hesselbach, RPH  hydrALAZINE (APRESOLINE) 25 MG tablet Take 1 tablet (25 mg total) by mouth 3 (three) times daily. 04/30/22   Tolia, Sunit, DO  isosorbide mononitrate (IMDUR) 30 MG 24 hr tablet Take 1/2 (one-half) tablet by mouth once daily 07/10/22   Tolia, Sunit, DO  metoprolol succinate (TOPROL-XL) 50 MG 24 hr tablet Take 1 tablet (50 mg total) by mouth daily. Take with or immediately following a meal. 10/26/21   Sherie Don, NP  sacubitril-valsartan (ENTRESTO) 24-26 MG Take 1 tablet by mouth 2 (two) times daily.    [provider]  senna (SENOKOT) 8.6 MG tablet Take 1 tablet by mouth daily.    [provider]      Allergies    Dapagliflozin    Review of Systems   Review of Systems  Gastrointestinal:  Positive for abdominal pain.  All other systems reviewed and are negative.   Physical Exam Updated Vital Signs BP (!) 181/74   Pulse 76   Temp 98.7 F (37.1 C) (Oral)   Resp (!) 21   Ht 5\' 1"  (1.549  m)   Wt 89.8 kg   SpO2 99%   BMI 37.41 kg/m  Physical Exam Vitals and nursing note reviewed.  Constitutional:      General: She is in acute distress.     Appearance: She is well-developed. She is ill-appearing.  HENT:     Head: Normocephalic and atraumatic.  Cardiovascular:     Rate and Rhythm: Normal rate and regular rhythm.     Heart sounds: No murmur heard. Pulmonary:     Comments: Crackles in bilateral bases, tachypnea Abdominal:     Palpations: Abdomen is soft.     Tenderness: There is no abdominal tenderness. There is no guarding or rebound.     Comments: Mild right upper quadrant tenderness  Musculoskeletal:        General: No swelling or tenderness.  Skin:    General: Skin is warm and dry.      Coloration: Skin is pale.  Neurological:     Mental Status: She is alert and oriented to person, place, and time.  Psychiatric:        Behavior: Behavior normal.     ED Results / Procedures / Treatments   Labs (all labs ordered are listed, but only abnormal results are displayed) Labs Reviewed  COMPREHENSIVE METABOLIC PANEL - Abnormal; Notable for the following components:      Result Value   Glucose, Bld 228 (*)    BUN 25 (*)    Creatinine, Ser 1.90 (*)    Calcium 8.7 (*)    Albumin 2.6 (*)    AST 13 (*)    GFR, Estimated 26 (*)    All other components within normal limits  CBC - Abnormal; Notable for the following components:   WBC 13.3 (*)    RBC 3.58 (*)    Hemoglobin 11.4 (*)    MCV 101.4 (*)    All other components within normal limits  URINALYSIS, ROUTINE W REFLEX MICROSCOPIC - Abnormal; Notable for the following components:   Color, Urine STRAW (*)    Glucose, UA >=500 (*)    Protein, ur 100 (*)    All other components within normal limits  BRAIN NATRIURETIC PEPTIDE - Abnormal; Notable for the following components:   B Natriuretic Peptide 399.9 (*)    All other components within normal limits  I-STAT CG4 LACTIC ACID, ED - Abnormal; Notable for the following components:   Lactic Acid, Venous 2.3 (*)    All other components within normal limits  CULTURE, BLOOD (ROUTINE X 2)  CULTURE, BLOOD (ROUTINE X 2)  LIPASE, BLOOD  I-STAT CG4 LACTIC ACID, ED  TROPONIN I (HIGH SENSITIVITY)  TROPONIN I (HIGH SENSITIVITY)    EKG EKG Interpretation Date/Time:  Sunday October 21 2022 02:05:54 EDT Ventricular Rate:  76 PR Interval:  275 QRS Duration:  136 QT Interval:  464 QTC Calculation: 522 R Axis:   -43  Text Interpretation: Atrial-sensed ventricular-paced rhythm Atrial-sensed ventricular-paced rhythm Confirmed by Tilden Fossa [phone]) on 10/21/2022 2:35:19 AM  Radiology US Abdomen Limited RUQ (LIVER/GB)  Result Date: 10/21/2022 CLINICAL DATA:  82 year old  female with history of right upper quadrant abdominal pain. EXAM: ULTRASOUND ABDOMEN LIMITED RIGHT UPPER QUADRANT COMPARISON:  None Available. FINDINGS: Gallbladder: No gallstone confidently identified. Gallbladder is only moderately distended. Gallbladder wall thickness is slightly prominent (3.5 mm). No pericholecystic fluid. Per report from the sonographer, there was no sonographic Murphy's sign on examination. Common bile duct: Diameter: 9.3 mm proximally and up to 15 mm distally. Liver: No focal  lesion identified. Within normal limits in parenchymal echogenicity allowing for body habitus. Portal vein is patent on color Doppler imaging with normal direction of blood flow towards the liver. Other: None. IMPRESSION: 1. Dilatation of the common bile duct. Previously noted gallstone is not readily apparent in the lumen of the gallbladder on today's ultrasound. The possibility of a recently migrated stone with biliary obstruction should be considered. Further evaluation with abdominal MRI with and without IV gadolinium with MRCP should be considered to exclude obstructive choledocholithiasis in the appropriate clinical setting. Electronically Signed   By: Trudie Reed M.D.   On: 10/21/2022 06:14   CT ABDOMEN PELVIS WO CONTRAST  Result Date: 10/21/2022 CLINICAL DATA:  Right upper quadrant abdominal pain with nausea and vomiting. EXAM: CT ABDOMEN AND PELVIS WITHOUT CONTRAST TECHNIQUE: Multidetector CT imaging of the abdomen and pelvis was performed following the standard protocol without IV contrast. RADIATION DOSE REDUCTION: This exam was performed according to the departmental dose-optimization program which includes automated exposure control, adjustment of the mA and/or kV according to patient size and/or use of iterative reconstruction technique. COMPARISON:  None Available. FINDINGS: Lower chest: The heart is enlarged and there is no pericardial effusion. Pacemaker leads are present in the heart. There  is calcification of the mitral valve. Coronary artery calcifications are noted. Scattered airspace opacities are noted at the lung bases. Hepatobiliary: No focal liver abnormality is seen. No biliary ductal dilatation. A stone is present within the gallbladder. There is suggestion of mild gallbladder wall thickening. Pancreas: Unremarkable. No pancreatic ductal dilatation or surrounding inflammatory changes. Spleen: Normal in size without focal abnormality. Adrenals/Urinary Tract: The adrenal glands are within normal limits. No renal calculus or hydronephrosis. The bladder is unremarkable. Stomach/Bowel: Stomach is within normal limits. Appendix is not seen. No evidence of bowel wall thickening, distention, or inflammatory changes. No free air or pneumatosis. Scattered diverticula are present along the colon without evidence of diverticulitis. Vascular/Lymphatic: Aortic atherosclerosis. No enlarged abdominal or pelvic lymph nodes. Reproductive: Status post hysterectomy. No adnexal masses. Other: No abdominopelvic ascites. Musculoskeletal: Bilateral pars defects are noted at L5 with mild anterolisthesis at L5-S1. Degenerative changes are noted in the thoracolumbar spine. No acute osseous abnormality. IMPRESSION: 1. Cholelithiasis with a suggestion of gallbladder wall thickening. Ultrasound is recommended for further evaluation. 2. Scattered airspace opacities at the lung bases, possible atelectasis or infiltrate. 3. Diverticulosis without diverticulitis. 4. Cardiomegaly with coronary artery calcifications. 5. Aortic atherosclerosis. Electronically Signed   By: Thornell Sartorius M.D.   On: 10/21/2022 04:49   DG Chest 1 View  Result Date: 10/21/2022 CLINICAL DATA:  Abdominal pain with bilateral rales. EXAM: CHEST  1 VIEW COMPARISON:  October 25, 2021 FINDINGS: There is stable dual lead AICD positioning. The cardiac silhouette is mildly enlarged and unchanged in size. Moderate severity diffusely increased interstitial  lung markings are noted. Mild areas of superimposed atelectasis and/or infiltrate are seen within the right upper lobe and bilateral lung bases. No pleural effusion or pneumothorax is identified. The visualized skeletal structures are unremarkable. IMPRESSION: Moderate severity interstitial edema with mild areas of superimposed right upper lobe and bibasilar atelectasis and/or infiltrate. Electronically Signed   By: Aram Candela M.D.   On: 10/21/2022 02:48    Procedures Procedures    Medications Ordered in ED Medications  metroNIDAZOLE (FLAGYL) IVPB 500 mg (500 mg Intravenous New Bag/Given 10/21/22 0557)  ondansetron (ZOFRAN) injection 4 mg (4 mg Intravenous Given 10/21/22 0248)  fentaNYL (SUBLIMAZE) injection 50 mcg (50 mcg Intravenous Given 10/21/22  0247)  fentaNYL (SUBLIMAZE) injection 50 mcg (50 mcg Intravenous Given 10/21/22 0338)  cefTRIAXone (ROCEPHIN) 1 g in sodium chloride 0.9 % 100 mL IVPB (0 g Intravenous Stopped 10/21/22 0549)    ED Course/ Medical Decision Making/ A&P                                 Medical Decision Making Amount and/or Complexity of Data Reviewed Labs: ordered. Radiology: ordered.  Risk Prescription drug management. Decision regarding hospitalization.   Patient with history of hypertension, CHF, CKD, diabetes, complete heart block status post pacemaker placement here for evaluation of abdominal pain.  She has been recently started on Augmentin for upper respiratory infection.  Patient is ill-appearing on evaluation.  She appears to be in pain with tachypnea, bibasilar crackles as well as right upper quadrant tenderness.  Chest x-ray is concerning for possible infiltrate, she was started on antibiotics.  She does have leukocytosis on her CBC.  CT scan was obtained, which demonstrates infiltrates in the bases as well as possible cholecystitis.  Right upper quadrant ultrasound was obtained, which is concerning for biliary dilation and possible  obstruction.  Given imaging findings, medicine consulted for admission.  Pt has a pacemaker, which limits ability to obtain MRI at this time.  D/w Dr. Dossie Der with general surgery - surgery will see her in consult. Medicine consulted for admission.  Patient and family at bedside updated of findings of studies and recommendation for admission and they are in agreement with plan.  Patient's pain is improved after IV pain meds.  Initial lactic acid mildly elevated, improved on recheck.         Final Clinical Impression(s) / ED Diagnoses Final diagnoses:  Community acquired pneumonia of right lower lobe of lung  Cholecystitis    Rx / DC Orders ED Discharge Orders     None         Tilden Fossa, MD 10/21/22 [phone]

## 2022-10-21 NOTE — ED Notes (Signed)
MRI called and advised that due to patient having AICD, she would have to wait until tomorrow for MRI.

## 2022-10-21 NOTE — Significant Event (Signed)
Rapid Response Event Note   Reason for Call :  Decreased LOC.  Per charting, pt was alert and oriented at 1405. She was given 1mg  dilaudid at 1752. Per night shift RN, she has been sleeping since the start of her shift.   Initial Focused Assessment:  Pt sitting up in bed with eyes closed. She is in no visible distress. She will not open her eyes but  does squeeze hands and wiggle her toes to command. She will answer "uh huh" to some simple questions but will not speak otherwise. Pupils 3, equal, and reactive.  T-97.7, HR-69, BP-126/49, RR-16, SpO2-97% on 2L [place]  Interventions:  CBG-206  Plan of Care:  VSS. Pt very sleepy however able to follow commands and answer some questions. Continue to monitor pt closely. Call RRT if further assistance needed.   Event Summary:   MD Notified:  Dr. Kirke Corin Call Time:2306 Arrival [id number] End [id number]  Terrilyn Saver, RN

## 2022-10-21 NOTE — ED Provider Notes (Signed)
7:23 AM Care assumed from Dr. Madilyn Hook.  At time of transfer care, patient is waiting for medicine team to admit for acute cholecystitis in the setting of pneumonia and CHF.  Surgery will see and medicine team was called overnight and will have the day team see to admit.   [name], Canary Brim, MD 10/21/22 1114

## 2022-10-21 NOTE — ED Triage Notes (Signed)
PT states that about 2 hours ago she began having right upper quadrant pain with nausea and vomiting.Pt appears short of breath but states this is her normal. EMS appreciated rales bil and administered x2 nitroglycerin subunguially as well as 4mg  zofran. Pt was placed on 4l oxygen. RA sat 100%.

## 2022-10-21 NOTE — ED Notes (Signed)
ED TO INPATIENT HANDOFF REPORT  ED Nurse Name and Phone #: CAt  S Name/Age/Gender Connie Swanson 82 y.o. female Room/Bed: 042C/042C  Code Status   Code Status: Full Code  Home/SNF/Other Home Patient oriented to: self, place, time, and situation Is this baseline? Yes   Triage Complete: Triage complete  Chief Complaint Choledocholithiasis [K80.50]  Triage Note PT states that about 2 hours ago she began having right upper quadrant pain with nausea and vomiting.Pt appears short of breath but states this is her normal. EMS appreciated rales bil and administered x2 nitroglycerin subunguially as well as 4mg  zofran. Pt was placed on 4l oxygen. RA sat 100%.   Allergies Allergies  Allergen Reactions   Dapagliflozin Nausea And Vomiting    Farxiga    Level of Care/Admitting Diagnosis ED Disposition     ED Disposition  Admit   Condition  --   Comment  Hospital Area: [facility] [[id number]]  Level of Care: Telemetry Surgical [105]  May admit patient to [facility] or [facility] if equivalent level of care is available:: No  Covid Evaluation: Asymptomatic - no recent exposure (last 10 days) testing not required  Diagnosis: Choledocholithiasis [161096]  Admitting Physician: Clydie Braun [[id number]]  Attending Physician: Clydie Braun [[id number]]  Certification:: I certify this patient will need inpatient services for at least 2 midnights  Expected Medical Readiness: 10/23/2022          B Medical/Surgery History Past Medical History:  Diagnosis Date   CHF (congestive heart failure) (HCC)    Chronic kidney disease    Complete heart block (HCC)    Coronary artery disease    Diabetes mellitus without complication (HCC)    Hyperlipidemia    Hypertension    Pacemaker Medtronic dual chamber Pacemaker Assurity Dr-Rf 10/24/2021 11/29/2021   Past Surgical History:  Procedure Laterality Date   BREAST EXCISIONAL BIOPSY Right    HEMORRHOID SURGERY      MENISCUS REPAIR     PACEMAKER IMPLANT N/A 10/24/2021   Procedure: PACEMAKER IMPLANT;  Surgeon: Lanier Prude, MD;  Location: [facility]VASIVE [facility];  Service: Cardiovascular;  Laterality: N/A;   THYROID SURGERY     VAGINAL HYSTERECTOMY       A IV Location/Drains/Wounds Patient Lines/Drains/Airways Status     Active Line/Drains/Airways     Name Placement date Placement time Site Days   Peripheral IV 10/21/22 18 G 1" Left Antecubital 10/21/22  0138  Antecubital  less than 1            Intake/Output Last 24 hours  Intake/Output Summary (Last 24 hours) at 10/21/2022 1123 Last data filed at 10/21/2022 0719 Gross per 24 hour  Intake 100 ml  Output 700 ml  Net -600 ml    Labs/Imaging Results for orders placed or performed during the hospital encounter of 10/21/22 (from the past 48 hour(s))  Lipase, blood     Status: None   Collection Time: 10/21/22  2:16 AM  Result Value Ref Range   Lipase 35 11 - 51 U/L    Comment: Performed at [facility], [address], [place], [place] [address]  Comprehensive metabolic panel     Status: Abnormal   Collection Time: 10/21/22  2:16 AM  Result Value Ref Range   Sodium 138 135 - 145 mmol/L   Potassium 4.2 3.5 - 5.1 mmol/L   [place] 107 98 - 111 mmol/L   CO2 22 22 - 32 mmol/L   Glucose,  Bld 228 (H) 70 - 99 mg/dL    Comment: Glucose reference range applies only to samples taken after fasting for at least 8 hours.   BUN 25 (H) 8 - 23 mg/dL   Creatinine, Ser 1.61 (H) 0.44 - 1.00 mg/dL   Calcium 8.7 (L) 8.9 - 10.3 mg/dL   Total Protein 6.6 6.5 - 8.1 g/dL   Albumin 2.6 (L) 3.5 - 5.0 g/dL   AST 13 (L) 15 - 41 U/L   ALT 12 0 - 44 U/L   Alkaline Phosphatase 69 38 - 126 U/L   Total Bilirubin 0.3 0.3 - 1.2 mg/dL   GFR, Estimated 26 (L) >60 mL/min    Comment: (NOTE) Calculated using the CKD-EPI Creatinine Equation (2021)    Anion gap 9 5 - 15    Comment: Performed at [facility], [address] [place], [place] [address]   CBC     Status: Abnormal   Collection Time: 10/21/22  2:16 AM  Result Value Ref Range   WBC 13.3 (H) 4.0 - 10.5 K/uL   RBC 3.58 (L) 3.87 - 5.11 MIL/uL   Hemoglobin 11.4 (L) 12.0 - 15.0 g/dL   HCT 54.0 98.1 - 19.1 %   MCV 101.4 (H) 80.0 - 100.0 fL   MCH 31.8 26.0 - 34.0 pg   MCHC 31.4 30.0 - 36.0 g/dL   RDW 47.8 29.5 - 62.1 %   Platelets 276 150 - 400 K/uL   nRBC 0.0 0.0 - 0.2 %    Comment: Performed at [facility], [address], [place], [place] [address]  Brain natriuretic peptide     Status: Abnormal   Collection Time: 10/21/22  2:16 AM  Result Value Ref Range   B Natriuretic Peptide 399.9 (H) 0.0 - 100.0 pg/mL    Comment: Performed at [facility], [address], [place], [place] [address]  Troponin I (High Sensitivity)     Status: None   Collection Time: 10/21/22  2:16 AM  Result Value Ref Range   Troponin I (High Sensitivity) 11 <18 ng/L    Comment: (NOTE) Elevated high sensitivity troponin I (hsTnI) values and significant  changes across serial measurements may suggest ACS but many other  chronic and acute conditions are known to elevate hsTnI results.  Refer to the "Links" section for chest pain algorithms and additional  guidance. Performed at [facility], [address], [place], [place] [address]   Urinalysis, Routine w reflex microscopic -Urine, Clean Catch     Status: Abnormal   Collection Time: 10/21/22  2:17 AM  Result Value Ref Range   Color, Urine STRAW (A) YELLOW   APPearance CLEAR CLEAR   Specific Gravity, Urine 1.008 1.005 - 1.030   pH 6.0 5.0 - 8.0   Glucose, UA >=500 (A) NEGATIVE mg/dL   Hgb urine dipstick NEGATIVE NEGATIVE   Bilirubin Urine NEGATIVE NEGATIVE   Ketones, ur NEGATIVE NEGATIVE mg/dL   Protein, ur 841 (A) NEGATIVE mg/dL   Nitrite NEGATIVE NEGATIVE   Leukocytes,Ua NEGATIVE NEGATIVE   RBC / HPF 0-5 0 - 5 RBC/hpf   WBC, UA 0-5 0 - 5 WBC/hpf   Bacteria, UA NONE SEEN NONE SEEN   Squamous Epithelial / HPF 0-5 0 - 5  /HPF    Comment: Performed at [facility], [address] [place], [place] [address]  I-Stat Lactic Acid     Status: Abnormal   Collection Time: 10/21/22  3:19 AM  Result Value Ref Range   Lactic Acid, Venous 2.3 (HH) 0.5 - 1.9 mmol/L   Comment NOTIFIED PHYSICIAN   Culture, blood (routine x 2)     Status: None (Preliminary result)   Collection Time: 10/21/22  3:40 AM   Specimen: BLOOD RIGHT ARM  Result Value Ref Range   Specimen Description BLOOD RIGHT ARM    Special Requests      BOTTLES DRAWN AEROBIC AND ANAEROBIC Blood Culture adequate volume   Culture      NO GROWTH < 12 HOURS Performed at [facility], [address], [place], [place] [address]    Report Status PENDING   Culture, blood (routine x 2)     Status: None (Preliminary result)   Collection Time: 10/21/22  3:50 AM   Specimen: BLOOD RIGHT HAND  Result Value Ref Range   Specimen Description BLOOD RIGHT HAND    Special Requests      BOTTLES DRAWN AEROBIC AND ANAEROBIC Blood Culture results may not be optimal due to an inadequate volume of blood received in culture bottles   Culture      NO GROWTH < 12 HOURS Performed at [facility], [address] [place], [place] [address]    Report Status PENDING   Troponin I (High Sensitivity)     Status: None   Collection Time: 10/21/22  4:26 AM  Result Value Ref Range   Troponin I (High Sensitivity) 11 <18 ng/L    Comment: (NOTE) Elevated high sensitivity troponin I (hsTnI) values and significant  changes across serial measurements may suggest ACS but many other  chronic and acute conditions are known to elevate hsTnI results.  Refer to the "Links" section for chest pain algorithms and additional  guidance. Performed at [facility], [address], [place], [place] [address]   I-Stat Lactic Acid     Status: None   Collection Time: 10/21/22  5:32 AM  Result Value Ref Range   Lactic Acid, Venous 1.5 0.5 - 1.9 mmol/L   US Abdomen Limited RUQ  (LIVER/GB)  Result Date: 10/21/2022 CLINICAL DATA:  82 year old female with history of right upper quadrant abdominal pain. EXAM: ULTRASOUND ABDOMEN LIMITED RIGHT UPPER QUADRANT COMPARISON:  None Available. FINDINGS: Gallbladder: No gallstone confidently identified. Gallbladder is only moderately distended. Gallbladder wall thickness is slightly prominent (3.5 mm). No pericholecystic fluid. Per report from the sonographer, there was no sonographic Murphy's sign on examination. Common bile duct: Diameter: 9.3 mm proximally and up to 15 mm distally. Liver: No focal lesion identified. Within normal limits in parenchymal echogenicity allowing for body habitus. Portal vein is patent on color Doppler imaging with normal direction of blood flow towards the liver. Other: None. IMPRESSION: 1. Dilatation of the common bile duct. Previously noted gallstone is not readily apparent in the lumen of the gallbladder on today's ultrasound. The possibility of a recently migrated stone with biliary obstruction should be considered. Further evaluation with abdominal MRI with and without IV gadolinium with MRCP should be considered to exclude obstructive choledocholithiasis in the appropriate clinical setting. Electronically Signed   By: Trudie Reed M.D.   On: 10/21/2022 06:14   CT ABDOMEN PELVIS WO CONTRAST  Result Date: 10/21/2022 CLINICAL DATA:  Right upper quadrant abdominal pain with nausea and vomiting. EXAM: CT ABDOMEN AND PELVIS WITHOUT CONTRAST TECHNIQUE: Multidetector CT imaging of the abdomen and pelvis was performed following the standard protocol without IV contrast. RADIATION DOSE REDUCTION: This exam was performed according to the  departmental dose-optimization program which includes automated exposure control, adjustment of the mA and/or kV according to patient size and/or use of iterative reconstruction technique. COMPARISON:  None Available. FINDINGS: Lower chest: The heart is enlarged and there is no  pericardial effusion. Pacemaker leads are present in the heart. There is calcification of the mitral valve. Coronary artery calcifications are noted. Scattered airspace opacities are noted at the lung bases. Hepatobiliary: No focal liver abnormality is seen. No biliary ductal dilatation. A stone is present within the gallbladder. There is suggestion of mild gallbladder wall thickening. Pancreas: Unremarkable. No pancreatic ductal dilatation or surrounding inflammatory changes. Spleen: Normal in size without focal abnormality. Adrenals/Urinary Tract: The adrenal glands are within normal limits. No renal calculus or hydronephrosis. The bladder is unremarkable. Stomach/Bowel: Stomach is within normal limits. Appendix is not seen. No evidence of bowel wall thickening, distention, or inflammatory changes. No free air or pneumatosis. Scattered diverticula are present along the colon without evidence of diverticulitis. Vascular/Lymphatic: Aortic atherosclerosis. No enlarged abdominal or pelvic lymph nodes. Reproductive: Status post hysterectomy. No adnexal masses. Other: No abdominopelvic ascites. Musculoskeletal: Bilateral pars defects are noted at L5 with mild anterolisthesis at L5-S1. Degenerative changes are noted in the thoracolumbar spine. No acute osseous abnormality. IMPRESSION: 1. Cholelithiasis with a suggestion of gallbladder wall thickening. Ultrasound is recommended for further evaluation. 2. Scattered airspace opacities at the lung bases, possible atelectasis or infiltrate. 3. Diverticulosis without diverticulitis. 4. Cardiomegaly with coronary artery calcifications. 5. Aortic atherosclerosis. Electronically Signed   By: Thornell Sartorius M.D.   On: 10/21/2022 04:49   DG Chest 1 View  Result Date: 10/21/2022 CLINICAL DATA:  Abdominal pain with bilateral rales. EXAM: CHEST  1 VIEW COMPARISON:  October 25, 2021 FINDINGS: There is stable dual lead AICD positioning. The cardiac silhouette is mildly enlarged and  unchanged in size. Moderate severity diffusely increased interstitial lung markings are noted. Mild areas of superimposed atelectasis and/or infiltrate are seen within the right upper lobe and bilateral lung bases. No pleural effusion or pneumothorax is identified. The visualized skeletal structures are unremarkable. IMPRESSION: Moderate severity interstitial edema with mild areas of superimposed right upper lobe and bibasilar atelectasis and/or infiltrate. Electronically Signed   By: Aram Candela M.D.   On: 10/21/2022 02:48    Pending Labs Unresulted Labs (From admission, onward)     Start     Ordered   10/22/22 0500  CBC  Tomorrow morning,   R        10/21/22 0935   10/22/22 0500  Basic metabolic panel  Tomorrow morning,   R        10/21/22 0935            Vitals/Pain Today's Vitals   10/21/22 0715 10/21/22 0800 10/21/22 0811 10/21/22 1100  BP: (!) 183/81 (!) 174/71  (!) 156/121  Pulse: 63 63  66  Resp: 19 17  (!) 24  Temp:    98 F (36.7 C)  TempSrc:      SpO2: 100% 100% 100% 100%  Weight:      Height:      PainSc:        Isolation Precautions No active isolations  Medications Medications  heparin injection 5,000 Units (5,000 Units Subcutaneous Given 10/21/22 1042)  sodium chloride flush (NS) 0.9 % injection 3 mL (3 mLs Intravenous Not Given 10/21/22 1003)  acetaminophen (TYLENOL) tablet 650 mg (has no administration in time range)    Or  acetaminophen (TYLENOL) suppository 650 mg (has no administration  in time range)  albuterol (PROVENTIL) (2.5 MG/3ML) 0.083% nebulizer solution 2.5 mg (has no administration in time range)  hydrALAZINE (APRESOLINE) injection 10 mg (has no administration in time range)  furosemide (LASIX) injection 40 mg (40 mg Intravenous Given 10/21/22 1042)  cefTRIAXone (ROCEPHIN) 2 g in sodium chloride 0.9 % 100 mL IVPB (has no administration in time range)  ondansetron (ZOFRAN) injection 4 mg (4 mg Intravenous Given 10/21/22 0248)  fentaNYL  (SUBLIMAZE) injection 50 mcg (50 mcg Intravenous Given 10/21/22 0247)  fentaNYL (SUBLIMAZE) injection 50 mcg (50 mcg Intravenous Given 10/21/22 0338)  cefTRIAXone (ROCEPHIN) 1 g in sodium chloride 0.9 % 100 mL IVPB (0 g Intravenous Stopped 10/21/22 0549)  metroNIDAZOLE (FLAGYL) IVPB 500 mg (0 mg Intravenous Stopped 10/21/22 0719)  cefTRIAXone (ROCEPHIN) 2 g in sodium chloride 0.9 % 100 mL IVPB (0 g Intravenous Stopped 10/21/22 0933)    Mobility walks with person assist     Focused Assessments     R Recommendations: See Admitting Provider Note  Report given to:   Additional Notes:

## 2022-10-21 NOTE — H&P (Addendum)
History and Physical    Patient: Connie Swanson VHQ:[id number] DOB: 23-Feb-1940 DOA: 10/21/2022 DOS: the patient was seen and examined on 10/21/2022 PCP: Gaspar Garbe, MD  Patient coming from: Home  Chief Complaint:  Chief Complaint  Patient presents with   Abdominal Pain   HPI: Connie Swanson is a 82 y.o. female with medical history significant of hypertension, hyperlipidemia, heart failure with preserved ejection fraction last EF 60-65%, CAD, complete heart block s/p pacemaker, and diabetes mellitus type 2 who presented with complaints of abdominal pain.  She had supper fired around 7 PM last night.  At around 11 PM she developed right upper quadrant pain that she reports was gnawing and constant.  Never had pain like that before in the past.  Noted associated symptoms of abdominal distention, nausea, and vomiting.  She chronically reports being short of breath and states that she has had a deep chest cough that is productive for the last couple weeks.  Denies having any recent fever, chills, or diarrhea.  She had taken Torsemide yesterday due to her abdominal distention, but does not take it on a daily basis.  Patient has not regularly been checking her weight to know if it is increased or not.  She had been seen by her cardiologist and primary care provider last week.  She had been placed on Augmentin 10/8 due to concern for upper respiratory infection, but did not note any change in her cough.  In the emergency department patient was noted to be afebrile with respirations 19-27, blood pressures elevated up to 201/75, and O2 saturations maintained.  Labs significant for WBC 13.3, hemoglobin 11.4, BUN 25, creatinine 1.9, glucose 228, lactic acid 2.3->1.5,  BNP 399, and high-sensitivity troponin negative x 2.  Chest x-ray noted moderate interstitial edema with small areas of superimposed right upper lobe and bibasilar atelectasis or infiltrate.  CT scan of the abdomen pelvis noted cholelithiasis  with suggestion of gallbladder wall thickening, scattered airspace opacities in the lung bases concerning for atelectasis or infiltrate, and cardiomegaly with coronary artery calcifications.  Ultrasound of the right upper quadrant noted dilation of common bile duct previously noted gallstone was not visualized.  General surgery have been consulted and placed orders for MRI/MRCP  Review of Systems: As mentioned in the history of present illness. All other systems reviewed and are negative. Past Medical History:  Diagnosis Date   CHF (congestive heart failure) (HCC)    Chronic kidney disease    Complete heart block (HCC)    Coronary artery disease    Diabetes mellitus without complication (HCC)    Hyperlipidemia    Hypertension    Pacemaker Medtronic dual chamber Pacemaker Assurity Dr-Rf 10/24/2021 11/29/2021   Past Surgical History:  Procedure Laterality Date   BREAST EXCISIONAL BIOPSY Right    HEMORRHOID SURGERY     MENISCUS REPAIR     PACEMAKER IMPLANT N/A 10/24/2021   Procedure: PACEMAKER IMPLANT;  Surgeon: Lanier Prude, MD;  Location: [facility];  Service: Cardiovascular;  Laterality: N/A;   THYROID SURGERY     VAGINAL HYSTERECTOMY     Social History:  reports that she has never smoked. She has never used smokeless tobacco. She reports that she does not drink alcohol and does not use drugs.  Allergies  Allergen Reactions   Dapagliflozin Nausea And Vomiting    Farxiga    Family History  Adopted: Yes    Prior to Admission medications   Medication Sig Start Date End  Date Taking? Authorizing Provider  acetaminophen (TYLENOL) 325 MG tablet Take 2 tablets (650 mg total) by mouth every 4 (four) hours as needed for headache or mild pain. 10/25/21  Yes Sherie Don, NP  allopurinol (ZYLOPRIM) 300 MG tablet Take 300 mg by mouth daily.   Yes [provider]  amoxicillin-clavulanate (AUGMENTIN) 875-125 MG tablet Take 1 tablet by mouth every 12 (twelve) hours.  10/16/22  Yes [provider]  atorvastatin (LIPITOR) 40 MG tablet Take 40 mg by mouth daily. 03/04/18  Yes [provider]  bumetanide (BUMEX) 0.5 MG tablet Take 1 tablet (0.5 mg total) by mouth daily as needed. 05/25/22  Yes Tolia, Sunit, DO  Cholecalciferol (VITAMIN D3) 125 MCG (5000 UT) TABS Take 5,000 Units by mouth daily at 12 noon.   Yes [provider]  denosumab (PROLIA) 60 MG/ML SOSY injection Inject 60 mg into the skin every 6 (six) months.   Yes [provider]  empagliflozin (JARDIANCE) 10 MG TABS tablet Take 1 tablet (10 mg total) by mouth daily before breakfast. 11/09/21  Yes Sumeriski, Helmut Muster, RPH  hydrALAZINE (APRESOLINE) 25 MG tablet Take 1 tablet (25 mg total) by mouth 3 (three) times daily. 04/30/22  Yes Tolia, Sunit, DO  isosorbide mononitrate (IMDUR) 30 MG 24 hr tablet Take 1/2 (one-half) tablet by mouth once daily 07/10/22  Yes Tolia, Sunit, DO  metoprolol succinate (TOPROL-XL) 50 MG 24 hr tablet Take 1 tablet (50 mg total) by mouth daily. Take with or immediately following a meal. 10/26/21  Yes Riddle, Luella Cook, NP  sacubitril-valsartan (ENTRESTO) 24-26 MG Take 1 tablet by mouth 2 (two) times daily.   Yes [provider]  senna (SENOKOT) 8.6 MG tablet Take 1 tablet by mouth daily.   Yes [provider]  B-D INS SYR ULTRAFINE 1CC/30G 30G X 1/2" 1 ML MISC USE SYRINGE AS DIRECTED TWICE DAILY 08/26/19   [provider]    Physical Exam: Vitals:   10/21/22 0713 10/21/22 0715 10/21/22 0800 10/21/22 0811  BP:  (!) 183/81 (!) 174/71   Pulse:  63 63   Resp:  19 17   Temp: 98 F (36.7 C)     TempSrc: Oral     SpO2:  100% 100% 100%  Weight:      Height:       Constitutional: Elderly female who appears to be in no acute distress Eyes: PERRL, lids and conjunctivae normal ENMT: Mucous membranes are moist. Posterior pharynx clear of any exudate or lesions.Normal dentition.  Neck: normal, supple, no masses, no  thyromegaly Respiratory: Normal respiratory effort with crackles heard in both lung fields. Cardiovascular: Regular rate and rhythm, no murmurs / rubs / gallops. No extremity edema. 2+ pedal pulses. No carotid bruits.  Abdomen: no tenderness, no masses palpated. No hepatosplenomegaly. Bowel sounds positive.  Musculoskeletal: no clubbing / cyanosis. No joint deformity upper and lower extremities. Good ROM, no contractures. Normal muscle tone.  Skin: no rashes, lesions, ulcers. No induration Neurologic: CN 2-12 grossly intact. Sensation intact, DTR normal. Strength 5/5 in all 4.  Psychiatric: Normal judgment and insight. Alert and oriented x 3. Normal mood.   Data Reviewed:  EKG revealed atrial sensed and ventricularly paced rhythm at 76 bpm.  Reviewed labs, imaging, and pertinent records as documented.  Assessment and Plan:  Cholelithiasis Possible cholecystitis Acute.  Patient presented with right upper quadrant abdominal pain starting yesterday evening 3-4 hours after having dinner.  Liver function studies were within normal limits.  CT scan of the  abdomen pelvis and subsequent ultrasound concerning for cholelithiasis with dilation of the common bile duct, but no visible stone.  General surgery have been consulted and placed orders for MRCP and HIDA scan to evaluate further for cholecystitis and possibility of choledocholithiasis. -Admit to a surgical telemetry bed it was initially related that patient could not have either scan today and was allowed to eat for which both scans are now delayed until 10/14. -N.p.o. after midnight -Follow-up HIDA scan and MRCP once able to be obtained 10/14 -Fentanyl IV as needed for pain -Continue empiric antibiotics of Rocephin IV  Suspected pulmonary edema secondary to acute on chronic heart failure with preserved ejection fraction Patient reports that she has had a productive cough now for couple weeks.  She does not routinely take her torsemide.  On  physical exam patient with crackles both lung fields present and plus pitting lower extremity edema.  BNP was noted to be 339.8.  Last EF noted to be 60 to 65% with indeterminate diastolic parameters back in 2023.  Suspecting patient is fluid overloaded, but on differential includes possibility of aspiration/ bacterial pneumonia. -Strict I&Os and daily weights -Check procalcitonin -Check echocardiogram -Lasix 40 mg IV twice daily -Will formally consult cardiology if patient noted to have significant change in LV function, but at this time just needs IV diuresis  Hypertensive urgency On admission blood pressures elevated up to 201/75. -Continue metoprolol, hydralazine, isosorbide mononitrate -Consider resuming Entresto in a.m. if kidney function remains stable  CAD Patient does not report any complaints of chest pain.  CT scan noted coronary artery calcifications present.  Patient had prior stress test back in 05/2020  was noted to be high risk because of her low LV function, but no reversible defect was appreciated.  Discussed with Dr. Melburn Popper of cardiology who makes note that patient is likely low risk patient's LV function was noted to preserved on echocardiogram for which she should not be at increased risk be able to undergo laparoscopic cholecystectomy. -Continue statin  Diabetes mellitus type 2 with hyperglycemia On admission glucose elevated at 228.  Home medications include Jardiance. -Hypoglycemic protocols -Continue Jardiance -CBGs before every meal with very sensitive SSI  Chronic kidney disease stage IV On admission creatinine 1.9 with BUN 25.B Baseline creatinine appears to be range from 1.6-2.    -Avoid possible nephrotoxic agents -Check hemoglobin A1c -Continue to monitor kidney function with diuresis  Complete heart block s/p permanent pacemaker Patient has a Medtronic dual-chamber pacemaker in place since 10/2021.  Hyperlipidemia -Continue atorvastatin  Obesity BMI  37.41 kg/m   DVT prophylaxis: Heparin Advance Care Planning:   Code Status: Full Code    Consults: General Surgery  Family Communication: Daughter updated over the phone.  Severity of Illness: The appropriate patient status for this patient is INPATIENT. Inpatient status is judged to be reasonable and necessary in order to provide the required intensity of service to ensure the patient's safety. The patient's presenting symptoms, physical exam findings, and initial radiographic and laboratory data in the context of their chronic comorbidities is felt to place them at high risk for further clinical deterioration. Furthermore, it is not anticipated that the patient will be medically stable for discharge from the hospital within 2 midnights of admission.   * I certify that at the point of admission it is my clinical judgment that the patient will require inpatient hospital care spanning beyond 2 midnights from the point of admission due to high intensity of service, high risk for further  deterioration and high frequency of surveillance required.*  Author: Clydie Braun, MD 10/21/2022 8:17 AM  For on call review [email or web address].

## 2022-10-21 NOTE — ED Notes (Signed)
All pt fall risk precautions are in place

## 2022-10-21 NOTE — Consult Note (Signed)
Consulting Physician: Hyman Hopes Yitzel Shasteen  Referring Provider: Dr. Madilyn Hook  Chief Complaint: Abdominal pain  Reason for Consult: RUQ abdominal pain   Subjective   HPI: Connie Swanson is an 82 y.o. female who is here for abdominal pain.  She has never had pain like this before.  The pain is located in the right upper quadrant.  She ate fried food for dinner last night.  The pain was very severe until she came into the hospital.  She finally felt better after her second dose of narcotics.  She does not avoid any foods in the past and has never had any issues like this before.  Past Medical History:  Diagnosis Date   CHF (congestive heart failure) (HCC)    Chronic kidney disease    Complete heart block (HCC)    Coronary artery disease    Diabetes mellitus without complication (HCC)    Hyperlipidemia    Hypertension    Pacemaker Medtronic dual chamber Pacemaker Assurity Dr-Rf 10/24/2021 11/29/2021    Past Surgical History:  Procedure Laterality Date   BREAST EXCISIONAL BIOPSY Right    HEMORRHOID SURGERY     MENISCUS REPAIR     PACEMAKER IMPLANT N/A 10/24/2021   Procedure: PACEMAKER IMPLANT;  Surgeon: Lanier Prude, MD;  Location: [facility];  Service: Cardiovascular;  Laterality: N/A;   THYROID SURGERY     VAGINAL HYSTERECTOMY      Family History  Adopted: Yes    Social:  reports that she has never smoked. She has never used smokeless tobacco. She reports that she does not drink alcohol and does not use drugs.  Allergies:  Allergies  Allergen Reactions   Dapagliflozin Nausea And Vomiting    Farxiga    Medications: Current Outpatient Medications  Medication Instructions   acetaminophen (TYLENOL) 650 mg, Oral, Every 4 hours PRN   allopurinol (ZYLOPRIM) 300 mg, Oral, Daily   amoxicillin-clavulanate (AUGMENTIN) 875-125 MG tablet 1 tablet, Oral, Every 12 hours   atorvastatin (LIPITOR) 40 mg, Oral, Daily   B-D INS SYR ULTRAFINE 1CC/30G 30G X 1/2" 1 ML MISC  USE SYRINGE AS DIRECTED TWICE DAILY   bumetanide (BUMEX) 0.5 mg, Oral, Daily PRN   denosumab (PROLIA) 60 mg, Subcutaneous, Every 6 months   empagliflozin (JARDIANCE) 10 mg, Oral, Daily before breakfast   hydrALAZINE (APRESOLINE) 25 mg, Oral, 3 times daily   isosorbide mononitrate (IMDUR) 30 MG 24 hr tablet Take 1/2 (one-half) tablet by mouth once daily   metoprolol succinate (TOPROL-XL) 50 mg, Oral, Daily, Take with or immediately following a meal.   sacubitril-valsartan (ENTRESTO) 24-26 MG 1 tablet, Oral, 2 times daily   senna (SENOKOT) 8.6 MG tablet 1 tablet, Oral, Daily   Vitamin D3 5,000 Units, Oral, Daily    ROS - all of the below systems have been reviewed with the patient and positives are indicated with bold text General: chills, fever or night sweats Eyes: blurry vision or double vision ENT: epistaxis or sore throat Allergy/Immunology: itchy/watery eyes or nasal congestion Hematologic/Lymphatic: bleeding problems, blood clots or swollen lymph nodes Endocrine: temperature intolerance or unexpected weight changes Breast: new or changing breast lumps or nipple discharge Resp: cough, shortness of breath, or wheezing CV: chest pain or dyspnea on exertion GI: as per HPI GU: dysuria, trouble voiding, or hematuria MSK: joint pain or joint stiffness Neuro: TIA or stroke symptoms Derm: pruritus and skin lesion changes Psych: anxiety and depression  Objective   PE Blood pressure (!) 174/73, pulse 68,  temperature 98.7 F (37.1 C), temperature source Oral, resp. rate (!) 24, height 5\' 1"  (1.549 m), weight 89.8 kg, SpO2 100%. Constitutional: NAD; conversant; no deformities Eyes: Moist conjunctiva; no lid lag; anicteric; PERRL Neck: Trachea midline; no thyromegaly Lungs: Normal respiratory effort; no tactile fremitus CV: RRR; no palpable thrills; no pitting edema GI: Abd soft, tender right upper quadrant; no palpable hepatosplenomegaly MSK: Normal range of motion of extremities; no  clubbing/cyanosis Psychiatric: Appropriate affect; alert and oriented x3 Lymphatic: No palpable cervical or axillary lymphadenopathy  Results for orders placed or performed during the hospital encounter of 10/21/22 (from the past 24 hour(s))  Lipase, blood     Status: None   Collection Time: 10/21/22  2:16 AM  Result Value Ref Range   Lipase 35 11 - 51 U/L  Comprehensive metabolic panel     Status: Abnormal   Collection Time: 10/21/22  2:16 AM  Result Value Ref Range   Sodium 138 135 - 145 mmol/L   Potassium 4.2 3.5 - 5.1 mmol/L   Chloride 107 98 - 111 mmol/L   CO2 22 22 - 32 mmol/L   Glucose, Bld 228 (H) 70 - 99 mg/dL   BUN 25 (H) 8 - 23 mg/dL   Creatinine, Ser 8.29 (H) 0.44 - 1.00 mg/dL   Calcium 8.7 (L) 8.9 - 10.3 mg/dL   Total Protein 6.6 6.5 - 8.1 g/dL   Albumin 2.6 (L) 3.5 - 5.0 g/dL   AST 13 (L) 15 - 41 U/L   ALT 12 0 - 44 U/L   Alkaline Phosphatase 69 38 - 126 U/L   Total Bilirubin 0.3 0.3 - 1.2 mg/dL   GFR, Estimated 26 (L) >60 mL/min   Anion gap 9 5 - 15  CBC     Status: Abnormal   Collection Time: 10/21/22  2:16 AM  Result Value Ref Range   WBC 13.3 (H) 4.0 - 10.5 K/uL   RBC 3.58 (L) 3.87 - 5.11 MIL/uL   Hemoglobin 11.4 (L) 12.0 - 15.0 g/dL   HCT 56.2 13.0 - 86.5 %   MCV 101.4 (H) 80.0 - 100.0 fL   MCH 31.8 26.0 - 34.0 pg   MCHC 31.4 30.0 - 36.0 g/dL   RDW 78.4 69.6 - 29.5 %   Platelets 276 150 - 400 K/uL   nRBC 0.0 0.0 - 0.2 %  Brain natriuretic peptide     Status: Abnormal   Collection Time: 10/21/22  2:16 AM  Result Value Ref Range   B Natriuretic Peptide 399.9 (H) 0.0 - 100.0 pg/mL  Troponin I (High Sensitivity)     Status: None   Collection Time: 10/21/22  2:16 AM  Result Value Ref Range   Troponin I (High Sensitivity) 11 <18 ng/L  Urinalysis, Routine w reflex microscopic -Urine, Clean Catch     Status: Abnormal   Collection Time: 10/21/22  2:17 AM  Result Value Ref Range   Color, Urine STRAW (A) YELLOW   APPearance CLEAR CLEAR   Specific Gravity,  Urine 1.008 1.005 - 1.030   pH 6.0 5.0 - 8.0   Glucose, UA >=500 (A) NEGATIVE mg/dL   Hgb urine dipstick NEGATIVE NEGATIVE   Bilirubin Urine NEGATIVE NEGATIVE   Ketones, ur NEGATIVE NEGATIVE mg/dL   Protein, ur 284 (A) NEGATIVE mg/dL   Nitrite NEGATIVE NEGATIVE   Leukocytes,Ua NEGATIVE NEGATIVE   RBC / HPF 0-5 0 - 5 RBC/hpf   WBC, UA 0-5 0 - 5 WBC/hpf   Bacteria, UA NONE SEEN NONE  SEEN   Squamous Epithelial / HPF 0-5 0 - 5 /HPF  I-Stat Lactic Acid     Status: Abnormal   Collection Time: 10/21/22  3:19 AM  Result Value Ref Range   Lactic Acid, Venous 2.3 (HH) 0.5 - 1.9 mmol/L   Comment NOTIFIED PHYSICIAN   Troponin I (High Sensitivity)     Status: None   Collection Time: 10/21/22  4:26 AM  Result Value Ref Range   Troponin I (High Sensitivity) 11 <18 ng/L  I-Stat Lactic Acid     Status: None   Collection Time: 10/21/22  5:32 AM  Result Value Ref Range   Lactic Acid, Venous 1.5 0.5 - 1.9 mmol/L     Imaging Orders         DG Chest 1 View         CT ABDOMEN PELVIS WO CONTRAST         US Abdomen Limited RUQ (LIVER/GB)         NM Hepatobiliary Liver Func         MR ABDOMEN MRCP W WO CONTAST      Assessment and Plan   Connie Swanson is an 82 y.o. female with abdominal pain and CT and Ultrasound findings concerning for cholecystitis or choledocholithiasis.  I recommend checking a HIDA scan and MRCP to evaluate for acute cholecystitis and evaluate for choledocholithiasis.  I think additional testing is important to fully understand the pathology so we can recommend the best course of action to avoid risks in this medically complex patient.  It would be helpful to have a medical evaluation and possible cardiology evaluation of her preoperative risk to help decide whether she would be a candidate for laparoscopic cholecystectomy or to proceed with the lower risk option of percutaneous cholecystectomy if indicated.      ICD-10-CM   1. Community acquired pneumonia of right lower lobe of  lung  J18.9     2. Cholecystitis  K81.9        Quentin Ore, MD  [facility] Use [email or web address] to contact on call provider  New Patient Billing: [id number] - High MDM

## 2022-10-21 NOTE — Consult Note (Signed)
Rounding Note    Patient Name: Connie Swanson Date of Encounter: 10/21/2022  [facility] Cardiologist: Odis Hollingshead   Subjective   82 yo with hx of chronic diastolic CHF Pacer  Presents with cholecyctitis   Denies any cp or dyspnea Her chronic diastolic chf has been well controlled.      Inpatient Medications    Scheduled Meds:  allopurinol  300 mg Oral Daily   atorvastatin  40 mg Oral Daily   [START ON 10/22/2022] empagliflozin  10 mg Oral QAC breakfast   furosemide  40 mg Intravenous BID   guaiFENesin  600 mg Oral BID   heparin  5,000 Units Subcutaneous Q8H   hydrALAZINE  25 mg Oral TID   insulin aspart  0-6 Units Subcutaneous TID WC   isosorbide mononitrate  15 mg Oral Daily   sodium chloride flush  3 mL Intravenous Q12H   Continuous Infusions:  [START ON 10/22/2022] cefTRIAXone (ROCEPHIN)  IV     PRN Meds: acetaminophen **OR** acetaminophen, albuterol, hydrALAZINE, HYDROmorphone (DILAUDID) injection, ondansetron (ZOFRAN) IV   Vital Signs    Vitals:   10/21/22 1300 10/21/22 1315 10/21/22 1330 10/21/22 1405  BP: (!) 187/64 (!) 167/57  (!) 148/60  Pulse: (!) 45 (!) 39 (!) 38 86  Resp: (!) 21 (!) 24 (!) 25 20  Temp:    98 F (36.7 C)  TempSrc:    Oral  SpO2: 96% 100% 100% 93%  Weight:      Height:        Intake/Output Summary (Last 24 hours) at 10/21/2022 1526 Last data filed at 10/21/2022 0719 Gross per 24 hour  Intake 100 ml  Output 700 ml  Net -600 ml      10/21/2022    2:09 AM 08/21/2022   11:01 AM 02/20/2022    1:43 PM  Last 3 Weights  Weight (lbs) 198 lb 203 lb 9.6 oz 192 lb 12.8 oz  Weight (kg) 89.812 kg 92.352 kg 87.454 kg      Telemetry    Atrial sensed, V paced   - Personally Reviewed  ECG     - Personally Reviewed  Physical Exam   GEN: eldelry female, NAD , sleepy  Neck: No JVD Cardiac: RRR, no murmurs, rubs, or gallops.  Respiratory: Clear to auscultation bilaterally. GI: Soft, nontender, non-distended  MS: No  edema; No deformity. Neuro:  Nonfocal  Psych: Normal affect   Labs    High Sensitivity Troponin:   Recent Labs  Lab 10/21/22 0216 10/21/22 0426  TROPONINIHS 11 11     Chemistry Recent Labs  Lab 10/21/22 0216  NA 138  K 4.2  CL 107  CO2 22  GLUCOSE 228*  BUN 25*  CREATININE 1.90*  CALCIUM 8.7*  PROT 6.6  ALBUMIN 2.6*  AST 13*  ALT 12  ALKPHOS 69  BILITOT 0.3  GFRNONAA 26*  ANIONGAP 9    Lipids No results for input(s): "CHOL", "TRIG", "HDL", "LABVLDL", "LDLCALC", "CHOLHDL" in the last 168 hours.  Hematology Recent Labs  Lab 10/21/22 0216  WBC 13.3*  RBC 3.58*  HGB 11.4*  HCT 36.3  MCV 101.4*  MCH 31.8  MCHC 31.4  RDW 15.1  PLT 276   Thyroid No results for input(s): "TSH", "FREET4" in the last 168 hours.  BNP Recent Labs  Lab 10/21/22 0216  BNP 399.9*    DDimer No results for input(s): "DDIMER" in the last 168 hours.   Radiology    US Abdomen Limited RUQ (  LIVER/GB)  Result Date: 10/21/2022 CLINICAL DATA:  82 year old female with history of right upper quadrant abdominal pain. EXAM: ULTRASOUND ABDOMEN LIMITED RIGHT UPPER QUADRANT COMPARISON:  None Available. FINDINGS: Gallbladder: No gallstone confidently identified. Gallbladder is only moderately distended. Gallbladder wall thickness is slightly prominent (3.5 mm). No pericholecystic fluid. Per report from the sonographer, there was no sonographic Murphy's sign on examination. Common bile duct: Diameter: 9.3 mm proximally and up to 15 mm distally. Liver: No focal lesion identified. Within normal limits in parenchymal echogenicity allowing for body habitus. Portal vein is patent on color Doppler imaging with normal direction of blood flow towards the liver. Other: None. IMPRESSION: 1. Dilatation of the common bile duct. Previously noted gallstone is not readily apparent in the lumen of the gallbladder on today's ultrasound. The possibility of a recently migrated stone with biliary obstruction should be  considered. Further evaluation with abdominal MRI with and without IV gadolinium with MRCP should be considered to exclude obstructive choledocholithiasis in the appropriate clinical setting. Electronically Signed   By: Trudie Reed M.D.   On: 10/21/2022 06:14   CT ABDOMEN PELVIS WO CONTRAST  Result Date: 10/21/2022 CLINICAL DATA:  Right upper quadrant abdominal pain with nausea and vomiting. EXAM: CT ABDOMEN AND PELVIS WITHOUT CONTRAST TECHNIQUE: Multidetector CT imaging of the abdomen and pelvis was performed following the standard protocol without IV contrast. RADIATION DOSE REDUCTION: This exam was performed according to the departmental dose-optimization program which includes automated exposure control, adjustment of the mA and/or kV according to patient size and/or use of iterative reconstruction technique. COMPARISON:  None Available. FINDINGS: Lower chest: The heart is enlarged and there is no pericardial effusion. Pacemaker leads are present in the heart. There is calcification of the mitral valve. Coronary artery calcifications are noted. Scattered airspace opacities are noted at the lung bases. Hepatobiliary: No focal liver abnormality is seen. No biliary ductal dilatation. A stone is present within the gallbladder. There is suggestion of mild gallbladder wall thickening. Pancreas: Unremarkable. No pancreatic ductal dilatation or surrounding inflammatory changes. Spleen: Normal in size without focal abnormality. Adrenals/Urinary Tract: The adrenal glands are within normal limits. No renal calculus or hydronephrosis. The bladder is unremarkable. Stomach/Bowel: Stomach is within normal limits. Appendix is not seen. No evidence of bowel wall thickening, distention, or inflammatory changes. No free air or pneumatosis. Scattered diverticula are present along the colon without evidence of diverticulitis. Vascular/Lymphatic: Aortic atherosclerosis. No enlarged abdominal or pelvic lymph nodes.  Reproductive: Status post hysterectomy. No adnexal masses. Other: No abdominopelvic ascites. Musculoskeletal: Bilateral pars defects are noted at L5 with mild anterolisthesis at L5-S1. Degenerative changes are noted in the thoracolumbar spine. No acute osseous abnormality. IMPRESSION: 1. Cholelithiasis with a suggestion of gallbladder wall thickening. Ultrasound is recommended for further evaluation. 2. Scattered airspace opacities at the lung bases, possible atelectasis or infiltrate. 3. Diverticulosis without diverticulitis. 4. Cardiomegaly with coronary artery calcifications. 5. Aortic atherosclerosis. Electronically Signed   By: Thornell Sartorius M.D.   On: 10/21/2022 04:49   DG Chest 1 View  Result Date: 10/21/2022 CLINICAL DATA:  Abdominal pain with bilateral rales. EXAM: CHEST  1 VIEW COMPARISON:  October 25, 2021 FINDINGS: There is stable dual lead AICD positioning. The cardiac silhouette is mildly enlarged and unchanged in size. Moderate severity diffusely increased interstitial lung markings are noted. Mild areas of superimposed atelectasis and/or infiltrate are seen within the right upper lobe and bilateral lung bases. No pleural effusion or pneumothorax is identified. The visualized skeletal structures are unremarkable. IMPRESSION:  Moderate severity interstitial edema with mild areas of superimposed right upper lobe and bibasilar atelectasis and/or infiltrate. Electronically Signed   By: Aram Candela M.D.   On: 10/21/2022 02:48    Cardiac Studies     Patient Profile     82 y.o. female   Assessment & Plan     Chronic diastolic CHF:   echo from last year shows a normal LVEF.    She had a myoview study in 2022 that suggested that she had moderate - severe LV dysfunction .    However, echo performed the week before and another echo performed a year show that the LV function was normal  In this case, the EF calculated by myoview was erroneous and was due to computer reading error. We  typically defer to the EF listed on echo as a better assessment of LV function when the myoview gives an erroneous reading   She is at low risk for her upcoming cholecystectomy. Pacer is stable    For questions or updates, please contact [facility] Please consult [email or web address] for contact info under        Signed, Kristeen Miss, MD  10/21/2022, 3:26 PM

## 2022-10-21 NOTE — ED Notes (Signed)
General surgery MD at bedside for eval.

## 2022-10-22 ENCOUNTER — Inpatient Hospital Stay (HOSPITAL_COMMUNITY): Payer: Medicare Other

## 2022-10-22 DIAGNOSIS — K8013 Calculus of gallbladder with acute and chronic cholecystitis with obstruction: Secondary | ICD-10-CM | POA: Diagnosis not present

## 2022-10-22 DIAGNOSIS — I5031 Acute diastolic (congestive) heart failure: Secondary | ICD-10-CM

## 2022-10-22 DIAGNOSIS — Z01810 Encounter for preprocedural cardiovascular examination: Secondary | ICD-10-CM

## 2022-10-22 LAB — CBC
HCT: 35.3 % — ABNORMAL LOW (ref 36.0–46.0)
Hemoglobin: 11 g/dL — ABNORMAL LOW (ref 12.0–15.0)
MCH: 32.1 pg (ref 26.0–34.0)
MCHC: 31.2 g/dL (ref 30.0–36.0)
MCV: 102.9 fL — ABNORMAL HIGH (ref 80.0–100.0)
Platelets: 253 10*3/uL (ref 150–400)
RBC: 3.43 MIL/uL — ABNORMAL LOW (ref 3.87–5.11)
RDW: 15.7 % — ABNORMAL HIGH (ref 11.5–15.5)
WBC: 21.4 10*3/uL — ABNORMAL HIGH (ref 4.0–10.5)
nRBC: 0 % (ref 0.0–0.2)

## 2022-10-22 LAB — COMPREHENSIVE METABOLIC PANEL
ALT: 919 U/L — ABNORMAL HIGH (ref 0–44)
AST: 1275 U/L — ABNORMAL HIGH (ref 15–41)
Albumin: 2.4 g/dL — ABNORMAL LOW (ref 3.5–5.0)
Alkaline Phosphatase: 385 U/L — ABNORMAL HIGH (ref 38–126)
Anion gap: 12 (ref 5–15)
BUN: 35 mg/dL — ABNORMAL HIGH (ref 8–23)
CO2: 20 mmol/L — ABNORMAL LOW (ref 22–32)
Calcium: 8.7 mg/dL — ABNORMAL LOW (ref 8.9–10.3)
Chloride: 105 mmol/L (ref 98–111)
Creatinine, Ser: 2.74 mg/dL — ABNORMAL HIGH (ref 0.44–1.00)
GFR, Estimated: 17 mL/min — ABNORMAL LOW (ref >60)
Glucose, Bld: 191 mg/dL — ABNORMAL HIGH (ref 70–99)
Potassium: 5.3 mmol/L — ABNORMAL HIGH (ref 3.5–5.1)
Sodium: 137 mmol/L (ref 135–145)
Total Bilirubin: 2.5 mg/dL — ABNORMAL HIGH (ref 0.3–1.2)
Total Protein: 6.5 g/dL (ref 6.5–8.1)

## 2022-10-22 LAB — HEPATIC FUNCTION PANEL
ALT: 677 U/L — ABNORMAL HIGH (ref 0–44)
AST: 553 U/L — ABNORMAL HIGH (ref 15–41)
Albumin: 2.5 g/dL — ABNORMAL LOW (ref 3.5–5.0)
Alkaline Phosphatase: 382 U/L — ABNORMAL HIGH (ref 38–126)
Bilirubin, Direct: 2 mg/dL — ABNORMAL HIGH (ref 0.0–0.2)
Indirect Bilirubin: 0.6 mg/dL (ref 0.3–0.9)
Total Bilirubin: 2.6 mg/dL — ABNORMAL HIGH (ref 0.3–1.2)
Total Protein: 6.5 g/dL (ref 6.5–8.1)

## 2022-10-22 LAB — ECHOCARDIOGRAM COMPLETE
AR max vel: 2.76 cm2
AV Area VTI: 2.8 cm2
AV Area mean vel: 2.91 cm2
AV Mean grad: 8 mm[Hg]
AV Peak grad: 17.8 mm[Hg]
Ao pk vel: 2.11 m/s
Height: 61 in
MV VTI: 3.16 cm2
Weight: 3167.57 [oz_av]

## 2022-10-22 LAB — GLUCOSE, CAPILLARY
Glucose-Capillary: 136 mg/dL — ABNORMAL HIGH (ref 70–99)
Glucose-Capillary: 120 mg/dL — ABNORMAL HIGH (ref 70–99)
Glucose-Capillary: 128 mg/dL — ABNORMAL HIGH (ref 70–99)
Glucose-Capillary: 178 mg/dL — ABNORMAL HIGH (ref 70–99)

## 2022-10-22 MED ORDER — PERFLUTREN LIPID MICROSPHERE
1.0000 mL | INTRAVENOUS | Status: AC | PRN
  Administered 2022-10-22: 3 mL via INTRAVENOUS

## 2022-10-22 MED ORDER — TECHNETIUM TC 99M MEBROFENIN IV KIT
7.5000 | PACK | Freq: Once | INTRAVENOUS | Status: AC | PRN
  Administered 2022-10-22: 7.5 via INTRAVENOUS

## 2022-10-22 MED ORDER — METRONIDAZOLE 500 MG/100ML IV SOLN
500.0000 mg | Freq: Two times a day (BID) | INTRAVENOUS | Status: DC
  Administered 2022-10-22 – 2022-10-25 (×7): 500 mg via INTRAVENOUS
  Filled 2022-10-22 (×7): qty 100

## 2022-10-22 MED ORDER — HYDRALAZINE HCL 10 MG PO TABS
10.0000 mg | ORAL_TABLET | Freq: Three times a day (TID) | ORAL | Status: DC
  Administered 2022-10-22: 10 mg via ORAL
  Filled 2022-10-22: qty 1

## 2022-10-22 NOTE — Progress Notes (Signed)
PROGRESS NOTE    Connie Swanson  UYQ:[id number] DOB: [birth year] DOA: 10/21/2022 PCP: Gaspar Garbe, MD  82/F with history of chronic diastolic CHF, CAD, complete heart block with pacer, type 2 diabetes mellitus, hypertension, dyslipidemia presented to the ED with right-sided and epigastric abdominal pain that started 10/11, associated with nausea and vomiting.  -In addition had cough and congestion for a few weeks, was on antibiotics for?  Pneumonia -In the ED she was afebrile, blood pressure 140s,, labs noted creatinine of 1.9, glucose 228, WBC 13, troponin 11,, chest x-ray with some edema and?  Infiltrate versus atelectasis, CT abdomen noted cholelithiasis with gallbladder wall thickening, opacities at lung bases -RUQ ultrasound with CBD dilation -Labs this a.m. with worsening leukocytosis, kidney function, significantly elevated LFTs and bilirubin  Subjective: -Abdominal pain is a little better today, no nausea or vomiting, mild intermittent cough  Assessment and Plan:  Cholelithiasis Suspected cholangitis -Acute epigastric/right-sided abdominal pain with nausea vomiting, significantly worsening LFTs and bilirubin this morning, WBC up to 21K, creatinine higher as well -CT abdomen pelvis with gallbladder wall thickening and cholelithiasis -RUQ ultrasound with CBD dilation -Clinically picture concerning for choledocholithiasis, possible cholangitis -General Surgery following, gastroenterology Dr.Vreeland consulted -Keep n.p.o., may not be able to have MRCP with her pacer -Continue IV ceftriaxone and metronidazole, day 2   Acute on chronic diastolic CHF Hypoalbuminemia, 2.4 -Last echo 10/23 with EF 60-65%, indeterminate diastolic function  -Seen by cards today, felt to be low risk for cholecystectomy -Hold diuretics today, continue Jardiance  AKI on CKD 3 -Baseline creatinine around 1.6-2, creatinine higher today, likely in the setting of above issues -Hold diuretics, continue  antibiotics, avoid hypotension, hold Entresto -May need gentle IV fluids if she remains n.p.o. for a while   Hypertensive urgency On admission blood pressures elevated up to 201/75. -Now BP trending low, hold Entresto, hydralazine, diuretics  Recent?  Pneumonia -Completed a course of Augmentin -Now on antibiotics as above for?  Cholangitis, add incentive spirometry  CAD -, No symptoms at this time, prior stress test back in 05/2020  was noted to be high risk because of her low LV function, but no reversible defect was appreciated.   -Appreciate cards input, repeat echo with improved EF -Continue statin   Diabetes mellitus type 2 with hyperglycemia -CBGs improving, continue Jardiance, A1c 7.6   Complete heart block s/p permanent pacemaker -has a [employer]onic dual-chamber pacemaker in place since 10/2021.   Hyperlipidemia -Continue atorvastatin   Obesity BMI 37.41 kg/m     DVT prophylaxis: Heparin Advance Care Planning:   Code Status: Full Code     Consults: General Surgery   Family Communication: Daughter at bedside Disposition: Home pending improvement in above issues  Consultants:    Procedures:   Antimicrobials:    Objective: Vitals:   10/21/22 2318 10/22/22 0436 10/22/22 0500 10/22/22 0837  BP: (!) 126/49 (!) 103/50  (!) 111/53  Pulse: 69 67  79  Resp: 16 18  17   Temp: 97.7 F (36.5 C) 97.7 F (36.5 C)    TempSrc: Oral Oral    SpO2: 97% 94%  90%  Weight:   89.8 kg   Height:        Intake/Output Summary (Last 24 hours) at 10/22/2022 1114 Last data filed at 10/22/2022 0830 Gross per 24 hour  Intake 0 ml  Output 200 ml  Net -200 ml   Filed Weights   10/21/22 0209 10/22/22 0500  Weight: 89.8 kg 89.8 kg    Examination:  General exam: Obese chronically ill elderly female sitting up in bed, AAOx3 HEENT: No JVD CVS: S1-S2, regular rhythm Abdomen: Soft, right-sided tenderness, bowel sounds present Extremities: No edema Skin: No rashes Psychiatry:   Mood & affect appropriate.     Data Reviewed:   CBC: Recent Labs  Lab 10/21/22 0216 10/22/22 0302  WBC 13.3* 21.4*  HGB 11.4* 11.0*  HCT 36.3 35.3*  MCV 101.4* 102.9*  PLT 276 253   Basic Metabolic Panel: Recent Labs  Lab 10/21/22 0216 10/22/22 0302  NA 138 137  K 4.2 5.3*  CL 107 105  CO2 22 20*  GLUCOSE 228* 191*  BUN 25* 35*  CREATININE 1.90* 2.74*  CALCIUM 8.7* 8.7*   GFR: Estimated Creatinine Clearance: 15.6 mL/min (A) (by C-G formula based on SCr of 2.74 mg/dL (H)). Liver Function Tests: Recent Labs  Lab 10/21/22 0216 10/22/22 0302  AST 13* 1,275*  ALT 12 919*  ALKPHOS 69 385*  BILITOT 0.3 2.5*  PROT 6.6 6.5  ALBUMIN 2.6* 2.4*   Recent Labs  Lab 10/21/22 0216  LIPASE 35   No results for input(s): "AMMONIA" in the last 168 hours. Coagulation Profile: No results for input(s): "INR", "PROTIME" in the last 168 hours. Cardiac Enzymes: No results for input(s): "CKTOTAL", "CKMB", "CKMBINDEX", "TROPONINI" in the last 168 hours. BNP (last 3 results) Recent Labs    03/01/22 1034 03/22/22 1048 05/17/22 1406  PROBNP 1,840* 1,810* 925*   HbA1C: Recent Labs    10/21/22 1436  HGBA1C 7.6*   CBG: Recent Labs  Lab 10/21/22 2237 10/22/22 0834  GLUCAP 206* 136*   Lipid Profile: No results for input(s): "CHOL", "HDL", "LDLCALC", "TRIG", "CHOLHDL", "LDLDIRECT" in the last 72 hours. Thyroid Function Tests: No results for input(s): "TSH", "T4TOTAL", "FREET4", "T3FREE", "THYROIDAB" in the last 72 hours. Anemia Panel: No results for input(s): "VITAMINB12", "FOLATE", "FERRITIN", "TIBC", "IRON", "RETICCTPCT" in the last 72 hours. Urine analysis:    Component Value Date/Time   COLORURINE STRAW (A) 10/21/2022 0217   APPEARANCEUR CLEAR 10/21/2022 0217   LABSPEC 1.008 10/21/2022 0217   PHURINE 6.0 10/21/2022 0217   GLUCOSEU >=500 (A) 10/21/2022 0217   HGBUR NEGATIVE 10/21/2022 0217   BILIRUBINUR NEGATIVE 10/21/2022 0217   KETONESUR NEGATIVE  10/21/2022 0217   PROTEINUR 100 (A) 10/21/2022 0217   NITRITE NEGATIVE 10/21/2022 0217   LEUKOCYTESUR NEGATIVE 10/21/2022 0217   Sepsis Labs: @LABRCNTIP (procalcitonin:4,lacticidven:4)  ) Recent Results (from the past 240 hour(s))  Culture, blood (routine x 2)     Status: None (Preliminary result)   Collection Time: 10/21/22  3:40 AM   Specimen: BLOOD RIGHT ARM  Result Value Ref Range Status   Specimen Description BLOOD RIGHT ARM  Final   Special Requests   Final    BOTTLES DRAWN AEROBIC AND ANAEROBIC Blood Culture adequate volume   Culture   Final    NO GROWTH 1 DAY Performed at [facility], [address], [place], [place] [address]    Report Status PENDING  Incomplete  Culture, blood (routine x 2)     Status: None (Preliminary result)   Collection Time: 10/21/22  3:50 AM   Specimen: BLOOD RIGHT HAND  Result Value Ref Range Status   Specimen Description BLOOD RIGHT HAND  Final   Special Requests   Final    BOTTLES DRAWN AEROBIC AND ANAEROBIC Blood Culture results may not be optimal due to an inadequate volume of blood received in culture bottles   Culture   Final  NO GROWTH 1 DAY Performed at [facility], [address] [place], [place] [address]    Report Status PENDING  Incomplete     Radiology Studies: US Abdomen Limited RUQ (LIVER/GB)  Result Date: 10/21/2022 CLINICAL DATA:  82 year old female with history of right upper quadrant abdominal pain. EXAM: ULTRASOUND ABDOMEN LIMITED RIGHT UPPER QUADRANT COMPARISON:  None Available. FINDINGS: Gallbladder: No gallstone confidently identified. Gallbladder is only moderately distended. Gallbladder wall thickness is slightly prominent (3.5 mm). No pericholecystic fluid. Per report from the sonographer, there was no sonographic Murphy's sign on examination. Common bile duct: Diameter: 9.3 mm proximally and up to 15 mm distally. Liver: No focal lesion identified. Within normal limits in parenchymal echogenicity  allowing for body habitus. Portal vein is patent on color Doppler imaging with normal direction of blood flow towards the liver. Other: None. IMPRESSION: 1. Dilatation of the common bile duct. Previously noted gallstone is not readily apparent in the lumen of the gallbladder on today's ultrasound. The possibility of a recently migrated stone with biliary obstruction should be considered. Further evaluation with abdominal MRI with and without IV gadolinium with MRCP should be considered to exclude obstructive choledocholithiasis in the appropriate clinical setting. Electronically Signed   By: Trudie Reed M.D.   On: 10/21/2022 06:14   CT ABDOMEN PELVIS WO CONTRAST  Result Date: 10/21/2022 CLINICAL DATA:  Right upper quadrant abdominal pain with nausea and vomiting. EXAM: CT ABDOMEN AND PELVIS WITHOUT CONTRAST TECHNIQUE: Multidetector CT imaging of the abdomen and pelvis was performed following the standard protocol without IV contrast. RADIATION DOSE REDUCTION: This exam was performed according to the departmental dose-optimization program which includes automated exposure control, adjustment of the mA and/or kV according to patient size and/or use of iterative reconstruction technique. COMPARISON:  None Available. FINDINGS: Lower chest: The heart is enlarged and there is no pericardial effusion. Pacemaker leads are present in the heart. There is calcification of the mitral valve. Coronary artery calcifications are noted. Scattered airspace opacities are noted at the lung bases. Hepatobiliary: No focal liver abnormality is seen. No biliary ductal dilatation. A stone is present within the gallbladder. There is suggestion of mild gallbladder wall thickening. Pancreas: Unremarkable. No pancreatic ductal dilatation or surrounding inflammatory changes. Spleen: Normal in size without focal abnormality. Adrenals/Urinary Tract: The adrenal glands are within normal limits. No renal calculus or hydronephrosis. The  bladder is unremarkable. Stomach/Bowel: Stomach is within normal limits. Appendix is not seen. No evidence of bowel wall thickening, distention, or inflammatory changes. No free air or pneumatosis. Scattered diverticula are present along the colon without evidence of diverticulitis. Vascular/Lymphatic: Aortic atherosclerosis. No enlarged abdominal or pelvic lymph nodes. Reproductive: Status post hysterectomy. No adnexal masses. Other: No abdominopelvic ascites. Musculoskeletal: Bilateral pars defects are noted at L5 with mild anterolisthesis at L5-S1. Degenerative changes are noted in the thoracolumbar spine. No acute osseous abnormality. IMPRESSION: 1. Cholelithiasis with a suggestion of gallbladder wall thickening. Ultrasound is recommended for further evaluation. 2. Scattered airspace opacities at the lung bases, possible atelectasis or infiltrate. 3. Diverticulosis without diverticulitis. 4. Cardiomegaly with coronary artery calcifications. 5. Aortic atherosclerosis. Electronically Signed   By: Thornell Sartorius M.D.   On: 10/21/2022 04:49   DG Chest 1 View  Result Date: 10/21/2022 CLINICAL DATA:  Abdominal pain with bilateral rales. EXAM: CHEST  1 VIEW COMPARISON:  October 25, 2021 FINDINGS: There is stable dual lead AICD positioning. The cardiac silhouette is mildly enlarged and unchanged in size. Moderate severity diffusely increased interstitial lung  markings are noted. Mild areas of superimposed atelectasis and/or infiltrate are seen within the right upper lobe and bilateral lung bases. No pleural effusion or pneumothorax is identified. The visualized skeletal structures are unremarkable. IMPRESSION: Moderate severity interstitial edema with mild areas of superimposed right upper lobe and bibasilar atelectasis and/or infiltrate. Electronically Signed   By: Aram Candela M.D.   On: 10/21/2022 02:48     Scheduled Meds:  allopurinol  300 mg Oral Daily   atorvastatin  40 mg Oral Daily   empagliflozin   10 mg Oral QAC breakfast   guaiFENesin  600 mg Oral BID   heparin  5,000 Units Subcutaneous Q8H   hydrALAZINE  10 mg Oral TID   insulin aspart  0-6 Units Subcutaneous TID WC   sodium chloride flush  3 mL Intravenous Q12H   Continuous Infusions:  cefTRIAXone (ROCEPHIN)  IV 2 g (10/22/22 0957)   metronidazole 500 mg (10/22/22 0808)     LOS: 1 day    Time spent:59min    Zannie Cove, MD [facility]   10/22/2022, 11:14 AM

## 2022-10-22 NOTE — Progress Notes (Signed)
  Echocardiogram 2D Echocardiogram has been performed.  Connie Swanson 10/22/2022, 5:20 PM

## 2022-10-22 NOTE — Progress Notes (Signed)
Subjective: Family reports that she experienced some confusion with medications last night but this morning she is back to baseline and reports that her pain has improved.    Tbili this morning 2.5 (0.3), AST 1275, ALT 919 WBC 21 (13) Cr 2.7 (1.9)  ROS: See above, otherwise other systems negative  Objective: Vital signs in last 24 hours: Temp:  [97.7 F (36.5 C)-98.7 F (37.1 C)] 97.7 F (36.5 C) (10/14 0436) Pulse Rate:  [38-86] 79 (10/14 0837) Resp:  [16-27] 17 (10/14 0837) BP: (91-187)/(49-121) 111/53 (10/14 0837) SpO2:  [90 %-100 %] 90 % (10/14 0837) Weight:  [89.8 kg] 89.8 kg (10/14 0500)    Intake/Output from previous day: 10/13 0701 - 10/14 0700 In: 100 [IV Piggyback:100] Out: 200 [Urine:200] Intake/Output this shift: No intake/output data recorded.  PE: Gen: elderly female, NAD Resp: equal chest rise CV: RRR Abd: soft, non-distended, TTP in the RUQ, + guarding, no peritoneal signs  Lab Results:  Recent Labs    10/21/22 0216 10/22/22 0302  WBC 13.3* 21.4*  HGB 11.4* 11.0*  HCT 36.3 35.3*  PLT 276 253   BMET Recent Labs    10/21/22 0216 10/22/22 0302  NA 138 137  K 4.2 5.3*  CL 107 105  CO2 22 20*  GLUCOSE 228* 191*  BUN 25* 35*  CREATININE 1.90* 2.74*  CALCIUM 8.7* 8.7*   PT/INR No results for input(s): "LABPROT", "INR" in the last 72 hours. CMP     Component Value Date/Time   NA 137 10/22/2022 0302   NA 139 05/17/2022 1413   K 5.3 (H) 10/22/2022 0302   CL 105 10/22/2022 0302   CO2 20 (L) 10/22/2022 0302   GLUCOSE 191 (H) 10/22/2022 0302   BUN 35 (H) 10/22/2022 0302   BUN 29 (H) 05/17/2022 1413   CREATININE 2.74 (H) 10/22/2022 0302   CALCIUM 8.7 (L) 10/22/2022 0302   PROT 6.5 10/22/2022 0302   ALBUMIN 2.4 (L) 10/22/2022 0302   AST 1,275 (H) 10/22/2022 0302   ALT 919 (H) 10/22/2022 0302   ALKPHOS 385 (H) 10/22/2022 0302   BILITOT 2.5 (H) 10/22/2022 0302   GFRNONAA 17 (L) 10/22/2022 0302   Lipase     Component Value  Date/Time   LIPASE 35 10/21/2022 0216    Studies/Results: US Abdomen Limited RUQ (LIVER/GB)  Result Date: 10/21/2022 CLINICAL DATA:  82 year old female with history of right upper quadrant abdominal pain. EXAM: ULTRASOUND ABDOMEN LIMITED RIGHT UPPER QUADRANT COMPARISON:  None Available. FINDINGS: Gallbladder: No gallstone confidently identified. Gallbladder is only moderately distended. Gallbladder wall thickness is slightly prominent (3.5 mm). No pericholecystic fluid. Per report from the sonographer, there was no sonographic Murphy's sign on examination. Common bile duct: Diameter: 9.3 mm proximally and up to 15 mm distally. Liver: No focal lesion identified. Within normal limits in parenchymal echogenicity allowing for body habitus. Portal vein is patent on color Doppler imaging with normal direction of blood flow towards the liver. Other: None. IMPRESSION: 1. Dilatation of the common bile duct. Previously noted gallstone is not readily apparent in the lumen of the gallbladder on today's ultrasound. The possibility of a recently migrated stone with biliary obstruction should be considered. Further evaluation with abdominal MRI with and without IV gadolinium with MRCP should be considered to exclude obstructive choledocholithiasis in the appropriate clinical setting. Electronically Signed   By: Trudie Reed M.D.   On: 10/21/2022 06:14   CT ABDOMEN PELVIS WO CONTRAST  Result Date: 10/21/2022 CLINICAL DATA:  Right upper quadrant abdominal pain with nausea and vomiting. EXAM: CT ABDOMEN AND PELVIS WITHOUT CONTRAST TECHNIQUE: Multidetector CT imaging of the abdomen and pelvis was performed following the standard protocol without IV contrast. RADIATION DOSE REDUCTION: This exam was performed according to the departmental dose-optimization program which includes automated exposure control, adjustment of the mA and/or kV according to patient size and/or use of iterative reconstruction technique.  COMPARISON:  None Available. FINDINGS: Lower chest: The heart is enlarged and there is no pericardial effusion. Pacemaker leads are present in the heart. There is calcification of the mitral valve. Coronary artery calcifications are noted. Scattered airspace opacities are noted at the lung bases. Hepatobiliary: No focal liver abnormality is seen. No biliary ductal dilatation. A stone is present within the gallbladder. There is suggestion of mild gallbladder wall thickening. Pancreas: Unremarkable. No pancreatic ductal dilatation or surrounding inflammatory changes. Spleen: Normal in size without focal abnormality. Adrenals/Urinary Tract: The adrenal glands are within normal limits. No renal calculus or hydronephrosis. The bladder is unremarkable. Stomach/Bowel: Stomach is within normal limits. Appendix is not seen. No evidence of bowel wall thickening, distention, or inflammatory changes. No free air or pneumatosis. Scattered diverticula are present along the colon without evidence of diverticulitis. Vascular/Lymphatic: Aortic atherosclerosis. No enlarged abdominal or pelvic lymph nodes. Reproductive: Status post hysterectomy. No adnexal masses. Other: No abdominopelvic ascites. Musculoskeletal: Bilateral pars defects are noted at L5 with mild anterolisthesis at L5-S1. Degenerative changes are noted in the thoracolumbar spine. No acute osseous abnormality. IMPRESSION: 1. Cholelithiasis with a suggestion of gallbladder wall thickening. Ultrasound is recommended for further evaluation. 2. Scattered airspace opacities at the lung bases, possible atelectasis or infiltrate. 3. Diverticulosis without diverticulitis. 4. Cardiomegaly with coronary artery calcifications. 5. Aortic atherosclerosis. Electronically Signed   By: Thornell Sartorius M.D.   On: 10/21/2022 04:49   DG Chest 1 View  Result Date: 10/21/2022 CLINICAL DATA:  Abdominal pain with bilateral rales. EXAM: CHEST  1 VIEW COMPARISON:  October 25, 2021 FINDINGS:  There is stable dual lead AICD positioning. The cardiac silhouette is mildly enlarged and unchanged in size. Moderate severity diffusely increased interstitial lung markings are noted. Mild areas of superimposed atelectasis and/or infiltrate are seen within the right upper lobe and bilateral lung bases. No pleural effusion or pneumothorax is identified. The visualized skeletal structures are unremarkable. IMPRESSION: Moderate severity interstitial edema with mild areas of superimposed right upper lobe and bibasilar atelectasis and/or infiltrate. Electronically Signed   By: Aram Candela M.D.   On: 10/21/2022 02:48    Anti-infectives: Anti-infectives (From admission, onward)    Start     Dose/Rate Route Frequency Ordered Stop   10/22/22 1000  cefTRIAXone (ROCEPHIN) 2 g in sodium chloride 0.9 % 100 mL IVPB        2 g 200 mL/hr over 30 Minutes Intravenous Every 24 hours 10/21/22 0938     10/22/22 0800  metroNIDAZOLE (FLAGYL) IVPB 500 mg        500 mg 100 mL/hr over 60 Minutes Intravenous Every 12 hours 10/22/22 0717     10/21/22 0830  cefTRIAXone (ROCEPHIN) 2 g in sodium chloride 0.9 % 100 mL IVPB        2 g 200 mL/hr over 30 Minutes Intravenous  Once 10/21/22 0828 10/21/22 0933   10/21/22 0545  metroNIDAZOLE (FLAGYL) IVPB 500 mg        500 mg 100 mL/hr over 60 Minutes Intravenous  Once 10/21/22 0541 10/21/22 0719   10/21/22 0400  cefTRIAXone (ROCEPHIN) 1  g in sodium chloride 0.9 % 100 mL IVPB        1 g 200 mL/hr over 30 Minutes Intravenous  Once 10/21/22 0358 10/21/22 0549       Assessment/Plan  82 y/o F w/ a hx of HTN, HLD, HF w/ preserved EF, CAD, heart block s/p pacer, and DM who presented with abdominal pain and had imaging c/f cholecystitis   - Her picture this morning looks concerning for cholangitis.  Would consult GI to evaluate for possible ERCP.  Patient may not be a candidate for MRCP given pacer - Will discuss surgery versus perc chole pending GI evaluation.  Right now she  is high risk for surgical intervention given her chronic conditions acute illness - Surgery will continue to follow  I reviewed last 24 h vitals and pain scores, last 24 h labs and trends, and last 24 h imaging results.  This care required moderate level of medical decision making.    LOS: 1 day   T[facility] 10/22/2022, 10:06 AM Please see Amion for pager number during day hours 7:00am-4:30pm or 7:00am -11:30am on weekends

## 2022-10-22 NOTE — Progress Notes (Signed)
BRIEF PROGRESS NOTE:  Presented to evaluate patient for consultation of possible cholangitis and cholelithiasis. Patient not present in room, in radiology undergoing HIDA scan. MRCP is also ordered, though patient has pacemaker and uncertain if MRCP will be completed. Discussed HIDA scan and MRCP with patient's daughter in hospital room. All questions answered. Liver enzyme panel reviewed, transaminase elevation noted, do see some labile blood pressure including hypotension on chart review (this would be more in line with transaminase elevation in 1,000s rather than biliary obstruction). Has been afebrile since presentation. Leukocytosis now present. Full consultation to follow tomorrow when imaging completed and patient evaluated. Until then, follow up radiology results, continue IV antibiotic therapy (Rocephin, Flagyl), trend liver enzyme panel. Appreciate General Surgery evaluation.   Liliane Shi, DO [facility]

## 2022-10-22 NOTE — Progress Notes (Signed)
Heart Failure Navigator Progress Note  Assessed for Heart & Vascular TOC clinic readiness.  Patient does not meet criteria due to Cholangitis. Last EF 60-65%  Navigator will sign off at this time.  Roxy Horseman, RN, BSN [facility] Heart Failure Navigator Secure Chat Only

## 2022-10-23 ENCOUNTER — Inpatient Hospital Stay (HOSPITAL_COMMUNITY): Payer: Medicare Other

## 2022-10-23 DIAGNOSIS — Z950 Presence of cardiac pacemaker: Secondary | ICD-10-CM | POA: Diagnosis not present

## 2022-10-23 DIAGNOSIS — K819 Cholecystitis, unspecified: Secondary | ICD-10-CM

## 2022-10-23 LAB — CBC
HCT: 34.8 % — ABNORMAL LOW (ref 36.0–46.0)
Hemoglobin: 11.1 g/dL — ABNORMAL LOW (ref 12.0–15.0)
MCH: 32.3 pg (ref 26.0–34.0)
MCHC: 31.9 g/dL (ref 30.0–36.0)
MCV: 101.2 fL — ABNORMAL HIGH (ref 80.0–100.0)
Platelets: 234 10*3/uL (ref 150–400)
RBC: 3.44 MIL/uL — ABNORMAL LOW (ref 3.87–5.11)
RDW: 15.7 % — ABNORMAL HIGH (ref 11.5–15.5)
WBC: 13.2 10*3/uL — ABNORMAL HIGH (ref 4.0–10.5)
nRBC: 0 % (ref 0.0–0.2)

## 2022-10-23 LAB — COMPREHENSIVE METABOLIC PANEL
ALT: 478 U/L — ABNORMAL HIGH (ref 0–44)
AST: 303 U/L — ABNORMAL HIGH (ref 15–41)
Albumin: 2.3 g/dL — ABNORMAL LOW (ref 3.5–5.0)
Alkaline Phosphatase: 382 U/L — ABNORMAL HIGH (ref 38–126)
Anion gap: 10 (ref 5–15)
BUN: 48 mg/dL — ABNORMAL HIGH (ref 8–23)
CO2: 23 mmol/L (ref 22–32)
Calcium: 8.7 mg/dL — ABNORMAL LOW (ref 8.9–10.3)
Chloride: 102 mmol/L (ref 98–111)
Creatinine, Ser: 3.47 mg/dL — ABNORMAL HIGH (ref 0.44–1.00)
GFR, Estimated: 12 mL/min — ABNORMAL LOW (ref >60)
Glucose, Bld: 120 mg/dL — ABNORMAL HIGH (ref 70–99)
Potassium: 4.9 mmol/L (ref 3.5–5.1)
Sodium: 135 mmol/L (ref 135–145)
Total Bilirubin: 2.2 mg/dL — ABNORMAL HIGH (ref 0.3–1.2)
Total Protein: 6.1 g/dL — ABNORMAL LOW (ref 6.5–8.1)

## 2022-10-23 LAB — GLUCOSE, CAPILLARY
Glucose-Capillary: 111 mg/dL — ABNORMAL HIGH (ref 70–99)
Glucose-Capillary: 102 mg/dL — ABNORMAL HIGH (ref 70–99)
Glucose-Capillary: 170 mg/dL — ABNORMAL HIGH (ref 70–99)
Glucose-Capillary: 170 mg/dL — ABNORMAL HIGH (ref 70–99)

## 2022-10-23 MED ORDER — SODIUM CHLORIDE 0.9 % IV BOLUS
500.0000 mL | Freq: Once | INTRAVENOUS | Status: AC
  Administered 2022-10-23: 500 mL via INTRAVENOUS

## 2022-10-23 MED ORDER — SODIUM CHLORIDE 0.9 % IV SOLN: INTRAVENOUS | Status: AC

## 2022-10-23 MED ORDER — SODIUM CHLORIDE 0.9 % IV SOLN: INTRAVENOUS | Status: DC

## 2022-10-23 NOTE — TOC Initial Note (Signed)
Transition of Care (TOC) - Initial/Assessment Note   Spoke to patient and daughter Waynetta Sandy at bedside.   Confirmed face sheet information.   Patient and Waynetta Sandy live together   Patient has walker at home, shower has a seat.   TOC will continue to follow for discharge needs  Patient Details  Name: Connie Swanson MRN: [id number] Date of Birth: Aug 03, 1940  [facility]) CM/SW Contact:    Kingsley Plan, RN Phone Number: 10/23/2022, 2:48 PM  Clinical Narrative:                   Expected Discharge Plan: Home/Self Care Barriers to Discharge: Continued Medical Work up   Patient Goals and CMS Choice Patient states their goals for this hospitalization and ongoing recovery are:: to return to home          Expected Discharge Plan and Services   Discharge Planning Services: CM Consult Post Acute Care Choice: NA Living arrangements for the past 2 months: Single Family Home                 DME Arranged: N/A         HH Arranged: NA          Prior Living Arrangements/Services Living arrangements for the past 2 months: Single Family Home Lives with:: Adult Children Patient language and need for interpreter reviewed:: Yes Do you feel safe going back to the place where you live?: Yes      Need for Family Participation in Patient Care: Yes (Comment) Care giver support system in place?: Yes (comment) Current home services: DME Criminal Activity/Legal Involvement Pertinent to Current Situation/Hospitalization: No - Comment as needed  Activities of Daily Living   ADL Screening (condition at time of admission) Independently performs ADLs?: Yes (appropriate for developmental age) Is the patient deaf or have difficulty hearing?: No Does the patient have difficulty seeing, even when wearing glasses/contacts?: No Does the patient have difficulty concentrating, remembering, or making decisions?: No  Permission Sought/Granted   Permission granted to share information  with : Yes, Verbal Permission Granted  Share Information with NAME: daughter Waynetta Sandy           Emotional Assessment Appearance:: Appears stated age Attitude/Demeanor/Rapport: Engaged Affect (typically observed): Accepting Orientation: : Oriented to Self, Oriented to Place, Oriented to  Time, Oriented to Situation Alcohol / Substance Use: Not Applicable Psych Involvement: No (comment)  Admission diagnosis:  Choledocholithiasis [K80.50] Cholecystitis [K81.9] Community acquired pneumonia of right lower lobe of lung [J18.9] Patient Active Problem List   Diagnosis Date Noted   Cholelithiasis 10/21/2022   Pulmonary edema 10/21/2022   CAD (coronary artery disease) 10/21/2022   Hypertensive urgency 10/21/2022   Obesity (BMI 30-39.9) 10/21/2022   CKD (chronic kidney disease), stage IV (HCC) 10/21/2022   Preoperative cardiovascular examination 12/09/2021   Pacemaker Medtronic dual chamber Pacemaker Assurity Dr-Rf 10/24/2021 11/29/2021   Symptomatic advanced heart block 10/25/2021   Gout 10/24/2021   Vitamin D deficiency 10/24/2021   Complete heart block, transient (HCC) 10/24/2021   Symptomatic bradycardia    Complete heart block (HCC)    LBBB (left bundle branch block)    Leukocytosis    Acute on chronic heart failure with preserved ejection fraction (HCC)    Type 2 diabetes mellitus with hyperglycemia, with long-term current use of insulin (HCC)    Mixed hyperlipidemia    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 [place] [address] Phone: [phone] Fax: [phone]     Social Determinants of Health (SDOH) Social History: SDOH Screenings   Food Insecurity: No Food Insecurity  (10/21/2022)  Housing: Low Risk  (10/21/2022)  Transportation Needs: No Transportation Needs (10/21/2022)  Utilities: Not At Risk (10/21/2022)  Tobacco Use: Low Risk  (10/21/2022)   SDOH Interventions:     Readmission Risk Interventions     No data to display

## 2022-10-23 NOTE — Progress Notes (Signed)
PROGRESS NOTE    Connie Swanson  RUE:[id number] DOB: [birth year] DOA: 10/21/2022 PCP: Gaspar Garbe, MD  82/F with history of chronic diastolic CHF, CAD, complete heart block with pacer, type 2 diabetes mellitus, hypertension, dyslipidemia presented to the ED with right-sided and epigastric abdominal pain that started 10/11, associated with nausea and vomiting.  -In addition had cough and congestion for a few weeks, was on antibiotics for?  Pneumonia -In the ED she was afebrile, blood pressure 140s,, labs noted creatinine of 1.9, glucose 228, WBC 13, troponin 11,, chest x-ray with some edema and?  Infiltrate versus atelectasis, CT abdomen noted cholelithiasis with gallbladder wall thickening, opacities at lung bases -RUQ ultrasound with CBD dilation -10/14 a.m. worsening leukocytosis, kidney function, significantly elevated LFTs and bilirubin, GI consulted for ERCP, HIDA scan concerning for biliary obstruction -10/15, worsening kidney function, LFTs remain elevated  Subjective: -A little better overall, continues to have some right-sided abdominal pain  Assessment and Plan:  Cholelithiasis Cholangitis -Acute epigastric/right-sided abdominal pain with nausea vomiting, significantly worsening LFTs and bilirubin, WBC up to 21K, creatinine continues to worsen -CT abdomen pelvis with gallbladder wall thickening and cholelithiasis -RUQ ultrasound with CBD dilation -Clinically picture concerning for choledocholithiasis, cholangitis -[facility] by consulting, MRCP completed this morning, results pending -Continue ceftriaxone/metronidazole day 3   Acute on chronic diastolic CHF Hypoalbuminemia, 2.4 -Last echo 10/23 with EF 60-65%, indeterminate diastolic function  -Seen by cards, felt to be low risk for cholecystectomy -Holding diuretics, add IV fluids today with worsening AKI  AKI on CKD 3 -Baseline creatinine around 1.6-2, creatinine higher today, likely in the setting of above  issues -Hold diuretics, continue antibiotics, avoid hypotension, hold Entresto -Kidney function continues to worsen, add IV fluids today, avoid hypotension, nephrotoxic meds   Hypertensive urgency On admission blood pressures elevated up to 201/75. -Now BP trending low, hold Entresto, hydralazine, diuretics  Recent?  Pneumonia -Completed a course of Augmentin -Now on antibiotics as above for  Cholangitis, incentive spirometry  CAD -, No symptoms at this time, prior stress test back in 05/2020  was noted to be high risk because of her low LV function, but no reversible defect was appreciated.   -Appreciate cards input, repeat echo with improved EF -Continue statin   Diabetes mellitus type 2 with hyperglycemia -CBGs improving, continue Jardiance, A1c 7.6   Complete heart block s/p permanent pacemaker -has a Medtronic dual-chamber pacemaker in place since 10/2021.   Hyperlipidemia -Continue atorvastatin   Obesity BMI 37.41 kg/m     DVT prophylaxis: Heparin Advance Care Planning:   Code Status: Full Code     Consults: General Surgery   Family Communication: Daughter at bedside Disposition: Home pending improvement in above issues  Consultants: GI   Procedures:   Antimicrobials:    Objective: Vitals:   10/22/22 1953 10/23/22 0500 10/23/22 0505 10/23/22 0808  BP: (!) 105/44  (!) 112/50 (!) 132/55  Pulse: 89  84 84  Resp:    18  Temp: 98 F (36.7 C)  97.8 F (36.6 C) 98.7 F (37.1 C)  TempSrc: Oral  Oral Oral  SpO2: 95%  91% 96%  Weight:  (!) 156.7 kg    Height:        Intake/Output Summary (Last 24 hours) at 10/23/2022 1124 Last data filed at 10/23/2022 0900 Gross per 24 hour  Intake 540.15 ml  Output 125 ml  Net 415.15 ml   Filed Weights   10/21/22 0209 10/22/22 0500 10/23/22 0500  Weight: 89.8 kg  PROGRESS NOTE    Connie Swanson  RUE:[id number] DOB: [birth year] DOA: 10/21/2022 PCP: Gaspar Garbe, MD  82/F with history of chronic diastolic CHF, CAD, complete heart block with pacer, type 2 diabetes mellitus, hypertension, dyslipidemia presented to the ED with right-sided and epigastric abdominal pain that started 10/11, associated with nausea and vomiting.  -In addition had cough and congestion for a few weeks, was on antibiotics for?  Pneumonia -In the ED she was afebrile, blood pressure 140s,, labs noted creatinine of 1.9, glucose 228, WBC 13, troponin 11,, chest x-ray with some edema and?  Infiltrate versus atelectasis, CT abdomen noted cholelithiasis with gallbladder wall thickening, opacities at lung bases -RUQ ultrasound with CBD dilation -10/14 a.m. worsening leukocytosis, kidney function, significantly elevated LFTs and bilirubin, GI consulted for ERCP, HIDA scan concerning for biliary obstruction -10/15, worsening kidney function, LFTs remain elevated  Subjective: -A little better overall, continues to have some right-sided abdominal pain  Assessment and Plan:  Cholelithiasis Cholangitis -Acute epigastric/right-sided abdominal pain with nausea vomiting, significantly worsening LFTs and bilirubin, WBC up to 21K, creatinine continues to worsen -CT abdomen pelvis with gallbladder wall thickening and cholelithiasis -RUQ ultrasound with CBD dilation -Clinically picture concerning for choledocholithiasis, cholangitis -[facility] by consulting, MRCP completed this morning, results pending -Continue ceftriaxone/metronidazole day 3   Acute on chronic diastolic CHF Hypoalbuminemia, 2.4 -Last echo 10/23 with EF 60-65%, indeterminate diastolic function  -Seen by cards, felt to be low risk for cholecystectomy -Holding diuretics, add IV fluids today with worsening AKI  AKI on CKD 3 -Baseline creatinine around 1.6-2, creatinine higher today, likely in the setting of above  issues -Hold diuretics, continue antibiotics, avoid hypotension, hold Entresto -Kidney function continues to worsen, add IV fluids today, avoid hypotension, nephrotoxic meds   Hypertensive urgency On admission blood pressures elevated up to 201/75. -Now BP trending low, hold Entresto, hydralazine, diuretics  Recent?  Pneumonia -Completed a course of Augmentin -Now on antibiotics as above for  Cholangitis, incentive spirometry  CAD -, No symptoms at this time, prior stress test back in 05/2020  was noted to be high risk because of her low LV function, but no reversible defect was appreciated.   -Appreciate cards input, repeat echo with improved EF -Continue statin   Diabetes mellitus type 2 with hyperglycemia -CBGs improving, continue Jardiance, A1c 7.6   Complete heart block s/p permanent pacemaker -has a Medtronic dual-chamber pacemaker in place since 10/2021.   Hyperlipidemia -Continue atorvastatin   Obesity BMI 37.41 kg/m     DVT prophylaxis: Heparin Advance Care Planning:   Code Status: Full Code     Consults: General Surgery   Family Communication: Daughter at bedside Disposition: Home pending improvement in above issues  Consultants: GI   Procedures:   Antimicrobials:    Objective: Vitals:   10/22/22 1953 10/23/22 0500 10/23/22 0505 10/23/22 0808  BP: (!) 105/44  (!) 112/50 (!) 132/55  Pulse: 89  84 84  Resp:    18  Temp: 98 F (36.7 C)  97.8 F (36.6 C) 98.7 F (37.1 C)  TempSrc: Oral  Oral Oral  SpO2: 95%  91% 96%  Weight:  (!) 156.7 kg    Height:        Intake/Output Summary (Last 24 hours) at 10/23/2022 1124 Last data filed at 10/23/2022 0900 Gross per 24 hour  Intake 540.15 ml  Output 125 ml  Net 415.15 ml   Filed Weights   10/21/22 0209 10/22/22 0500 10/23/22 0500  Weight: 89.8 kg  PROGRESS NOTE    Connie Swanson  RUE:[id number] DOB: [birth year] DOA: 10/21/2022 PCP: Gaspar Garbe, MD  82/F with history of chronic diastolic CHF, CAD, complete heart block with pacer, type 2 diabetes mellitus, hypertension, dyslipidemia presented to the ED with right-sided and epigastric abdominal pain that started 10/11, associated with nausea and vomiting.  -In addition had cough and congestion for a few weeks, was on antibiotics for?  Pneumonia -In the ED she was afebrile, blood pressure 140s,, labs noted creatinine of 1.9, glucose 228, WBC 13, troponin 11,, chest x-ray with some edema and?  Infiltrate versus atelectasis, CT abdomen noted cholelithiasis with gallbladder wall thickening, opacities at lung bases -RUQ ultrasound with CBD dilation -10/14 a.m. worsening leukocytosis, kidney function, significantly elevated LFTs and bilirubin, GI consulted for ERCP, HIDA scan concerning for biliary obstruction -10/15, worsening kidney function, LFTs remain elevated  Subjective: -A little better overall, continues to have some right-sided abdominal pain  Assessment and Plan:  Cholelithiasis Cholangitis -Acute epigastric/right-sided abdominal pain with nausea vomiting, significantly worsening LFTs and bilirubin, WBC up to 21K, creatinine continues to worsen -CT abdomen pelvis with gallbladder wall thickening and cholelithiasis -RUQ ultrasound with CBD dilation -Clinically picture concerning for choledocholithiasis, cholangitis -[facility] by consulting, MRCP completed this morning, results pending -Continue ceftriaxone/metronidazole day 3   Acute on chronic diastolic CHF Hypoalbuminemia, 2.4 -Last echo 10/23 with EF 60-65%, indeterminate diastolic function  -Seen by cards, felt to be low risk for cholecystectomy -Holding diuretics, add IV fluids today with worsening AKI  AKI on CKD 3 -Baseline creatinine around 1.6-2, creatinine higher today, likely in the setting of above  issues -Hold diuretics, continue antibiotics, avoid hypotension, hold Entresto -Kidney function continues to worsen, add IV fluids today, avoid hypotension, nephrotoxic meds   Hypertensive urgency On admission blood pressures elevated up to 201/75. -Now BP trending low, hold Entresto, hydralazine, diuretics  Recent?  Pneumonia -Completed a course of Augmentin -Now on antibiotics as above for  Cholangitis, incentive spirometry  CAD -, No symptoms at this time, prior stress test back in 05/2020  was noted to be high risk because of her low LV function, but no reversible defect was appreciated.   -Appreciate cards input, repeat echo with improved EF -Continue statin   Diabetes mellitus type 2 with hyperglycemia -CBGs improving, continue Jardiance, A1c 7.6   Complete heart block s/p permanent pacemaker -has a Medtronic dual-chamber pacemaker in place since 10/2021.   Hyperlipidemia -Continue atorvastatin   Obesity BMI 37.41 kg/m     DVT prophylaxis: Heparin Advance Care Planning:   Code Status: Full Code     Consults: General Surgery   Family Communication: Daughter at bedside Disposition: Home pending improvement in above issues  Consultants: GI   Procedures:   Antimicrobials:    Objective: Vitals:   10/22/22 1953 10/23/22 0500 10/23/22 0505 10/23/22 0808  BP: (!) 105/44  (!) 112/50 (!) 132/55  Pulse: 89  84 84  Resp:    18  Temp: 98 F (36.7 C)  97.8 F (36.6 C) 98.7 F (37.1 C)  TempSrc: Oral  Oral Oral  SpO2: 95%  91% 96%  Weight:  (!) 156.7 kg    Height:        Intake/Output Summary (Last 24 hours) at 10/23/2022 1124 Last data filed at 10/23/2022 0900 Gross per 24 hour  Intake 540.15 ml  Output 125 ml  Net 415.15 ml   Filed Weights   10/21/22 0209 10/22/22 0500 10/23/22 0500  Weight: 89.8 kg  Status   Specimen Description BLOOD RIGHT HAND  Final   Special Requests   Final    BOTTLES DRAWN AEROBIC AND ANAEROBIC Blood Culture results may not be optimal due to an inadequate volume of blood received in culture bottles   Culture   Final    NO GROWTH 2 DAYS Performed at [facility], [address], [place], [place] [address]    Report Status PENDING  Incomplete     Radiology Studies: ECHOCARDIOGRAM COMPLETE  Result Date: 10/22/2022    ECHOCARDIOGRAM REPORT   Patient Name:   MERSADIES PETREE Date of Exam: 10/22/2022 Medical Rec #:  [id number]   Height:       61.0 in Accession #:    [id number]  Weight:       198.0 lb Date of Birth:  [birth year]   BSA:          1.881 m Patient Age:    82 years     BP:           134/55 mmHg Patient Gender: F           HR:           89 bpm. Exam Location:  Inpatient Procedure: 2D Echo, Color Doppler and Cardiac Doppler Indications:    acute diastolic chf  History:        Patient has prior history of Echocardiogram examinations, most                 recent 10/19/2021. Pacemaker, chronic kidney disease,                 Arrythmias:LBBB; Risk Factors:Diabetes, Hypertension and                 Dyslipidemia.  Sonographer:    Delcie Roch RDCS Referring Phys: [phone] RONDELL A SMITH  Sonographer Comments: Technically difficult study due to poor echo windows. Image acquisition challenging due to patient body habitus and Image acquisition challenging due to respiratory motion. IMPRESSIONS  1. Left ventricular ejection fraction, by estimation, is 70 to 75%. The left ventricle has hyperdynamic function. The left ventricle has no regional wall motion abnormalities. There is moderate concentric left ventricular hypertrophy. Left ventricular diastolic parameters are indeterminate.  2. Right ventricular systolic function is normal. The right ventricular size is mildly enlarged. There is moderately elevated pulmonary artery systolic pressure. The estimated right ventricular systolic pressure is 54.0 mmHg.  3. Left atrial size was mild to moderately dilated.  4. Right atrial size was mildly dilated.  5. The mitral valve is degenerative. No evidence of mitral valve regurgitation. No evidence of mitral stenosis. Moderate mitral annular calcification.  6. The aortic valve is tricuspid. There is mild calcification of the aortic valve. Aortic valve regurgitation is not visualized. Aortic valve sclerosis/calcification is present, without any evidence of aortic stenosis. Aortic valve area, by VTI measures  2.80 cm. Aortic valve mean gradient measures 8.0 mmHg. Aortic valve Vmax measures 2.11 m/s.  7. The inferior vena cava is normal in size with greater than 50% respiratory variability,  suggesting right atrial pressure of 3 mmHg. FINDINGS  Left Ventricle: Left ventricular ejection fraction, by estimation, is 70 to 75%. The left ventricle has hyperdynamic function. The left ventricle has no regional wall motion abnormalities. Definity contrast agent was given IV to delineate the left ventricular endocardial borders. The left ventricular internal cavity size was normal in size. There is moderate concentric left ventricular hypertrophy. Left ventricular diastolic parameters are  Status   Specimen Description BLOOD RIGHT HAND  Final   Special Requests   Final    BOTTLES DRAWN AEROBIC AND ANAEROBIC Blood Culture results may not be optimal due to an inadequate volume of blood received in culture bottles   Culture   Final    NO GROWTH 2 DAYS Performed at [facility], [address], [place], [place] [address]    Report Status PENDING  Incomplete     Radiology Studies: ECHOCARDIOGRAM COMPLETE  Result Date: 10/22/2022    ECHOCARDIOGRAM REPORT   Patient Name:   MERSADIES PETREE Date of Exam: 10/22/2022 Medical Rec #:  [id number]   Height:       61.0 in Accession #:    [id number]  Weight:       198.0 lb Date of Birth:  [birth year]   BSA:          1.881 m Patient Age:    82 years     BP:           134/55 mmHg Patient Gender: F           HR:           89 bpm. Exam Location:  Inpatient Procedure: 2D Echo, Color Doppler and Cardiac Doppler Indications:    acute diastolic chf  History:        Patient has prior history of Echocardiogram examinations, most                 recent 10/19/2021. Pacemaker, chronic kidney disease,                 Arrythmias:LBBB; Risk Factors:Diabetes, Hypertension and                 Dyslipidemia.  Sonographer:    Delcie Roch RDCS Referring Phys: [phone] RONDELL A SMITH  Sonographer Comments: Technically difficult study due to poor echo windows. Image acquisition challenging due to patient body habitus and Image acquisition challenging due to respiratory motion. IMPRESSIONS  1. Left ventricular ejection fraction, by estimation, is 70 to 75%. The left ventricle has hyperdynamic function. The left ventricle has no regional wall motion abnormalities. There is moderate concentric left ventricular hypertrophy. Left ventricular diastolic parameters are indeterminate.  2. Right ventricular systolic function is normal. The right ventricular size is mildly enlarged. There is moderately elevated pulmonary artery systolic pressure. The estimated right ventricular systolic pressure is 54.0 mmHg.  3. Left atrial size was mild to moderately dilated.  4. Right atrial size was mildly dilated.  5. The mitral valve is degenerative. No evidence of mitral valve regurgitation. No evidence of mitral stenosis. Moderate mitral annular calcification.  6. The aortic valve is tricuspid. There is mild calcification of the aortic valve. Aortic valve regurgitation is not visualized. Aortic valve sclerosis/calcification is present, without any evidence of aortic stenosis. Aortic valve area, by VTI measures  2.80 cm. Aortic valve mean gradient measures 8.0 mmHg. Aortic valve Vmax measures 2.11 m/s.  7. The inferior vena cava is normal in size with greater than 50% respiratory variability,  suggesting right atrial pressure of 3 mmHg. FINDINGS  Left Ventricle: Left ventricular ejection fraction, by estimation, is 70 to 75%. The left ventricle has hyperdynamic function. The left ventricle has no regional wall motion abnormalities. Definity contrast agent was given IV to delineate the left ventricular endocardial borders. The left ventricular internal cavity size was normal in size. There is moderate concentric left ventricular hypertrophy. Left ventricular diastolic parameters are

## 2022-10-23 NOTE — Consult Note (Signed)
[facility] Consult  Referring Provider: No ref. provider found Primary Care Physician:  Connie Swanson, Connie Koh, MD Primary Gastroenterologist: [facility] - Dr. Ewing Schlein   Reason for Consultation: cholangitis  SUBJECTIVE:   HPI: Connie Swanson is a 82 y.o. female with past medical history significant for hypertension, hyperlipidemia, heart failure with preserved ejection fraction, complete heart block status post permanent pacemaker placement. Noted that she began to have right sided abdominal pain in the evening of 10/19/2022. She denied fevers. She noted having nausea and emesis. She denied chest pain and shortness of breath. She has a cough. She noted a chronic history of constipation for which she takes Senna. She denied anticoagulant use. She denied prior gastric bypass surgery.   Labs showed AST/ALT 1,275/919, ALP 385, total bilirubin 2.5, BUN/Cr 35/2.74, Hgb 11.0, PLT 253. CT scan of abdomen and pelvis showed cholelithiasis, gallbladder wall thickening. Abdominal ultrasound showed 9.3 mm CBD proximally and 15 mm distally. HIDA scan showed no excretion of radiotracer from liver, biliary obstruction versus hepatic dysfunction. She did have hypotension during her hospitalization which is likely driving the hepatocellular changes in lab work as liver enzyme trend is improved today. She had MRCP completed today and results are pending.   Colonoscopy 2006 showed hyperplastic polyp.   Past Medical History:  Diagnosis Date   CHF (congestive heart failure) (HCC)    Chronic kidney disease    Complete heart block (HCC)    Coronary artery disease    Diabetes mellitus without complication (HCC)    Hyperlipidemia    Hypertension    Pacemaker Medtronic dual chamber Pacemaker Assurity Dr-Rf 10/24/2021 11/29/2021   Past Surgical History:  Procedure Laterality Date   BREAST EXCISIONAL BIOPSY Right    HEMORRHOID SURGERY     MENISCUS REPAIR     PACEMAKER IMPLANT N/A 10/24/2021   Procedure:  PACEMAKER IMPLANT;  Surgeon: Lanier Prude, MD;  Location: [facility] INVASIVE CV [facility];  Service: Cardiovascular;  Laterality: N/A;   THYROID SURGERY     VAGINAL HYSTERECTOMY     Prior to Admission medications   Medication Sig Start Date End Date Taking? Authorizing Provider  acetaminophen (TYLENOL) 325 MG tablet Take 2 tablets (650 mg total) by mouth every 4 (four) hours as needed for headache or mild pain. 10/25/21  Yes Sherie Don, NP  allopurinol (ZYLOPRIM) 300 MG tablet Take 300 mg by mouth daily.   Yes [provider]  amoxicillin-clavulanate (AUGMENTIN) 875-125 MG tablet Take 1 tablet by mouth every 12 (twelve) hours. 10/16/22  Yes [provider]  atorvastatin (LIPITOR) 40 MG tablet Take 40 mg by mouth daily. 03/04/18  Yes [provider]  bumetanide (BUMEX) 0.5 MG tablet Take 1 tablet (0.5 mg total) by mouth daily as needed. 05/25/22  Yes Tolia, Sunit, DO  Cholecalciferol (VITAMIN D3) 125 MCG (5000 UT) TABS Take 5,000 Units by mouth daily at 12 noon.   Yes [provider]  denosumab (PROLIA) 60 MG/ML SOSY injection Inject 60 mg into the skin every 6 (six) months.   Yes [provider]  empagliflozin (JARDIANCE) 10 MG TABS tablet Take 1 tablet (10 mg total) by mouth daily before breakfast. 11/09/21  Yes Sumeriski, Helmut Muster, RPH  hydrALAZINE (APRESOLINE) 25 MG tablet Take 1 tablet (25 mg total) by mouth 3 (three) times daily. 04/30/22  Yes Tolia, Sunit, DO  isosorbide mononitrate (IMDUR) 30 MG 24 hr tablet Take 1/2 (one-half) tablet by mouth once daily 07/10/22  Yes Tolia, Sunit, DO  metoprolol succinate (TOPROL-XL) 50  MG 24 hr tablet Take 1 tablet (50 mg total) by mouth daily. Take with or immediately following a meal. 10/26/21  Yes Riddle, Luella Cook, NP  sacubitril-valsartan (ENTRESTO) 24-26 MG Take 1 tablet by mouth 2 (two) times daily.   Yes [provider]  senna (SENOKOT) 8.6 MG tablet Take 1 tablet by mouth daily.   Yes [provider]   B-D INS SYR ULTRAFINE 1CC/30G 30G X 1/2" 1 ML MISC USE SYRINGE AS DIRECTED TWICE DAILY 08/26/19   [provider]   Current Facility-Administered Medications  Medication Dose Route Frequency Provider Last Rate Last Admin   acetaminophen (TYLENOL) tablet 650 mg  650 mg Oral Q6H PRN Clydie Braun, MD   650 mg at 10/22/22 1816   Or   acetaminophen (TYLENOL) suppository 650 mg  650 mg Rectal Q6H PRN Madelyn Flavors A, MD       albuterol (PROVENTIL) (2.5 MG/3ML) 0.083% nebulizer solution 2.5 mg  2.5 mg Nebulization Q6H PRN Madelyn Flavors A, MD       allopurinol (ZYLOPRIM) tablet 300 mg  300 mg Oral Daily Katrinka Blazing, Rondell A, MD   300 mg at 10/23/22 1004   atorvastatin (LIPITOR) tablet 40 mg  40 mg Oral Daily Smith, Rondell A, MD   40 mg at 10/23/22 1004   cefTRIAXone (ROCEPHIN) 2 g in sodium chloride 0.9 % 100 mL IVPB  2 g Intravenous Q24H Smith, Rondell A, MD 200 mL/hr at 10/23/22 1012 2 g at 10/23/22 1012   empagliflozin (JARDIANCE) tablet 10 mg  10 mg Oral QAC breakfast Madelyn Flavors A, MD   10 mg at 10/23/22 1004   guaiFENesin (MUCINEX) 12 hr tablet 600 mg  600 mg Oral BID Madelyn Flavors A, MD   600 mg at 10/23/22 1004   heparin injection 5,000 Units  5,000 Units Subcutaneous Q8H Smith, Rondell A, MD   5,000 Units at 10/23/22 0503   hydrALAZINE (APRESOLINE) injection 10 mg  10 mg Intravenous Q4H PRN Madelyn Flavors A, MD       HYDROmorphone (DILAUDID) injection 0.5-1 mg  0.5-1 mg Intravenous Q2H PRN Madelyn Flavors A, MD   1 mg at 10/23/22 1104   insulin aspart (novoLOG) injection 0-6 Units  0-6 Units Subcutaneous TID WC Smith, Rondell A, MD       metroNIDAZOLE (FLAGYL) IVPB 500 mg  500 mg Intravenous Q12H Zannie Cove, MD 100 mL/hr at 10/23/22 1017 500 mg at 10/23/22 1017   ondansetron (ZOFRAN) injection 4 mg  4 mg Intravenous Once PRN Madelyn Flavors A, MD       sodium chloride flush (NS) 0.9 % injection 3 mL  3 mL Intravenous Q12H Smith, Rondell A, MD   3 mL at 10/23/22 1005    Allergies as of 10/21/2022 - Review Complete 10/21/2022  Allergen Reaction Noted   Dapagliflozin Nausea And Vomiting 10/17/2021   Family History  Adopted: Yes   Social History   Socioeconomic History   Marital status: Widowed    Spouse name: Not on file   Number of children: 4   Years of education: Not on file   Highest education level: Not on file  Occupational History   Not on file  Tobacco Use   Smoking status: Never   Smokeless tobacco: Never  Vaping Use   Vaping status: Never Used  Substance and Sexual Activity   Alcohol use: Never   Drug use: Never   Sexual activity: Not on file  Other Topics Concern   Not on file  Social History Narrative   Not on file   Social Determinants of Health   Financial Resource Strain: Not on file  Food Insecurity: No Food Insecurity (10/21/2022)   Hunger Vital Sign    Worried About Running Out of Food in the Last Year: Never true    Ran Out of Food in the Last Year: Never true  Transportation Needs: No Transportation Needs (10/21/2022)   PRAPARE - Administrator, Civil Service (Medical): No    Lack of Transportation (Non-Medical): No  Physical Activity: Not on file  Stress: Not on file  Social Connections: Not on file  Intimate Partner Violence: Not At Risk (10/21/2022)   Humiliation, Afraid, Rape, and Kick questionnaire    Fear of Current or Ex-Partner: No    Emotionally Abused: No    Physically Abused: No    Sexually Abused: No   Review of Systems:  Review of Systems  Constitutional:  Negative for fever.  Respiratory:  Positive for cough. Negative for shortness of breath.   Cardiovascular:  Negative for chest pain.  Gastrointestinal:  Positive for abdominal pain, constipation, nausea and vomiting.    OBJECTIVE:   Temp:  [97.5 F (36.4 C)-98.7 F (37.1 C)] 98.7 F (37.1 C) (10/15 0808) Pulse Rate:  [83-89] 84 (10/15 0808) Resp:  [17-18] 18 (10/15 0808) BP: (105-134)/(44-55) 132/55 (10/15 0808) SpO2:   [91 %-96 %] 96 % (10/15 0808) Weight:  [156.7 kg] 156.7 kg (10/15 0500)   Physical Exam Constitutional:      General: She is not in acute distress.    Appearance: She is not ill-appearing, toxic-appearing or diaphoretic.  Cardiovascular:     Rate and Rhythm: Normal rate and regular rhythm.  Pulmonary:     Effort: No respiratory distress.     Breath sounds: Normal breath sounds.  Abdominal:     General: Bowel sounds are normal. There is no distension.     Palpations: Abdomen is soft.     Tenderness: There is abdominal tenderness.  Skin:    General: Skin is warm and dry.     Coloration: Skin is not jaundiced.  Neurological:     Mental Status: She is alert.     Labs: Recent Labs    10/21/22 0216 10/22/22 0302 10/23/22 0632  WBC 13.3* 21.4* 13.2*  HGB 11.4* 11.0* 11.1*  HCT 36.3 35.3* 34.8*  PLT 276 253 234   BMET Recent Labs    10/21/22 0216 10/22/22 0302 10/23/22 0632  NA 138 137 135  K 4.2 5.3* 4.9  CL 107 105 102  CO2 22 20* 23  GLUCOSE 228* 191* 120*  BUN 25* 35* 48*  CREATININE 1.90* 2.74* 3.47*  CALCIUM 8.7* 8.7* 8.7*   LFT Recent Labs    10/22/22 1848 10/23/22 0632  PROT 6.5 6.1*  ALBUMIN 2.5* 2.3*  AST 553* 303*  ALT 677* 478*  ALKPHOS 382* 382*  BILITOT 2.6* 2.2*  BILIDIR 2.0*  --   IBILI 0.6  --    PT/INR No results for input(s): "LABPROT", "INR" in the last 72 hours.  Diagnostic imaging: ECHOCARDIOGRAM COMPLETE  Result Date: 10/22/2022    ECHOCARDIOGRAM REPORT   Patient Name:   SYDNI JENSEN Date of Exam: 10/22/2022 Medical Rec #:  [id number]   Height:       61.0 in Accession #:    [id number]  Weight:       198.0 lb Date of Birth:  [birth year]   BSA:  1.881 m Patient Age:    82 years    BP:           134/55 mmHg Patient Gender: F           HR:           89 bpm. Exam Location:  Inpatient Procedure: 2D Echo, Color Doppler and Cardiac Doppler Indications:    acute diastolic chf  History:        Patient has prior history of Echocardiogram  examinations, most                 recent 10/19/2021. Pacemaker, chronic kidney disease,                 Arrythmias:LBBB; Risk Factors:Diabetes, Hypertension and                 Dyslipidemia.  Sonographer:    Delcie Roch RDCS Referring Phys: [phone] RONDELL A SMITH  Sonographer Comments: Technically difficult study due to poor echo windows. Image acquisition challenging due to patient body habitus and Image acquisition challenging due to respiratory motion. IMPRESSIONS  1. Left ventricular ejection fraction, by estimation, is 70 to 75%. The left ventricle has hyperdynamic function. The left ventricle has no regional wall motion abnormalities. There is moderate concentric left ventricular hypertrophy. Left ventricular diastolic parameters are indeterminate.  2. Right ventricular systolic function is normal. The right ventricular size is mildly enlarged. There is moderately elevated pulmonary artery systolic pressure. The estimated right ventricular systolic pressure is 54.0 mmHg.  3. Left atrial size was mild to moderately dilated.  4. Right atrial size was mildly dilated.  5. The mitral valve is degenerative. No evidence of mitral valve regurgitation. No evidence of mitral stenosis. Moderate mitral annular calcification.  6. The aortic valve is tricuspid. There is mild calcification of the aortic valve. Aortic valve regurgitation is not visualized. Aortic valve sclerosis/calcification is present, without any evidence of aortic stenosis. Aortic valve area, by VTI measures  2.80 cm. Aortic valve mean gradient measures 8.0 mmHg. Aortic valve Vmax measures 2.11 m/s.  7. The inferior vena cava is normal in size with greater than 50% respiratory variability, suggesting right atrial pressure of 3 mmHg. FINDINGS  Left Ventricle: Left ventricular ejection fraction, by estimation, is 70 to 75%. The left ventricle has hyperdynamic function. The left ventricle has no regional wall motion abnormalities. Definity contrast  agent was given IV to delineate the left ventricular endocardial borders. The left ventricular internal cavity size was normal in size. There is moderate concentric left ventricular hypertrophy. Left ventricular diastolic parameters are indeterminate. Right Ventricle: The right ventricular size is mildly enlarged. No increase in right ventricular wall thickness. Right ventricular systolic function is normal. There is moderately elevated pulmonary artery systolic pressure. The tricuspid regurgitant velocity is 3.57 m/s, and with an assumed right atrial pressure of 3 mmHg, the estimated right ventricular systolic pressure is 54.0 mmHg. Left Atrium: Left atrial size was mild to moderately dilated. Right Atrium: Right atrial size was mildly dilated. Pericardium: There is no evidence of pericardial effusion. Mitral Valve: The mitral valve is degenerative in appearance. Moderate mitral annular calcification. No evidence of mitral valve regurgitation. No evidence of mitral valve stenosis. MV peak gradient, 10.5 mmHg. The mean mitral valve gradient is 4.0 mmHg. Tricuspid Valve: The tricuspid valve is normal in structure. Tricuspid valve regurgitation is trivial. No evidence of tricuspid stenosis. Aortic Valve: The aortic valve is tricuspid. There is mild calcification of the aortic valve. Aortic  valve regurgitation is not visualized. Aortic valve sclerosis/calcification is present, without any evidence of aortic stenosis. Aortic valve mean gradient measures 8.0 mmHg. Aortic valve peak gradient measures 17.8 mmHg. Aortic valve area, by VTI measures 2.80 cm. Pulmonic Valve: The pulmonic valve was normal in structure. Pulmonic valve regurgitation is not visualized. No evidence of pulmonic stenosis. Aorta: The aortic root is normal in size and structure. Venous: The inferior vena cava is normal in size with greater than 50% respiratory variability, suggesting right atrial pressure of 3 mmHg. IAS/Shunts: No atrial level shunt  detected by color flow Doppler.  LEFT VENTRICLE PLAX 2D LV IVS:        1.50 cm   Diastology LVOT diam:     2.10 cm   LV e' medial:  8.27 cm/s LV SV:         92        LV e' lateral: 4.68 cm/s LV SV Index:   49 LVOT Area:     3.46 cm  RIGHT VENTRICLE             IVC RV S prime:     10.60 cm/s  IVC diam: 1.50 cm TAPSE (M-mode): 1.1 cm LEFT ATRIUM             Index        RIGHT ATRIUM           Index LA Vol (A2C):   62.1 ml 33.02 ml/m  RA Area:     15.30 cm LA Vol (A4C):   65.3 ml 34.72 ml/m  RA Volume:   38.50 ml  20.47 ml/m LA Biplane Vol: 69.3 ml 36.85 ml/m  AORTIC VALVE AV Area (Vmax):    2.76 cm AV Area (Vmean):   2.91 cm AV Area (VTI):     2.80 cm AV Vmax:           211.00 cm/s AV Vmean:          125.000 cm/s AV VTI:            0.330 m AV Peak Grad:      17.8 mmHg AV Mean Grad:      8.0 mmHg LVOT Vmax:         168.00 cm/s LVOT Vmean:        105.000 cm/s LVOT VTI:          0.267 m LVOT/AV VTI ratio: 0.81  AORTA Ao Root diam: 2.90 cm Ao Asc diam:  2.80 cm MITRAL VALVE             TRICUSPID VALVE MV Area VTI:  3.16 cm   TR Peak grad:   51.0 mmHg MV Peak grad: 10.5 mmHg  TR Vmax:        357.00 cm/s MV Mean grad: 4.0 mmHg MV Vmax:      1.62 m/s   SHUNTS MV Vmean:     90.3 cm/s  Systemic VTI:  0.27 m                          Systemic Diam: 2.10 cm Arvilla Meres MD Electronically signed by Arvilla Meres MD Signature Date/Time: 10/22/2022/5:54:53 PM    Final    NM Hepatobiliary Liver Func  Result Date: 10/22/2022 CLINICAL DATA:  Elevated bilirubin. Elevated liver enzymes. RIGHT upper quadrant pain nausea and vomiting. Indeterminate ultrasound of the RIGHT upper quadrant. EXAM: NUCLEAR MEDICINE HEPATOBILIARY IMAGING TECHNIQUE: Sequential images of the abdomen were obtained out  to 60 minutes following intravenous administration of radiopharmaceutical. RADIOPHARMACEUTICALS:  7.5 mCi Tc-88m  Choletec IV COMPARISON:  CT and ultrasound 10/21/2022 FINDINGS: Prompt clearance radiotracer from blood pool and  homogeneous uptake in liver. No evidence of biliary excretion is evident within the common bile duct over the 2 hour interval. As expected with no activity in the common bile duct, the gallbladder fails to fill. IMPRESSION: No excretion of radiotracer from the liver. Differential would include biliary obstruction versus hepatic dysfunction such as hepatitis. Favor hepatic dysfunction. Electronically Signed   By: Genevive Bi M.D.   On: 10/22/2022 15:26    IMPRESSION: Transaminase elevation in mixed pattern, likely multiple contributing factors including cholelithiasis, hypotension RUQ abdominal pain  Constipation History complete heart block status post pacemaker Heart failure with preserved ejection fraction  PLAN: -Trend liver enzyme panel -Follow up MRCP imaging results  -Discussed recommendations for ERCP if bile duct stone is present on MRCP including risks of bleeding/infection/perforation/pancreatitis/anesthesia, patient and her daughter at bedside verbalized understanding and agreed to proceed if needed -Ok for regular diet today  -Further recommendations to follow pending MRCP results    LOS: 2 days   Liliane Shi, [facility]

## 2022-10-23 NOTE — Progress Notes (Signed)
Subjective: Reports that her abdominal pain continues to improve. Tolerated a regular diet without increased abdominal pain or nausea.  MRCP pending  Tbili this morning Tbili 2.2 from 2.6, AST/ALT downtrending, Alk phos stable at 382 WBC 13 (21) Cr 3.5 (2.7)  ROS: See above, otherwise other systems negative  Objective: Vital signs in last 24 hours: Temp:  [97.5 F (36.4 C)-98.7 F (37.1 C)] 98.7 F (37.1 C) (10/15 0808) Pulse Rate:  [83-89] 84 (10/15 0808) Resp:  [17-18] 18 (10/15 0808) BP: (105-134)/(44-55) 132/55 (10/15 0808) SpO2:  [91 %-96 %] 96 % (10/15 0808) Weight:  [156.7 kg] 156.7 kg (10/15 0500)    Intake/Output from previous day: 10/14 0701 - 10/15 0700 In: 540.2 [P.O.:240; IV Piggyback:300.2] Out: 125 [Urine:125] Intake/Output this shift: No intake/output data recorded.  PE: Gen: elderly female, NAD Resp: equal chest rise CV: RRR Abd: soft, non-distended, TTP in the RUQ, + guarding, no peritoneal signs  Lab Results:  Recent Labs    10/22/22 0302 10/23/22 0632  WBC 21.4* 13.2*  HGB 11.0* 11.1*  HCT 35.3* 34.8*  PLT 253 234   BMET Recent Labs    10/22/22 0302 10/23/22 0632  NA 137 135  K 5.3* 4.9  CL 105 102  CO2 20* 23  GLUCOSE 191* 120*  BUN 35* 48*  CREATININE 2.74* 3.47*  CALCIUM 8.7* 8.7*   PT/INR No results for input(s): "LABPROT", "INR" in the last 72 hours. CMP     Component Value Date/Time   NA 135 10/23/2022 0632   NA 139 05/17/2022 1413   K 4.9 10/23/2022 0632   CL 102 10/23/2022 0632   CO2 23 10/23/2022 0632   GLUCOSE 120 (H) 10/23/2022 0632   BUN 48 (H) 10/23/2022 0632   BUN 29 (H) 05/17/2022 1413   CREATININE 3.47 (H) 10/23/2022 0632   CALCIUM 8.7 (L) 10/23/2022 0632   PROT 6.1 (L) 10/23/2022 0632   ALBUMIN 2.3 (L) 10/23/2022 0632   AST 303 (H) 10/23/2022 0632   ALT 478 (H) 10/23/2022 0632   ALKPHOS 382 (H) 10/23/2022 0632   BILITOT 2.2 (H) 10/23/2022 0632   GFRNONAA 12 (L) 10/23/2022 0632   Lipase      Component Value Date/Time   LIPASE 35 10/21/2022 0216    Studies/Results: ECHOCARDIOGRAM COMPLETE  Result Date: 10/22/2022    ECHOCARDIOGRAM REPORT   Patient Name:   Connie Swanson Date of Exam: 10/22/2022 Medical Rec #:  [id number]   Height:       61.0 in Accession #:    [id number]  Weight:       198.0 lb Date of Birth:  26-Nov-1940   BSA:          1.881 m Patient Age:    82 years    BP:           134/55 mmHg Patient Gender: F           HR:           89 bpm. Exam Location:  Inpatient Procedure: 2D Echo, Color Doppler and Cardiac Doppler Indications:    acute diastolic chf  History:        Patient has prior history of Echocardiogram examinations, most                 recent 10/19/2021. Pacemaker, chronic kidney disease,                 Arrythmias:LBBB; Risk Factors:Diabetes, Hypertension and  Dyslipidemia.  Sonographer:    Delcie Roch RDCS Referring Phys: [phone] RONDELL A SMITH  Sonographer Comments: Technically difficult study due to poor echo windows. Image acquisition challenging due to patient body habitus and Image acquisition challenging due to respiratory motion. IMPRESSIONS  1. Left ventricular ejection fraction, by estimation, is 70 to 75%. The left ventricle has hyperdynamic function. The left ventricle has no regional wall motion abnormalities. There is moderate concentric left ventricular hypertrophy. Left ventricular diastolic parameters are indeterminate.  2. Right ventricular systolic function is normal. The right ventricular size is mildly enlarged. There is moderately elevated pulmonary artery systolic pressure. The estimated right ventricular systolic pressure is 54.0 mmHg.  3. Left atrial size was mild to moderately dilated.  4. Right atrial size was mildly dilated.  5. The mitral valve is degenerative. No evidence of mitral valve regurgitation. No evidence of mitral stenosis. Moderate mitral annular calcification.  6. The aortic valve is tricuspid. There is mild  calcification of the aortic valve. Aortic valve regurgitation is not visualized. Aortic valve sclerosis/calcification is present, without any evidence of aortic stenosis. Aortic valve area, by VTI measures  2.80 cm. Aortic valve mean gradient measures 8.0 mmHg. Aortic valve Vmax measures 2.11 m/s.  7. The inferior vena cava is normal in size with greater than 50% respiratory variability, suggesting right atrial pressure of 3 mmHg. FINDINGS  Left Ventricle: Left ventricular ejection fraction, by estimation, is 70 to 75%. The left ventricle has hyperdynamic function. The left ventricle has no regional wall motion abnormalities. Definity contrast agent was given IV to delineate the left ventricular endocardial borders. The left ventricular internal cavity size was normal in size. There is moderate concentric left ventricular hypertrophy. Left ventricular diastolic parameters are indeterminate. Right Ventricle: The right ventricular size is mildly enlarged. No increase in right ventricular wall thickness. Right ventricular systolic function is normal. There is moderately elevated pulmonary artery systolic pressure. The tricuspid regurgitant velocity is 3.57 m/s, and with an assumed right atrial pressure of 3 mmHg, the estimated right ventricular systolic pressure is 54.0 mmHg. Left Atrium: Left atrial size was mild to moderately dilated. Right Atrium: Right atrial size was mildly dilated. Pericardium: There is no evidence of pericardial effusion. Mitral Valve: The mitral valve is degenerative in appearance. Moderate mitral annular calcification. No evidence of mitral valve regurgitation. No evidence of mitral valve stenosis. MV peak gradient, 10.5 mmHg. The mean mitral valve gradient is 4.0 mmHg. Tricuspid Valve: The tricuspid valve is normal in structure. Tricuspid valve regurgitation is trivial. No evidence of tricuspid stenosis. Aortic Valve: The aortic valve is tricuspid. There is mild calcification of the aortic  valve. Aortic valve regurgitation is not visualized. Aortic valve sclerosis/calcification is present, without any evidence of aortic stenosis. Aortic valve mean gradient measures 8.0 mmHg. Aortic valve peak gradient measures 17.8 mmHg. Aortic valve area, by VTI measures 2.80 cm. Pulmonic Valve: The pulmonic valve was normal in structure. Pulmonic valve regurgitation is not visualized. No evidence of pulmonic stenosis. Aorta: The aortic root is normal in size and structure. Venous: The inferior vena cava is normal in size with greater than 50% respiratory variability, suggesting right atrial pressure of 3 mmHg. IAS/Shunts: No atrial level shunt detected by color flow Doppler.  LEFT VENTRICLE PLAX 2D LV IVS:        1.50 cm   Diastology LVOT diam:     2.10 cm   LV e' medial:  8.27 cm/s LV SV:         92  LV e' lateral: 4.68 cm/s LV SV Index:   49 LVOT Area:     3.46 cm  RIGHT VENTRICLE             IVC RV S prime:     10.60 cm/s  IVC diam: 1.50 cm TAPSE (M-mode): 1.1 cm LEFT ATRIUM             Index        RIGHT ATRIUM           Index LA Vol (A2C):   62.1 ml 33.02 ml/m  RA Area:     15.30 cm LA Vol (A4C):   65.3 ml 34.72 ml/m  RA Volume:   38.50 ml  20.47 ml/m LA Biplane Vol: 69.3 ml 36.85 ml/m  AORTIC VALVE AV Area (Vmax):    2.76 cm AV Area (Vmean):   2.91 cm AV Area (VTI):     2.80 cm AV Vmax:           211.00 cm/s AV Vmean:          125.000 cm/s AV VTI:            0.330 m AV Peak Grad:      17.8 mmHg AV Mean Grad:      8.0 mmHg LVOT Vmax:         168.00 cm/s LVOT Vmean:        105.000 cm/s LVOT VTI:          0.267 m LVOT/AV VTI ratio: 0.81  AORTA Ao Root diam: 2.90 cm Ao Asc diam:  2.80 cm MITRAL VALVE             TRICUSPID VALVE MV Area VTI:  3.16 cm   TR Peak grad:   51.0 mmHg MV Peak grad: 10.5 mmHg  TR Vmax:        357.00 cm/s MV Mean grad: 4.0 mmHg MV Vmax:      1.62 m/s   SHUNTS MV Vmean:     90.3 cm/s  Systemic VTI:  0.27 m                          Systemic Diam: 2.10 cm Arvilla Meres MD  Electronically signed by Arvilla Meres MD Signature Date/Time: 10/22/2022/5:54:53 PM    Final    NM Hepatobiliary Liver Func  Result Date: 10/22/2022 CLINICAL DATA:  Elevated bilirubin. Elevated liver enzymes. RIGHT upper quadrant pain nausea and vomiting. Indeterminate ultrasound of the RIGHT upper quadrant. EXAM: NUCLEAR MEDICINE HEPATOBILIARY IMAGING TECHNIQUE: Sequential images of the abdomen were obtained out to 60 minutes following intravenous administration of radiopharmaceutical. RADIOPHARMACEUTICALS:  7.5 mCi Tc-44m  Choletec IV COMPARISON:  CT and ultrasound 10/21/2022 FINDINGS: Prompt clearance radiotracer from blood pool and homogeneous uptake in liver. No evidence of biliary excretion is evident within the common bile duct over the 2 hour interval. As expected with no activity in the common bile duct, the gallbladder fails to fill. IMPRESSION: No excretion of radiotracer from the liver. Differential would include biliary obstruction versus hepatic dysfunction such as hepatitis. Favor hepatic dysfunction. Electronically Signed   By: Genevive Bi M.D.   On: 10/22/2022 15:26    Anti-infectives: Anti-infectives (From admission, onward)    Start     Dose/Rate Route Frequency Ordered Stop   10/22/22 1000  cefTRIAXone (ROCEPHIN) 2 g in sodium chloride 0.9 % 100 mL IVPB        2 g 200 mL/hr over 30  Minutes Intravenous Every 24 hours 10/21/22 0938     10/22/22 0800  metroNIDAZOLE (FLAGYL) IVPB 500 mg        500 mg 100 mL/hr over 60 Minutes Intravenous Every 12 hours 10/22/22 0717     10/21/22 0830  cefTRIAXone (ROCEPHIN) 2 g in sodium chloride 0.9 % 100 mL IVPB        2 g 200 mL/hr over 30 Minutes Intravenous  Once 10/21/22 0828 10/21/22 0933   10/21/22 0545  metroNIDAZOLE (FLAGYL) IVPB 500 mg        500 mg 100 mL/hr over 60 Minutes Intravenous  Once 10/21/22 0541 10/21/22 0719   10/21/22 0400  cefTRIAXone (ROCEPHIN) 1 g in sodium chloride 0.9 % 100 mL IVPB        1 g 200 mL/hr  over 30 Minutes Intravenous  Once 10/21/22 0358 10/21/22 0549       Assessment/Plan  82 y/o F w/ a hx of HTN, HLD, HF w/ preserved EF, CAD, heart block s/p pacer, and DM who presented with abdominal pain and had imaging c/f cholecystitis   - Bilirubin remains elevated. MRCP pending. Will follow up the result to determine next steps pending GI plans - NPO at [facility]  I reviewed last 24 h vitals and pain scores, last 24 h labs and trends, and last 24 h imaging results.  This care required moderate level of medical decision making.    LOS: 2 days   [facility] 10/23/2022, 9:33 AM Please see Amion for pager number during day hours 7:00am-4:30pm or 7:00am -11:30am on weekends

## 2022-10-23 NOTE — Plan of Care (Signed)
  Problem: Education: Goal: Knowledge of cardiac device and self-care will improve Outcome: Progressing Goal: Ability to safely manage health related needs after discharge will improve Outcome: Progressing Goal: Individualized Educational Video(s) Outcome: Progressing   Problem: Cardiac: Goal: Ability to achieve and maintain adequate cardiopulmonary perfusion will improve Outcome: Progressing   Problem: Education: Goal: Ability to describe self-care measures that may prevent or decrease complications (Diabetes Survival Skills Education) will improve Outcome: Progressing Goal: Individualized Educational Video(s) Outcome: Progressing   Problem: Coping: Goal: Ability to adjust to condition or change in health will improve Outcome: Progressing   Problem: Fluid Volume: Goal: Ability to maintain a balanced intake and output will improve Outcome: Progressing   Problem: Health Behavior/Discharge Planning: Goal: Ability to identify and utilize available resources and services will improve Outcome: Progressing Goal: Ability to manage health-related needs will improve Outcome: Progressing   Problem: Metabolic: Goal: Ability to maintain appropriate glucose levels will improve Outcome: Progressing   Problem: Nutritional: Goal: Maintenance of adequate nutrition will improve Outcome: Progressing Goal: Progress toward achieving an optimal weight will improve Outcome: Progressing   Problem: Skin Integrity: Goal: Risk for impaired skin integrity will decrease Outcome: Progressing   Problem: Tissue Perfusion: Goal: Adequacy of tissue perfusion will improve Outcome: Progressing   Problem: Education: Goal: Knowledge of General Education information will improve Description: Including pain rating scale, medication(s)/side effects and non-pharmacologic comfort measures Outcome: Progressing   Problem: Health Behavior/Discharge Planning: Goal: Ability to manage health-related needs  will improve Outcome: Progressing   Problem: Clinical Measurements: Goal: Ability to maintain clinical measurements within normal limits will improve Outcome: Progressing Goal: Will remain free from infection Outcome: Progressing Goal: Diagnostic test results will improve Outcome: Progressing Goal: Respiratory complications will improve Outcome: Progressing Goal: Cardiovascular complication will be avoided Outcome: Progressing   Problem: Activity: Goal: Risk for activity intolerance will decrease Outcome: Progressing   Problem: Nutrition: Goal: Adequate nutrition will be maintained Outcome: Progressing   Problem: Coping: Goal: Level of anxiety will decrease Outcome: Progressing   Problem: Elimination: Goal: Will not experience complications related to bowel motility Outcome: Progressing Goal: Will not experience complications related to urinary retention Outcome: Progressing   Problem: Pain Managment: Goal: General experience of comfort will improve Outcome: Progressing   Problem: Safety: Goal: Ability to remain free from injury will improve Outcome: Progressing   Problem: Skin Integrity: Goal: Risk for impaired skin integrity will decrease Outcome: Progressing

## 2022-10-23 NOTE — Progress Notes (Signed)
Cardiologist:  Tolia/Lambert  Subjective:  Denies SSCP, palpitations or Dyspnea RUQ pain persists Just given dilaudid   Objective:  Vitals:   10/22/22 1953 10/23/22 0500 10/23/22 0505 10/23/22 0808  BP: (!) 105/44  (!) 112/50 (!) 132/55  Pulse: 89  84 84  Resp:    18  Temp: 98 F (36.7 C)  97.8 F (36.6 C) 98.7 F (37.1 C)  TempSrc: Oral  Oral Oral  SpO2: 95%  91% 96%  Weight:  (!) 156.7 kg    Height:        Intake/Output from previous day:  Intake/Output Summary (Last 24 hours) at 10/23/2022 0836 Last data filed at 10/23/2022 0546 Gross per 24 hour  Intake 540.15 ml  Output 125 ml  Net 415.15 ml    Physical Exam:  Elderly female Lungs clear No murmur PPM under left clavicle RUQ pain to palpation no rebound Trace edema  Lab Results: Basic Metabolic Panel: Recent Labs    10/22/22 0302 10/23/22 0632  NA 137 135  K 5.3* 4.9  CL 105 102  CO2 20* 23  GLUCOSE 191* 120*  BUN 35* 48*  CREATININE 2.74* 3.47*  CALCIUM 8.7* 8.7*   Liver Function Tests: Recent Labs    10/22/22 1848 10/23/22 0632  AST 553* 303*  ALT 677* 478*  ALKPHOS 382* 382*  BILITOT 2.6* 2.2*  PROT 6.5 6.1*  ALBUMIN 2.5* 2.3*   Recent Labs    10/21/22 0216  LIPASE 35   CBC: Recent Labs    10/22/22 0302 10/23/22 0632  WBC 21.4* 13.2*  HGB 11.0* 11.1*  HCT 35.3* 34.8*  MCV 102.9* 101.2*  PLT 253 234   Cardiac Enzymes: No results for input(s): "CKTOTAL", "CKMB", "CKMBINDEX", "TROPONINI" in the last 72 hours. BNP: Invalid input(s): "POCBNP" D-Dimer: No results for input(s): "DDIMER" in the last 72 hours. Hemoglobin A1C: Recent Labs    10/21/22 1436  HGBA1C 7.6*    Imaging: ECHOCARDIOGRAM COMPLETE  Result Date: 10/22/2022    ECHOCARDIOGRAM REPORT   Patient Name:   Connie Swanson Date of Exam: 10/22/2022 Medical Rec #:  [id number]   Height:       61.0 in Accession #:    [id number]  Weight:       198.0 lb Date of Birth:  09/17/1940   BSA:          1.881 m Patient  Age:    82 years    BP:           134/55 mmHg Patient Gender: F           HR:           89 bpm. Exam Location:  Inpatient Procedure: 2D Echo, Color Doppler and Cardiac Doppler Indications:    acute diastolic chf  History:        Patient has prior history of Echocardiogram examinations, most                 recent 10/19/2021. Pacemaker, chronic kidney disease,                 Arrythmias:LBBB; Risk Factors:Diabetes, Hypertension and                 Dyslipidemia.  Sonographer:    Delcie Roch RDCS Referring Phys: [phone] RONDELL A SMITH  Sonographer Comments: Technically difficult study due to poor echo windows. Image acquisition challenging due to patient body habitus and Image acquisition challenging due to respiratory motion. IMPRESSIONS  1. Left  ventricular ejection fraction, by estimation, is 70 to 75%. The left ventricle has hyperdynamic function. The left ventricle has no regional wall motion abnormalities. There is moderate concentric left ventricular hypertrophy. Left ventricular diastolic parameters are indeterminate.  2. Right ventricular systolic function is normal. The right ventricular size is mildly enlarged. There is moderately elevated pulmonary artery systolic pressure. The estimated right ventricular systolic pressure is 54.0 mmHg.  3. Left atrial size was mild to moderately dilated.  4. Right atrial size was mildly dilated.  5. The mitral valve is degenerative. No evidence of mitral valve regurgitation. No evidence of mitral stenosis. Moderate mitral annular calcification.  6. The aortic valve is tricuspid. There is mild calcification of the aortic valve. Aortic valve regurgitation is not visualized. Aortic valve sclerosis/calcification is present, without any evidence of aortic stenosis. Aortic valve area, by VTI measures  2.80 cm. Aortic valve mean gradient measures 8.0 mmHg. Aortic valve Vmax measures 2.11 m/s.  7. The inferior vena cava is normal in size with greater than 50% respiratory  variability, suggesting right atrial pressure of 3 mmHg. FINDINGS  Left Ventricle: Left ventricular ejection fraction, by estimation, is 70 to 75%. The left ventricle has hyperdynamic function. The left ventricle has no regional wall motion abnormalities. Definity contrast agent was given IV to delineate the left ventricular endocardial borders. The left ventricular internal cavity size was normal in size. There is moderate concentric left ventricular hypertrophy. Left ventricular diastolic parameters are indeterminate. Right Ventricle: The right ventricular size is mildly enlarged. No increase in right ventricular wall thickness. Right ventricular systolic function is normal. There is moderately elevated pulmonary artery systolic pressure. The tricuspid regurgitant velocity is 3.57 m/s, and with an assumed right atrial pressure of 3 mmHg, the estimated right ventricular systolic pressure is 54.0 mmHg. Left Atrium: Left atrial size was mild to moderately dilated. Right Atrium: Right atrial size was mildly dilated. Pericardium: There is no evidence of pericardial effusion. Mitral Valve: The mitral valve is degenerative in appearance. Moderate mitral annular calcification. No evidence of mitral valve regurgitation. No evidence of mitral valve stenosis. MV peak gradient, 10.5 mmHg. The mean mitral valve gradient is 4.0 mmHg. Tricuspid Valve: The tricuspid valve is normal in structure. Tricuspid valve regurgitation is trivial. No evidence of tricuspid stenosis. Aortic Valve: The aortic valve is tricuspid. There is mild calcification of the aortic valve. Aortic valve regurgitation is not visualized. Aortic valve sclerosis/calcification is present, without any evidence of aortic stenosis. Aortic valve mean gradient measures 8.0 mmHg. Aortic valve peak gradient measures 17.8 mmHg. Aortic valve area, by VTI measures 2.80 cm. Pulmonic Valve: The pulmonic valve was normal in structure. Pulmonic valve regurgitation is not  visualized. No evidence of pulmonic stenosis. Aorta: The aortic root is normal in size and structure. Venous: The inferior vena cava is normal in size with greater than 50% respiratory variability, suggesting right atrial pressure of 3 mmHg. IAS/Shunts: No atrial level shunt detected by color flow Doppler.  LEFT VENTRICLE PLAX 2D LV IVS:        1.50 cm   Diastology LVOT diam:     2.10 cm   LV e' medial:  8.27 cm/s LV SV:         92        LV e' lateral: 4.68 cm/s LV SV Index:   49 LVOT Area:     3.46 cm  RIGHT VENTRICLE             IVC RV S prime:  10.60 cm/s  IVC diam: 1.50 cm TAPSE (M-mode): 1.1 cm LEFT ATRIUM             Index        RIGHT ATRIUM           Index LA Vol (A2C):   62.1 ml 33.02 ml/m  RA Area:     15.30 cm LA Vol (A4C):   65.3 ml 34.72 ml/m  RA Volume:   38.50 ml  20.47 ml/m LA Biplane Vol: 69.3 ml 36.85 ml/m  AORTIC VALVE AV Area (Vmax):    2.76 cm AV Area (Vmean):   2.91 cm AV Area (VTI):     2.80 cm AV Vmax:           211.00 cm/s AV Vmean:          125.000 cm/s AV VTI:            0.330 m AV Peak Grad:      17.8 mmHg AV Mean Grad:      8.0 mmHg LVOT Vmax:         168.00 cm/s LVOT Vmean:        105.000 cm/s LVOT VTI:          0.267 m LVOT/AV VTI ratio: 0.81  AORTA Ao Root diam: 2.90 cm Ao Asc diam:  2.80 cm MITRAL VALVE             TRICUSPID VALVE MV Area VTI:  3.16 cm   TR Peak grad:   51.0 mmHg MV Peak grad: 10.5 mmHg  TR Vmax:        357.00 cm/s MV Mean grad: 4.0 mmHg MV Vmax:      1.62 m/s   SHUNTS MV Vmean:     90.3 cm/s  Systemic VTI:  0.27 m                          Systemic Diam: 2.10 cm Arvilla Meres MD Electronically signed by Arvilla Meres MD Signature Date/Time: 10/22/2022/5:54:53 PM    Final    NM Hepatobiliary Liver Func  Result Date: 10/22/2022 CLINICAL DATA:  Elevated bilirubin. Elevated liver enzymes. RIGHT upper quadrant pain nausea and vomiting. Indeterminate ultrasound of the RIGHT upper quadrant. EXAM: NUCLEAR MEDICINE HEPATOBILIARY IMAGING TECHNIQUE:  Sequential images of the abdomen were obtained out to 60 minutes following intravenous administration of radiopharmaceutical. RADIOPHARMACEUTICALS:  7.5 mCi Tc-60m  Choletec IV COMPARISON:  CT and ultrasound 10/21/2022 FINDINGS: Prompt clearance radiotracer from blood pool and homogeneous uptake in liver. No evidence of biliary excretion is evident within the common bile duct over the 2 hour interval. As expected with no activity in the common bile duct, the gallbladder fails to fill. IMPRESSION: No excretion of radiotracer from the liver. Differential would include biliary obstruction versus hepatic dysfunction such as hepatitis. Favor hepatic dysfunction. Electronically Signed   By: Genevive Bi M.D.   On: 10/22/2022 15:26    Cardiac Studies:  ECG: A sense V pace rate 76    Telemetry:  Echo: 10/13 EF 70-75% moderate LVH indeterminate diastolic parameters   Medications:    allopurinol  300 mg Oral Daily   atorvastatin  40 mg Oral Daily   empagliflozin  10 mg Oral QAC breakfast   guaiFENesin  600 mg Oral BID   heparin  5,000 Units Subcutaneous Q8H   insulin aspart  0-6 Units Subcutaneous TID WC   sodium chloride flush  3 mL Intravenous Q12H  cefTRIAXone (ROCEPHIN)  IV 2 g (10/22/22 0957)   metronidazole 500 mg (10/22/22 1959)    Assessment/Plan:   PPM:  MRI compatible Assurity device  DDD back up rate 60 Does not appear PPM dependant with only 9% V pacing Have messaged EP regarding MRERCP today make sure no reprogramming needs to be done but device is MRI safe  can call Janean Sark EP PA with any questons [phone] or Dr Lalla Brothers [phone] Diastolic dysfunction:  no volume overload  Cholycystitis:  HIDA positive on Rocephin per general surgery and GI HLD:  on statin   Charlton Haws 10/23/2022, 8:36 AM

## 2022-10-23 NOTE — Plan of Care (Signed)
  Problem: Education: Goal: Knowledge of cardiac device and self-care will improve Outcome: Progressing Goal: Ability to safely manage health related needs after discharge will improve Outcome: Progressing   Problem: Coping: Goal: Ability to adjust to condition or change in health will improve Outcome: Progressing   Problem: Metabolic: Goal: Ability to maintain appropriate glucose levels will improve Outcome: Progressing

## 2022-10-24 ENCOUNTER — Inpatient Hospital Stay (HOSPITAL_COMMUNITY): Payer: Medicare Other | Admitting: Anesthesiology

## 2022-10-24 ENCOUNTER — Encounter (HOSPITAL_COMMUNITY)
Admission: EM | Disposition: A | Discharge status: Home Health | Payer: Self-pay | Source: Home / Self Care | Attending: Internal Medicine

## 2022-10-24 ENCOUNTER — Other Ambulatory Visit: Payer: Self-pay

## 2022-10-24 ENCOUNTER — Inpatient Hospital Stay (HOSPITAL_COMMUNITY): Payer: Medicare Other

## 2022-10-24 ENCOUNTER — Encounter (HOSPITAL_COMMUNITY): Payer: Self-pay | Admitting: Internal Medicine

## 2022-10-24 ENCOUNTER — Ambulatory Visit: Payer: Self-pay | Admitting: General Surgery

## 2022-10-24 DIAGNOSIS — E785 Hyperlipidemia, unspecified: Secondary | ICD-10-CM

## 2022-10-24 DIAGNOSIS — I5033 Acute on chronic diastolic (congestive) heart failure: Secondary | ICD-10-CM | POA: Diagnosis not present

## 2022-10-24 DIAGNOSIS — N184 Chronic kidney disease, stage 4 (severe): Secondary | ICD-10-CM | POA: Diagnosis not present

## 2022-10-24 DIAGNOSIS — K810 Acute cholecystitis: Secondary | ICD-10-CM

## 2022-10-24 DIAGNOSIS — I160 Hypertensive urgency: Secondary | ICD-10-CM | POA: Diagnosis not present

## 2022-10-24 DIAGNOSIS — E66813 Obesity, class 3: Secondary | ICD-10-CM

## 2022-10-24 DIAGNOSIS — Z01810 Encounter for preprocedural cardiovascular examination: Secondary | ICD-10-CM | POA: Diagnosis not present

## 2022-10-24 DIAGNOSIS — E1169 Type 2 diabetes mellitus with other specified complication: Secondary | ICD-10-CM

## 2022-10-24 DIAGNOSIS — Z950 Presence of cardiac pacemaker: Secondary | ICD-10-CM | POA: Diagnosis not present

## 2022-10-24 HISTORY — PX: CHOLECYSTECTOMY: SHX55

## 2022-10-24 LAB — COMPREHENSIVE METABOLIC PANEL
ALT: 290 U/L — ABNORMAL HIGH (ref 0–44)
AST: 150 U/L — ABNORMAL HIGH (ref 15–41)
Albumin: 2.2 g/dL — ABNORMAL LOW (ref 3.5–5.0)
Alkaline Phosphatase: 429 U/L — ABNORMAL HIGH (ref 38–126)
Anion gap: 12 (ref 5–15)
BUN: 53 mg/dL — ABNORMAL HIGH (ref 8–23)
CO2: 21 mmol/L — ABNORMAL LOW (ref 22–32)
Calcium: 8.3 mg/dL — ABNORMAL LOW (ref 8.9–10.3)
Chloride: 102 mmol/L (ref 98–111)
Creatinine, Ser: 3.37 mg/dL — ABNORMAL HIGH (ref 0.44–1.00)
GFR, Estimated: 13 mL/min — ABNORMAL LOW (ref >60)
Glucose, Bld: 167 mg/dL — ABNORMAL HIGH (ref 70–99)
Potassium: 4.6 mmol/L (ref 3.5–5.1)
Sodium: 135 mmol/L (ref 135–145)
Total Bilirubin: 1 mg/dL (ref 0.3–1.2)
Total Protein: 5.9 g/dL — ABNORMAL LOW (ref 6.5–8.1)

## 2022-10-24 LAB — GLUCOSE, CAPILLARY
Glucose-Capillary: 157 mg/dL — ABNORMAL HIGH (ref 70–99)
Glucose-Capillary: 143 mg/dL — ABNORMAL HIGH (ref 70–99)
Glucose-Capillary: 138 mg/dL — ABNORMAL HIGH (ref 70–99)
Glucose-Capillary: 172 mg/dL — ABNORMAL HIGH (ref 70–99)
Glucose-Capillary: 183 mg/dL — ABNORMAL HIGH (ref 70–99)

## 2022-10-24 LAB — CBC
HCT: 32.8 % — ABNORMAL LOW (ref 36.0–46.0)
Hemoglobin: 10.5 g/dL — ABNORMAL LOW (ref 12.0–15.0)
MCH: 31.9 pg (ref 26.0–34.0)
MCHC: 32 g/dL (ref 30.0–36.0)
MCV: 99.7 fL (ref 80.0–100.0)
Platelets: 228 10*3/uL (ref 150–400)
RBC: 3.29 MIL/uL — ABNORMAL LOW (ref 3.87–5.11)
RDW: 15.9 % — ABNORMAL HIGH (ref 11.5–15.5)
WBC: 9.7 10*3/uL (ref 4.0–10.5)
nRBC: 0 % (ref 0.0–0.2)

## 2022-10-24 SURGERY — LAPAROSCOPIC CHOLECYSTECTOMY WITH INTRAOPERATIVE CHOLANGIOGRAM
Anesthesia: General | Site: Abdomen

## 2022-10-24 MED ORDER — ROCURONIUM BROMIDE 10 MG/ML (PF) SYRINGE
PREFILLED_SYRINGE | INTRAVENOUS | Status: DC | PRN
  Administered 2022-10-24: 10 mg via INTRAVENOUS
  Administered 2022-10-24: 60 mg via INTRAVENOUS
  Administered 2022-10-24: 40 mg via INTRAVENOUS

## 2022-10-24 MED ORDER — BUPIVACAINE-EPINEPHRINE 0.25% -1:200000 IJ SOLN
INTRAMUSCULAR | Status: DC | PRN
  Administered 2022-10-24: 15 mL

## 2022-10-24 MED ORDER — PROPOFOL 10 MG/ML IV BOLUS
INTRAVENOUS | Status: DC | PRN
  Administered 2022-10-24: 170 mg via INTRAVENOUS

## 2022-10-24 MED ORDER — CHLORHEXIDINE GLUCONATE 0.12 % MT SOLN: 15.0000 mL | Freq: Once | OROMUCOSAL | Status: AC

## 2022-10-24 MED ORDER — DEXAMETHASONE SODIUM PHOSPHATE 10 MG/ML IJ SOLN
INTRAMUSCULAR | Status: DC | PRN
  Administered 2022-10-24: 5 mg via INTRAVENOUS

## 2022-10-24 MED ORDER — ONDANSETRON HCL 4 MG/2ML IJ SOLN
INTRAMUSCULAR | Status: AC
  Filled 2022-10-24: qty 2

## 2022-10-24 MED ORDER — CHLORHEXIDINE GLUCONATE CLOTH 2 % EX PADS
6.0000 | MEDICATED_PAD | Freq: Once | CUTANEOUS | Status: DC
  Administered 2022-10-24: 6 via TOPICAL

## 2022-10-24 MED ORDER — INDOCYANINE GREEN 25 MG IV SOLR
2.5000 mg | Freq: Once | INTRAVENOUS | Status: AC
  Administered 2022-10-24: 2.5 mg via INTRAVENOUS
  Filled 2022-10-24: qty 10

## 2022-10-24 MED ORDER — ROCURONIUM BROMIDE 10 MG/ML (PF) SYRINGE
PREFILLED_SYRINGE | INTRAVENOUS | Status: AC
  Filled 2022-10-24: qty 10

## 2022-10-24 MED ORDER — OXYCODONE HCL 5 MG/5ML PO SOLN: 5.0000 mg | Freq: Once | ORAL | Status: DC | PRN

## 2022-10-24 MED ORDER — DEXAMETHASONE SODIUM PHOSPHATE 10 MG/ML IJ SOLN
INTRAMUSCULAR | Status: AC
  Filled 2022-10-24: qty 1

## 2022-10-24 MED ORDER — FENTANYL CITRATE (PF) 250 MCG/5ML IJ SOLN
INTRAMUSCULAR | Status: AC
  Filled 2022-10-24: qty 5

## 2022-10-24 MED ORDER — INSULIN ASPART 100 UNIT/ML IJ SOLN: 0.0000 [IU] | INTRAMUSCULAR | Status: DC | PRN

## 2022-10-24 MED ORDER — PHENYLEPHRINE HCL-NACL 20-0.9 MG/250ML-% IV SOLN
INTRAVENOUS | Status: DC | PRN
  Administered 2022-10-24: 40 ug/min via INTRAVENOUS

## 2022-10-24 MED ORDER — 0.9 % SODIUM CHLORIDE (POUR BTL) OPTIME
TOPICAL | Status: DC | PRN
  Administered 2022-10-24: 1000 mL

## 2022-10-24 MED ORDER — ONDANSETRON HCL 4 MG/2ML IJ SOLN
INTRAMUSCULAR | Status: DC | PRN
  Administered 2022-10-24: 4 mg via INTRAVENOUS

## 2022-10-24 MED ORDER — LIDOCAINE 2% (20 MG/ML) 5 ML SYRINGE
INTRAMUSCULAR | Status: AC
  Filled 2022-10-24: qty 5

## 2022-10-24 MED ORDER — LIDOCAINE 2% (20 MG/ML) 5 ML SYRINGE
INTRAMUSCULAR | Status: DC | PRN
  Administered 2022-10-24: 100 mg via INTRAVENOUS

## 2022-10-24 MED ORDER — FENTANYL CITRATE (PF) 250 MCG/5ML IJ SOLN
INTRAMUSCULAR | Status: DC | PRN
  Administered 2022-10-24 (×3): 50 ug via INTRAVENOUS

## 2022-10-24 MED ORDER — ORAL CARE MOUTH RINSE: 15.0000 mL | Freq: Once | OROMUCOSAL | Status: AC

## 2022-10-24 MED ORDER — DROPERIDOL 2.5 MG/ML IJ SOLN: 0.6250 mg | Freq: Once | INTRAMUSCULAR | Status: DC | PRN

## 2022-10-24 MED ORDER — OXYCODONE HCL 5 MG PO TABS: 5.0000 mg | ORAL_TABLET | Freq: Once | ORAL | Status: DC | PRN

## 2022-10-24 MED ORDER — BUPIVACAINE-EPINEPHRINE (PF) 0.25% -1:200000 IJ SOLN
INTRAMUSCULAR | Status: AC
  Filled 2022-10-24: qty 30

## 2022-10-24 MED ORDER — PHENYLEPHRINE 80 MCG/ML (10ML) SYRINGE FOR IV PUSH (FOR BLOOD PRESSURE SUPPORT)
PREFILLED_SYRINGE | INTRAVENOUS | Status: DC | PRN
  Administered 2022-10-24: 80 ug via INTRAVENOUS

## 2022-10-24 MED ORDER — SUGAMMADEX SODIUM 200 MG/2ML IV SOLN
INTRAVENOUS | Status: DC | PRN
  Administered 2022-10-24: 400 mg via INTRAVENOUS

## 2022-10-24 MED ORDER — CHLORHEXIDINE GLUCONATE CLOTH 2 % EX PADS: 6.0000 | MEDICATED_PAD | Freq: Once | CUTANEOUS | Status: DC

## 2022-10-24 MED ORDER — CHLORHEXIDINE GLUCONATE 0.12 % MT SOLN
OROMUCOSAL | Status: AC
  Administered 2022-10-24: 15 mL via OROMUCOSAL
  Filled 2022-10-24: qty 15

## 2022-10-24 MED ORDER — SODIUM CHLORIDE 0.9 % IV SOLN
INTRAVENOUS | Status: DC | PRN
Start: 2022-10-24 — End: 2022-10-24

## 2022-10-24 MED ORDER — FENTANYL CITRATE (PF) 100 MCG/2ML IJ SOLN: 25.0000 ug | INTRAMUSCULAR | Status: DC | PRN

## 2022-10-24 MED ORDER — SODIUM CHLORIDE 0.9 % IR SOLN
Status: DC | PRN
  Administered 2022-10-24: 1000 mL

## 2022-10-24 MED ORDER — ACETAMINOPHEN 500 MG PO TABS
1000.0000 mg | ORAL_TABLET | Freq: Once | ORAL | Status: AC
  Administered 2022-10-24: 1000 mg via ORAL
  Filled 2022-10-24: qty 2

## 2022-10-24 SURGICAL SUPPLY — 54 items
ADH SKN CLS APL DERMABOND .7 (GAUZE/BANDAGES/DRESSINGS) ×1
APL PRP STRL LF DISP 70% ISPRP (MISCELLANEOUS) ×1
APPLIER CLIP ROT 10 11.4 M/L (STAPLE) ×1
APR CLP MED LRG 11.4X10 (STAPLE) ×1
BAG COUNTER SPONGE SURGICOUNT (BAG) ×1 IMPLANT
BAG SPEC RTRVL 10 TROC 200 (ENDOMECHANICALS) ×1
BAG SPNG CNTER NS LX DISP (BAG) ×1
CANISTER SUCT 3000ML PPV (MISCELLANEOUS) ×1 IMPLANT
CHLORAPREP W/TINT 26 (MISCELLANEOUS) ×1 IMPLANT
CLIP APPLIE ROT 10 11.4 M/L (STAPLE) ×1 IMPLANT
COVER MAYO STAND STRL (DRAPES) IMPLANT
COVER SURGICAL LIGHT HANDLE (MISCELLANEOUS) ×1 IMPLANT
DERMABOND ADVANCED .7 DNX12 (GAUZE/BANDAGES/DRESSINGS) ×1 IMPLANT
DRAPE C-ARM 42X120 X-RAY (DRAPES) ×1 IMPLANT
ELECT REM PT RETURN 9FT ADLT (ELECTROSURGICAL) ×1
ELECTRODE REM PT RTRN 9FT ADLT (ELECTROSURGICAL) ×1 IMPLANT
ENDOLOOP SUT PDS II 0 18 (SUTURE) IMPLANT
GLOVE BIO SURGEON STRL SZ7 (GLOVE) ×1 IMPLANT
GOWN STRL REUS W/ TWL LRG LVL3 (GOWN DISPOSABLE) ×2 IMPLANT
GOWN STRL REUS W/ TWL XL LVL3 (GOWN DISPOSABLE) ×1 IMPLANT
GOWN STRL REUS W/TWL LRG LVL3 (GOWN DISPOSABLE) ×2
GOWN STRL REUS W/TWL XL LVL3 (GOWN DISPOSABLE) ×1
GRASPER SUT TROCAR 14GX15 (MISCELLANEOUS) ×1 IMPLANT
IRRIG SUCT STRYKERFLOW 2 WTIP (MISCELLANEOUS) ×1
IRRIGATION SUCT STRKRFLW 2 WTP (MISCELLANEOUS) ×1 IMPLANT
KIT BASIN OR (CUSTOM PROCEDURE TRAY) ×1 IMPLANT
KIT IMAGING PINPOINTPAQ (MISCELLANEOUS) IMPLANT
KIT TURNOVER KIT B (KITS) ×1 IMPLANT
L-HOOK LAP DISP 36CM (ELECTROSURGICAL) ×1
LHOOK LAP DISP 36CM (ELECTROSURGICAL) ×1 IMPLANT
NDL 22X1.5 STRL (OR ONLY) (MISCELLANEOUS) ×1 IMPLANT
NDL INSUFFLATION 14GA 120MM (NEEDLE) ×1 IMPLANT
NEEDLE 22X1.5 STRL (OR ONLY) (MISCELLANEOUS) ×1 IMPLANT
NEEDLE INSUFFLATION 14GA 120MM (NEEDLE) ×1 IMPLANT
NS IRRIG 1000ML POUR BTL (IV SOLUTION) ×1 IMPLANT
PAD ARMBOARD 7.5X6 YLW CONV (MISCELLANEOUS) ×1 IMPLANT
PENCIL BUTTON HOLSTER BLD 10FT (ELECTRODE) ×1 IMPLANT
POUCH RETRIEVAL ECOSAC 10 (ENDOMECHANICALS) ×1 IMPLANT
SCISSORS LAP 5X35 DISP (ENDOMECHANICALS) ×1 IMPLANT
SET CHOLANGIOGRAPH 5 50 .035 (SET/KITS/TRAYS/PACK) IMPLANT
SET TUBE SMOKE EVAC HIGH FLOW (TUBING) ×1 IMPLANT
SLEEVE Z-THREAD 5X100MM (TROCAR) ×2 IMPLANT
SPECIMEN JAR SMALL (MISCELLANEOUS) ×1 IMPLANT
STOPCOCK 4 WAY LG BORE MALE ST (IV SETS) ×1 IMPLANT
SUT MNCRL AB 4-0 PS2 18 (SUTURE) ×1 IMPLANT
SYS BAG RETRIEVAL 10MM (BASKET)
SYSTEM BAG RETRIEVAL 10MM (BASKET) IMPLANT
TOWEL GREEN STERILE (TOWEL DISPOSABLE) ×1 IMPLANT
TOWEL GREEN STERILE FF (TOWEL DISPOSABLE) ×1 IMPLANT
TRAY LAPAROSCOPIC MC (CUSTOM PROCEDURE TRAY) ×1 IMPLANT
TROCAR Z THREAD OPTICAL 12X100 (TROCAR) ×1 IMPLANT
TROCAR Z-THREAD OPTICAL 5X100M (TROCAR) ×1 IMPLANT
WARMER LAPAROSCOPE (MISCELLANEOUS) ×1 IMPLANT
WATER STERILE IRR 1000ML POUR (IV SOLUTION) ×1 IMPLANT

## 2022-10-24 NOTE — Assessment & Plan Note (Addendum)
AKI, Hyperkalemia.  With supportive medical therapy, including IV fluids renal function has improved.  At he time of her discharge her serum cr is 2,77 with K at 4,6 and serum bicarbonate at 21. Na 137.   Plan to resume bumetanide, continue hooding on entresto. Follow up renal function as outpatient.   Hold on IV fluid. Add one dose of sodium zirconium for hyperkalemia and follow up renal function and electrolytes in am.

## 2022-10-24 NOTE — Assessment & Plan Note (Addendum)
Echocardiogram with preserved LV systolic function 70 to 75%, moderate LVH, RV mild enlargement, RVSP 54.0 mmHg. La with mild to moderate dilatation, RA with mild dilatation.   Patient required IV fluids, with improvement in his hemodynamics.   Plan to continue with metoprolol and after load reduction with hydralazine and isosorobide.  Considering hyperkalemia, and unstable GFR, holding for now entresto.  At discharge will resume bumetanide and SGLT 2 inh.

## 2022-10-24 NOTE — Progress Notes (Signed)
[facility] Pre-op Review 1.Surgical orders: Consent orders: Completed Consent signed: Completed by floor RN Antibiotic:  Rocephin 2G 0908 Pre-op Medications Ordered:   IC-Green ordered to be given in Short Stay    2.  Pre-procedure checklist completed: Completed by floor RN  3.  NPO:  Since 1930 10/23/22  4.  CHG Bath completed:  Completed by floor RN      Belongings removed and placed in clean gown:  5.  Labs Performed: CBC    Abnormal 10/24/22 CMP/BMP   Abnormal 10/24/22 PT/INR  NA HA1C   Abnormal 10/21/22 Type and Screen NA Surgical PCR:  NA  Pregnancy:  NA EKG:    10/21/22 Chest x-ray:  10/21/22  6.  Recent H&P or progress note if inpatient:  10/24/22  7.  Language Barrier:  None  8.  Vital Signs:  Stable Oxygen:  Room Air Tele:   NA Can the patient travel without Tele  9.  Medications: Cardiac Drips:  None Pain Medications: Dilaudid 0.5mg  2358 Beta Blocker:   NA Anticoagulants:  Heaparin 5000 units 0639 GLP1:   NA  10. IV access:  18G  Left AC  11. Diabetic:     CBG:  157   @   0805   1-UNIT Aspart given 0903  Perioperative Glycemic Control Scale:    Sensitive

## 2022-10-24 NOTE — Care Management Important Message (Signed)
Important Message  Patient Details  Name: ANEESA ROMEY MRN: [id number] Date of Birth: 01/01/1941   Important Message Given:  Yes - Medicare IM     Sherilyn Banker 10/24/2022, 4:37 PM

## 2022-10-24 NOTE — Anesthesia Procedure Notes (Signed)
Procedure Name: Intubation Date/Time: 10/24/2022 1:19 PM  Performed by: Orlin Hilding, CRNAPre-anesthesia Checklist: Patient identified, Emergency Drugs available, Suction available, Patient being monitored and Timeout performed Patient Re-evaluated:Patient Re-evaluated prior to induction Oxygen Delivery Method: Circle system utilized Preoxygenation: Pre-oxygenation with 100% oxygen Induction Type: IV induction Ventilation: Mask ventilation without difficulty and Oral airway inserted - appropriate to patient size Laryngoscope Size: Mac and 4 Grade View: Grade I Tube type: Oral Tube size: 7.0 mm Number of attempts: 1 Placement Confirmation: ETT inserted through vocal cords under direct vision, positive ETCO2 and breath sounds checked- equal and bilateral Secured at: 21 cm Tube secured with: Tape Dental Injury: Teeth and Oropharynx as per pre-operative assessment

## 2022-10-24 NOTE — Progress Notes (Signed)
Cardiologist:  Tolia/Lambert  Subjective:   More pain when trying to eat last night   Objective:  Vitals:   10/23/22 1650 10/23/22 1940 10/24/22 0504 10/24/22 0744  BP: (!) 115/49 (!) 128/50 137/65 133/71  Pulse: 85 87 92 89  Resp: 17 20 20 16   Temp: 98 F (36.7 C) 98.7 F (37.1 C) 98 F (36.7 C) 98.2 F (36.8 C)  TempSrc: Oral Oral Oral Oral  SpO2: 91% 93% 93% 93%  Weight:      Height:        Intake/Output from previous day:  Intake/Output Summary (Last 24 hours) at 10/24/2022 0905 Last data filed at 10/23/2022 1700 Gross per 24 hour  Intake 720 ml  Output --  Net 720 ml    Physical Exam:  Elderly female Lungs clear No murmur PPM under left clavicle RUQ pain to palpation no rebound Trace edema  Lab Results: Basic Metabolic Panel: Recent Labs    10/23/22 0632 10/24/22 0659  NA 135 135  K 4.9 4.6  CL 102 102  CO2 23 21*  GLUCOSE 120* 167*  BUN 48* 53*  CREATININE 3.47* 3.37*  CALCIUM 8.7* 8.3*   Liver Function Tests: Recent Labs    10/23/22 0632 10/24/22 0659  AST 303* 150*  ALT 478* 290*  ALKPHOS 382* 429*  BILITOT 2.2* 1.0  PROT 6.1* 5.9*  ALBUMIN 2.3* 2.2*   No results for input(s): "LIPASE", "AMYLASE" in the last 72 hours.  CBC: Recent Labs    10/23/22 0632 10/24/22 0659  WBC 13.2* 9.7  HGB 11.1* 10.5*  HCT 34.8* 32.8*  MCV 101.2* 99.7  PLT 234 228   Cardiac Enzymes: No results for input(s): "CKTOTAL", "CKMB", "CKMBINDEX", "TROPONINI" in the last 72 hours. BNP: Invalid input(s): "POCBNP" D-Dimer: No results for input(s): "DDIMER" in the last 72 hours. Hemoglobin A1C: Recent Labs    10/21/22 1436  HGBA1C 7.6*    Imaging: MR 3D Recon At Scanner  Result Date: 10/23/2022 CLINICAL DATA:  Right upper quadrant abdominal pain with nausea and vomiting. Gallstone on CT, biliary dilatation on ultrasound and no biliary excretion on nuclear medicine hepatobiliary scan. EXAM: MRI ABDOMEN WITHOUT CONTRAST  (INCLUDING MRCP)  TECHNIQUE: Multiplanar multisequence MR imaging of the abdomen was performed. Heavily T2-weighted images of the biliary and pancreatic ducts were obtained, and three-dimensional MRCP images were rendered by post processing. COMPARISON:  Abdominal CT and ultrasound 10/21/2022. Nuclear medicine hepatobiliary scan 10/22/2022. FINDINGS: Lower chest: Cardiomegaly and bibasilar atelectasis noted. No significant pleural or pericardial effusion. Patient has a pacemaker. Hepatobiliary: The liver has a non cirrhotic morphology. No significant steatosis. No focal lesions are identified on noncontrast imaging. There is mild periportal edema. Small gallstone and dependent sludge noted in the gallbladder lumen. Compared with the recent CT, there is increased gallbladder wall thickening and pericholecystic fluid, suspicious for developing acute cholecystitis. No significant intrahepatic biliary dilatation. The extrahepatic biliary system remains dilated with the common hepatic duct measuring up to 11 mm in diameter. The common bile duct tapers distally. No evidence of choledocholithiasis. Pancreas: No pancreatic ductal dilatation or evidence of acute pancreatitis. There are several cystic pancreatic lesions, largest measuring 1.7 cm in the pancreatic body on image 23/3. There is a smaller cyst more distally in the pancreatic tail measuring 0.8 cm on image 23/3. These demonstrate no aggressive characteristics on noncontrast imaging. Spleen: Normal in size without focal abnormality. Adrenals/Urinary Tract: Both adrenal glands appear normal. No evidence of renal mass or hydronephrosis. There is a small  cyst in the lower pole of the right kidney for which no specific follow-up imaging is recommended. Stomach/Bowel: The stomach appears unremarkable for its degree of distension. No evidence of bowel wall thickening, distention or surrounding inflammatory change. Vascular/Lymphatic: There are no enlarged abdominal lymph nodes. Aortic  atherosclerosis, better seen on CT. Other: No evidence of abdominal wall hernia or ascites. Musculoskeletal: No acute or significant osseous findings. Mild dependent subcutaneous edema in the back without focal fluid collection. Unless specific follow-up recommendations are mentioned in the findings or impression sections, no imaging follow-up of any mentioned incidental findings is recommended. IMPRESSION: 1. Cholelithiasis with increased gallbladder wall thickening and pericholecystic fluid, suspicious for developing acute cholecystitis. Correlate clinically. 2. Mild extrahepatic biliary dilatation without evidence of choledocholithiasis. 3. Several small cystic lesions in the pancreas, largest measuring 1.7 cm in the pancreatic body. No aggressive characteristics on noncontrast imaging. These may be postinflammatory or reflect side branch IPMN's. Recommend follow up pre and post-contrast MRI or pancreas protocol CT in 2 years. 4. Cardiomegaly and bibasilar atelectasis. Electronically Signed   By: Carey Bullocks M.D.   On: 10/23/2022 16:56   MR ABDOMEN MRCP WO CONTRAST  Result Date: 10/23/2022 CLINICAL DATA:  Right upper quadrant abdominal pain with nausea and vomiting. Gallstone on CT, biliary dilatation on ultrasound and no biliary excretion on nuclear medicine hepatobiliary scan. EXAM: MRI ABDOMEN WITHOUT CONTRAST  (INCLUDING MRCP) TECHNIQUE: Multiplanar multisequence MR imaging of the abdomen was performed. Heavily T2-weighted images of the biliary and pancreatic ducts were obtained, and three-dimensional MRCP images were rendered by post processing. COMPARISON:  Abdominal CT and ultrasound 10/21/2022. Nuclear medicine hepatobiliary scan 10/22/2022. FINDINGS: Lower chest: Cardiomegaly and bibasilar atelectasis noted. No significant pleural or pericardial effusion. Patient has a pacemaker. Hepatobiliary: The liver has a non cirrhotic morphology. No significant steatosis. No focal lesions are identified on  noncontrast imaging. There is mild periportal edema. Small gallstone and dependent sludge noted in the gallbladder lumen. Compared with the recent CT, there is increased gallbladder wall thickening and pericholecystic fluid, suspicious for developing acute cholecystitis. No significant intrahepatic biliary dilatation. The extrahepatic biliary system remains dilated with the common hepatic duct measuring up to 11 mm in diameter. The common bile duct tapers distally. No evidence of choledocholithiasis. Pancreas: No pancreatic ductal dilatation or evidence of acute pancreatitis. There are several cystic pancreatic lesions, largest measuring 1.7 cm in the pancreatic body on image 23/3. There is a smaller cyst more distally in the pancreatic tail measuring 0.8 cm on image 23/3. These demonstrate no aggressive characteristics on noncontrast imaging. Spleen: Normal in size without focal abnormality. Adrenals/Urinary Tract: Both adrenal glands appear normal. No evidence of renal mass or hydronephrosis. There is a small cyst in the lower pole of the right kidney for which no specific follow-up imaging is recommended. Stomach/Bowel: The stomach appears unremarkable for its degree of distension. No evidence of bowel wall thickening, distention or surrounding inflammatory change. Vascular/Lymphatic: There are no enlarged abdominal lymph nodes. Aortic atherosclerosis, better seen on CT. Other: No evidence of abdominal wall hernia or ascites. Musculoskeletal: No acute or significant osseous findings. Mild dependent subcutaneous edema in the back without focal fluid collection. Unless specific follow-up recommendations are mentioned in the findings or impression sections, no imaging follow-up of any mentioned incidental findings is recommended. IMPRESSION: 1. Cholelithiasis with increased gallbladder wall thickening and pericholecystic fluid, suspicious for developing acute cholecystitis. Correlate clinically. 2. Mild extrahepatic  biliary dilatation without evidence of choledocholithiasis. 3. Several small cystic lesions in the  pancreas, largest measuring 1.7 cm in the pancreatic body. No aggressive characteristics on noncontrast imaging. These may be postinflammatory or reflect side branch IPMN's. Recommend follow up pre and post-contrast MRI or pancreas protocol CT in 2 years. 4. Cardiomegaly and bibasilar atelectasis. Electronically Signed   By: Carey Bullocks M.D.   On: 10/23/2022 16:56   ECHOCARDIOGRAM COMPLETE  Result Date: 10/22/2022    ECHOCARDIOGRAM REPORT   Patient Name:   ALZENIA BERSON Date of Exam: 10/22/2022 Medical Rec #:  [id number]   Height:       61.0 in Accession #:    [id number]  Weight:       198.0 lb Date of Birth:  1940-10-31   BSA:          1.881 m Patient Age:    82 years    BP:           134/55 mmHg Patient Gender: F           HR:           89 bpm. Exam Location:  Inpatient Procedure: 2D Echo, Color Doppler and Cardiac Doppler Indications:    acute diastolic chf  History:        Patient has prior history of Echocardiogram examinations, most                 recent 10/19/2021. Pacemaker, chronic kidney disease,                 Arrythmias:LBBB; Risk Factors:Diabetes, Hypertension and                 Dyslipidemia.  Sonographer:    Delcie Roch RDCS Referring Phys: [id number] RONDELL A SMITH  Sonographer Comments: Technically difficult study due to poor echo windows. Image acquisition challenging due to patient body habitus and Image acquisition challenging due to respiratory motion. IMPRESSIONS  1. Left ventricular ejection fraction, by estimation, is 70 to 75%. The left ventricle has hyperdynamic function. The left ventricle has no regional wall motion abnormalities. There is moderate concentric left ventricular hypertrophy. Left ventricular diastolic parameters are indeterminate.  2. Right ventricular systolic function is normal. The right ventricular size is mildly enlarged. There is moderately elevated pulmonary  artery systolic pressure. The estimated right ventricular systolic pressure is 54.0 mmHg.  3. Left atrial size was mild to moderately dilated.  4. Right atrial size was mildly dilated.  5. The mitral valve is degenerative. No evidence of mitral valve regurgitation. No evidence of mitral stenosis. Moderate mitral annular calcification.  6. The aortic valve is tricuspid. There is mild calcification of the aortic valve. Aortic valve regurgitation is not visualized. Aortic valve sclerosis/calcification is present, without any evidence of aortic stenosis. Aortic valve area, by VTI measures  2.80 cm. Aortic valve mean gradient measures 8.0 mmHg. Aortic valve Vmax measures 2.11 m/s.  7. The inferior vena cava is normal in size with greater than 50% respiratory variability, suggesting right atrial pressure of 3 mmHg. FINDINGS  Left Ventricle: Left ventricular ejection fraction, by estimation, is 70 to 75%. The left ventricle has hyperdynamic function. The left ventricle has no regional wall motion abnormalities. Definity contrast agent was given IV to delineate the left ventricular endocardial borders. The left ventricular internal cavity size was normal in size. There is moderate concentric left ventricular hypertrophy. Left ventricular diastolic parameters are indeterminate. Right Ventricle: The right ventricular size is mildly enlarged. No increase in right ventricular wall thickness. Right ventricular systolic function is normal. There is  moderately elevated pulmonary artery systolic pressure. The tricuspid regurgitant velocity is 3.57 m/s, and with an assumed right atrial pressure of 3 mmHg, the estimated right ventricular systolic pressure is 54.0 mmHg. Left Atrium: Left atrial size was mild to moderately dilated. Right Atrium: Right atrial size was mildly dilated. Pericardium: There is no evidence of pericardial effusion. Mitral Valve: The mitral valve is degenerative in appearance. Moderate mitral annular  calcification. No evidence of mitral valve regurgitation. No evidence of mitral valve stenosis. MV peak gradient, 10.5 mmHg. The mean mitral valve gradient is 4.0 mmHg. Tricuspid Valve: The tricuspid valve is normal in structure. Tricuspid valve regurgitation is trivial. No evidence of tricuspid stenosis. Aortic Valve: The aortic valve is tricuspid. There is mild calcification of the aortic valve. Aortic valve regurgitation is not visualized. Aortic valve sclerosis/calcification is present, without any evidence of aortic stenosis. Aortic valve mean gradient measures 8.0 mmHg. Aortic valve peak gradient measures 17.8 mmHg. Aortic valve area, by VTI measures 2.80 cm. Pulmonic Valve: The pulmonic valve was normal in structure. Pulmonic valve regurgitation is not visualized. No evidence of pulmonic stenosis. Aorta: The aortic root is normal in size and structure. Venous: The inferior vena cava is normal in size with greater than 50% respiratory variability, suggesting right atrial pressure of 3 mmHg. IAS/Shunts: No atrial level shunt detected by color flow Doppler.  LEFT VENTRICLE PLAX 2D LV IVS:        1.50 cm   Diastology LVOT diam:     2.10 cm   LV e' medial:  8.27 cm/s LV SV:         92        LV e' lateral: 4.68 cm/s LV SV Index:   49 LVOT Area:     3.46 cm  RIGHT VENTRICLE             IVC RV S prime:     10.60 cm/s  IVC diam: 1.50 cm TAPSE (M-mode): 1.1 cm LEFT ATRIUM             Index        RIGHT ATRIUM           Index LA Vol (A2C):   62.1 ml 33.02 ml/m  RA Area:     15.30 cm LA Vol (A4C):   65.3 ml 34.72 ml/m  RA Volume:   38.50 ml  20.47 ml/m LA Biplane Vol: 69.3 ml 36.85 ml/m  AORTIC VALVE AV Area (Vmax):    2.76 cm AV Area (Vmean):   2.91 cm AV Area (VTI):     2.80 cm AV Vmax:           211.00 cm/s AV Vmean:          125.000 cm/s AV VTI:            0.330 m AV Peak Grad:      17.8 mmHg AV Mean Grad:      8.0 mmHg LVOT Vmax:         168.00 cm/s LVOT Vmean:        105.000 cm/s LVOT VTI:          0.267  m LVOT/AV VTI ratio: 0.81  AORTA Ao Root diam: 2.90 cm Ao Asc diam:  2.80 cm MITRAL VALVE             TRICUSPID VALVE MV Area VTI:  3.16 cm   TR Peak grad:   51.0 mmHg MV Peak grad: 10.5 mmHg  TR Vmax:  357.00 cm/s MV Mean grad: 4.0 mmHg MV Vmax:      1.62 m/s   SHUNTS MV Vmean:     90.3 cm/s  Systemic VTI:  0.27 m                          Systemic Diam: 2.10 cm Arvilla Meres MD Electronically signed by Arvilla Meres MD Signature Date/Time: 10/22/2022/5:54:53 PM    Final    NM Hepatobiliary Liver Func  Result Date: 10/22/2022 CLINICAL DATA:  Elevated bilirubin. Elevated liver enzymes. RIGHT upper quadrant pain nausea and vomiting. Indeterminate ultrasound of the RIGHT upper quadrant. EXAM: NUCLEAR MEDICINE HEPATOBILIARY IMAGING TECHNIQUE: Sequential images of the abdomen were obtained out to 60 minutes following intravenous administration of radiopharmaceutical. RADIOPHARMACEUTICALS:  7.5 mCi Tc-83m  Choletec IV COMPARISON:  CT and ultrasound 10/21/2022 FINDINGS: Prompt clearance radiotracer from blood pool and homogeneous uptake in liver. No evidence of biliary excretion is evident within the common bile duct over the 2 hour interval. As expected with no activity in the common bile duct, the gallbladder fails to fill. IMPRESSION: No excretion of radiotracer from the liver. Differential would include biliary obstruction versus hepatic dysfunction such as hepatitis. Favor hepatic dysfunction. Electronically Signed   By: Genevive Bi M.D.   On: 10/22/2022 15:26    Cardiac Studies:  ECG: A sense V pace rate 76    Telemetry:  Echo: 10/13 EF 70-75% moderate LVH indeterminate diastolic parameters   Medications:    allopurinol  300 mg Oral Daily   atorvastatin  40 mg Oral Daily   empagliflozin  10 mg Oral QAC breakfast   guaiFENesin  600 mg Oral BID   heparin  5,000 Units Subcutaneous Q8H   insulin aspart  0-6 Units Subcutaneous TID WC      sodium chloride 75 mL/hr at 10/23/22 1951    cefTRIAXone (ROCEPHIN)  IV 2 g (10/23/22 1012)   metronidazole 500 mg (10/23/22 1952)    Assessment/Plan:   PPM:  MRI compatible Assurity device  DDD back up rate 60 Does not appear PPM dependant with only 9% V pacing  No issues with MRERCP yesterday  Diastolic dysfunction:  no volume overload  Cholycystitis:  HIDA positive on Rocephin per general surgery and GI ? Surgery indicated with ongoing pain with food MRERCP with GB thickening and pericolic fluid ? Acute cholecystitis.  HLD:  on statin   Charlton Haws 10/24/2022, 9:05 AM

## 2022-10-24 NOTE — Assessment & Plan Note (Addendum)
Patient was placed on insulin sliding scale for glucose cover and monitoring.  Her glucose remained stable.   Continue with statin therapy.

## 2022-10-24 NOTE — Assessment & Plan Note (Addendum)
Cholangitis (complicated with sepsis present on admission). Sepsis now resolved.   10/15 ERCP with cholelithiasis with increased gallbladder wall thickening and pericholecystic fluid, suspicious for developing acute cholecystitis.  Mild extrahepatic bilary dilatation without evidence of choledocholithiasis.  Several small cystic lesions in the pancreas, largest measuring 1,7 cm in the pancreatic body. (Follow up in 2 years).   10.16 cholecystectomy (laparoscopic with intra operative cholangiography).    Post operative antibiotic therapy was discontinued.  No further abdominal pan, wbc is 8,9.  Patient will continue pain control with acetaminophen and will follow up as outpatient.  Resume bowel regimen.

## 2022-10-24 NOTE — Anesthesia Preprocedure Evaluation (Addendum)
Anesthesia Evaluation  Patient identified by MRN, date of birth, ID band Patient awake    Reviewed: Allergy & Precautions, NPO status , Patient's Chart, lab work & pertinent test results  History of Anesthesia Complications Negative for: history of anesthetic complications  Airway Mallampati: II  TM Distance: >3 FB Neck ROM: Full    Dental  (+) Partial Upper, Loose,    Pulmonary neg pulmonary ROS   Pulmonary exam normal        Cardiovascular hypertension, + CAD and +CHF  Normal cardiovascular exam+ dysrhythmias + pacemaker   TTE 10/22/22: EF 70-75%, moderate LVH, moderate pHTN (RVSP ), mild to moderate LAE, mild RAE    Neuro/Psych    GI/Hepatic negative GI ROS,,,Transaminitis   Endo/Other  diabetes, Type 2, Oral Hypoglycemic Agents    Renal/GU Renal Insufficiency and ARFRenal disease (Cr 3.37)     Musculoskeletal   Abdominal   Peds  Hematology  (+) Blood dyscrasia (Hgb 10.5), anemia   Anesthesia Other Findings Day of surgery medications reviewed with patient.  Reproductive/Obstetrics                             Anesthesia Physical Anesthesia Plan  ASA: 2  Anesthesia Plan: General   Post-op Pain Management: Tylenol PO (pre-op)*   Induction: Intravenous  PONV Risk Score and Plan: 3 and Treatment may vary due to age or medical condition, Ondansetron and Dexamethasone  Airway Management Planned: Oral ETT  Additional Equipment: None  Intra-op Plan:   Post-operative Plan: Extubation in OR  Informed Consent: I have reviewed the patients History and Physical, chart, labs and discussed the procedure including the risks, benefits and alternatives for the proposed anesthesia with the patient or authorized representative who has indicated his/her understanding and acceptance.     Dental advisory given  Plan Discussed with: CRNA  Anesthesia Plan Comments:          Anesthesia Quick Evaluation

## 2022-10-24 NOTE — Op Note (Signed)
Patient: Connie Swanson MRN: [id number] DOB: Oct 25, 1940 Sex: female Operation/Procedure Date: 10/24/2022  Surgeons and Role:    * Hillery Hunter, [name], MD - Primary  Assisting: Trixie Deis, PA      Pre-operative Diagnoses: Acute Cholecystitis Postoperative Diagnoses: Same  Procedure performed: Laparoscopic cholecystectomy with intra-operative cholangiography using ICG  Anesthesia: General endotracheal anesthesia  Indications: Connie Swanson is a 82 year old female who presented to the hospital with abdominal pain. Her labs and exam were concerning for acute cholecystitis.  She had an elevation in bilirubin and MRCP was ordered but did not show signs of choledocholithiasis.  She was evaluated by GI but no procedure we indicated and her bilirubin ultimately down trended.  She was also evaluated by cardiology given her cardiac history and was felt to be optimized for surgery.  Preoperatively, I discussed in detail the risks, benefits, alternatives, and potential complications. The patient understands and requests to proceed.  Operative Findings: Significant inflammation of the gallbladder. An IOC was unable to be performed, however, using ICG and fluorescent imaging I was able to see radiotracer in the common bile duct and duodenum, suggesting no biliary obstruction.   Operative Narrative: The patient was administered indocyanine green in pre-op.  The patient was positively identified and was taken to the operating room and placed supine on the operating table. A time-out was performed confirming correct patient and procedure. We also confirmed initiation of deep venous thrombosis prophylaxis and wound prophylaxis. After successful induction of general endotracheal anesthesia, the arms were carefully padded. An orogastric tube and footboard were placed. The abdomen was prepped and draped in the usual sterile surgical fashion.  We began out access with a Veress needle in the LUQ at [place].   Following aspiration of air and a positive saline drop test, the insufflation tubing was connected and the abdomen brought to a pressure of . A 5-mm port was placed superior to the umbilicus using an opti-view technique. The abdomen was inspected and there were no signs of injury from access.  We placed three additional ports all under direct vision, a 11-mm port in the subxiphoid position, one 5-mm port in the right midclavicular line, and one right 5-mm subcostal port in the anterior axillary line. The patient was placed in the head up position and tilted slightly to the left. The dome of the gallbladder was grasped, elevated, and retracted anteriorly and cephalad. The infundibulum was retracted laterally.  There were significant adhesions between the omentum and fundus of the gallbladder.  These were dissected carefully using a combination of electrocautery and blunt dissection. The investing visceral peritoneal attachments overlying the infundibulum of the gallbladder were dissected free from the gallbladder itself. We soon developed two structures into the gallbladder consistent with the cystic duct and cystic artery. The loose areolar tissue around these structures was dissected free. The camera was switched to a fluorescence viewing mode.  We visualized radiotracer in the liver as well within what appeared to be the common bile duct and duodenum.  There were no signs of obstruction. We switched back to a regular viewing mode. I did attempt an IOC using the cook catheter, however, after creating a ductotomy I attempted to thread the catheter but it would not advance more than a few millimeters into the duct.  I did not want to risk damaging the cystic duct or creating a stump that was too small to clip, so I decided to proceed with the remainder of the operation.  The  cystic duct and cystic artery were triply clipped. These were divided using laparoscopic scissors leaving a single clip on the removal  side. The gallbladder was elevated off the gallbladder fossa using the hook Bovie. The gallbladder was then exteriorized through the subxiphoid position using a specimen bag. We reestablished pneumoperitoneum and confirmed no leakage of blood or bile.  Examination of the clips on the cystic duct showed that they may not have crossed the duct entirely. A 2-0 PDS endo-loop was brought into the abdomen and secured around the cystic duct.  The previously placed clips were removed and 2 new clips were placed, which appeared to completely occlude the duct.  There was no evidence of bile leak. The subhepatic space was irrigated with warm sterile saline and suctioned free. The subxiphoid port was removed and the defect closed with an interrupted 0 vicryl on a suture passer.  The other ports were removed under direct vision and the abdomen was desufflated. We placed 0.25% Marcaine plain at each incision site for local anesthesia. The skin was closed using 4-0 Monocryl subcuticular suture. Dermabond was applied. The patient tolerated the procedure well, was extubated, and taken to the recovery room.  Estimated Blood Loss: 50mL Specimens: Gallbladder Implants: None Drains: None Complications: None Condition of the patient: Good, extubated Disposition: PACU   [name] Date: 10/24/2022 Time: 3:15 PM

## 2022-10-24 NOTE — Hospital Course (Addendum)
Connie Swanson was admitted to the hospital with the working diagnosis of acute cholangitis with cholelithiasis.   82/F with history of chronic diastolic CHF, CAD, complete heart block sp pacemake, type 2 diabetes mellitus, hypertension, dyslipidemia who presented with abdominal pain, right upper quadrant. It occurred about 4 hrs after eating dinner. Because pain was persistent and intense she came to the ED for further evaluation. On her initial physical examination her blood pressure was 183/81, HR 63, RR 19 and 02 saturation 100%, lungs with no wheezing or rales, heart with S1 and S2 present and regular with no gallops, or rubs, abdomen not distended and not tender to palpation, no lower extremity edema.   Na 138, K 4,2 CL 107 bicarbonate 22, glucose 228 bun 25 cr 1,9 BNP 399  High sensitive troponin 11  Lactic acid 2,3  Wbc 13.3 hgb 11.4 plt 276  Urine analysis SG 1,008, protein 100, glucose >500, negative for Hgb and negative for leukocytes.   Chest radiograph with left rotation, hyperinflation, increased lung markings bilaterally with no frank infiltrate, no effusions, pacemaker in place with one lead in the right atrium and one lead in the right ventricle.   EKG 76 bpm, left axis deviation, left bundle branch block, qtc 522, sinus with 1st degree AV block, ventricular paced rhythm with no significant ST segment   10/14 a.m. worsening leukocytosis, kidney function, significantly elevated LFTs and bilirubin. HIDA scan concerning for biliary obstruction  10/15, worsening kidney function, LFTs remain elevated. MRCP with cholelithiasis with increased gallbladder wall thickening and pericholecystic fluid. No choledocholithiasis. Extra hepatic biliary system with dilatation with the common hepatic duct measuring up to 11 mm in diameter.   10/16 Laparoscopic cholecystectomy with intra operative cholangiography.   10/17 patient recovering well post operative, renal function more stable.  Plan to  follow up as outpatient.

## 2022-10-24 NOTE — Transfer of Care (Signed)
Immediate Anesthesia Transfer of Care Note  Patient: Connie Swanson  Procedure(s) Performed: LAPAROSCOPIC CHOLECYSTECTOMY (Abdomen)  Patient Location: PACU  Anesthesia Type:General  Level of Consciousness: drowsy and patient cooperative  Airway & Oxygen Therapy: Patient Spontanous Breathing and Patient connected to face mask oxygen  Post-op Assessment: Report given to RN and Post -op Vital signs reviewed and stable  Post vital signs: Reviewed and stable  Last Vitals:  Vitals Value Taken Time  BP 151/59 10/24/22 1538  Temp 36.4 C 10/24/22 1538  Pulse 88 10/24/22 1541  Resp 22 10/24/22 1541  SpO2 95 % 10/24/22 1541  Vitals shown include unfiled device data.  Last Pain:  Vitals:   10/24/22 1057  TempSrc:   PainSc: 0-No pain      Patients Stated Pain Goal: 0 (10/23/22 1104)  Complications: No notable events documented.

## 2022-10-24 NOTE — Assessment & Plan Note (Addendum)
Blood pressure has been more stable.  At the time of her discharge continue hydralazine, isosorbide, and metoprolol. Holding entresto due to hyperkalemia.

## 2022-10-24 NOTE — Assessment & Plan Note (Signed)
Calculated BMI is 65,2 consistent with obesity class 3.

## 2022-10-24 NOTE — Plan of Care (Signed)
  Problem: Education: Goal: Knowledge of cardiac device and self-care will improve Outcome: Progressing Goal: Ability to safely manage health related needs after discharge will improve Outcome: Progressing Goal: Individualized Educational Video(s) Outcome: Progressing   Problem: Cardiac: Goal: Ability to achieve and maintain adequate cardiopulmonary perfusion will improve Outcome: Progressing   Problem: Education: Goal: Ability to describe self-care measures that may prevent or decrease complications (Diabetes Survival Skills Education) will improve Outcome: Progressing Goal: Individualized Educational Video(s) Outcome: Progressing   Problem: Coping: Goal: Ability to adjust to condition or change in health will improve Outcome: Progressing   Problem: Fluid Volume: Goal: Ability to maintain a balanced intake and output will improve Outcome: Progressing   Problem: Health Behavior/Discharge Planning: Goal: Ability to identify and utilize available resources and services will improve Outcome: Progressing Goal: Ability to manage health-related needs will improve Outcome: Progressing   Problem: Metabolic: Goal: Ability to maintain appropriate glucose levels will improve Outcome: Progressing   Problem: Nutritional: Goal: Maintenance of adequate nutrition will improve Outcome: Progressing Goal: Progress toward achieving an optimal weight will improve Outcome: Progressing   Problem: Skin Integrity: Goal: Risk for impaired skin integrity will decrease Outcome: Progressing   Problem: Tissue Perfusion: Goal: Adequacy of tissue perfusion will improve Outcome: Progressing   Problem: Education: Goal: Knowledge of General Education information will improve Description: Including pain rating scale, medication(s)/side effects and non-pharmacologic comfort measures Outcome: Progressing   Problem: Health Behavior/Discharge Planning: Goal: Ability to manage health-related needs  will improve Outcome: Progressing   Problem: Clinical Measurements: Goal: Ability to maintain clinical measurements within normal limits will improve Outcome: Progressing Goal: Will remain free from infection Outcome: Progressing Goal: Diagnostic test results will improve Outcome: Progressing Goal: Respiratory complications will improve Outcome: Progressing Goal: Cardiovascular complication will be avoided Outcome: Progressing   Problem: Activity: Goal: Risk for activity intolerance will decrease Outcome: Progressing   Problem: Nutrition: Goal: Adequate nutrition will be maintained Outcome: Progressing   Problem: Coping: Goal: Level of anxiety will decrease Outcome: Progressing   Problem: Elimination: Goal: Will not experience complications related to bowel motility Outcome: Progressing Goal: Will not experience complications related to urinary retention Outcome: Progressing   Problem: Pain Managment: Goal: General experience of comfort will improve Outcome: Progressing   Problem: Safety: Goal: Ability to remain free from injury will improve Outcome: Progressing   Problem: Skin Integrity: Goal: Risk for impaired skin integrity will decrease Outcome: Progressing

## 2022-10-24 NOTE — Progress Notes (Signed)
Progress Note   Patient: Connie Swanson ZOX:[id number] DOB: [birth year] DOA: 10/21/2022     3 DOS: the patient was seen and examined on 10/24/2022   Brief hospital course: Connie Swanson was admitted to the hospital with the working diagnosis of acute cholangitis with cholelithiasis.   82/F with history of chronic diastolic CHF, CAD, complete heart block with pacer, type 2 diabetes mellitus, hypertension, dyslipidemia presented to the ED with right-sided and epigastric abdominal pain that started 10/11, associated with nausea and vomiting.  -In addition had cough and congestion for a few weeks, was on antibiotics for?  Pneumonia -In the ED she was afebrile, blood pressure 140s,, labs noted creatinine of 1.9, glucose 228, WBC 13, troponin 11,, chest x-ray with some edema and?  Infiltrate versus atelectasis, CT abdomen noted cholelithiasis with gallbladder wall thickening, opacities at lung bases -RUQ ultrasound with CBD dilation -10/14 a.m. worsening leukocytosis, kidney function, significantly elevated LFTs and bilirubin, GI consulted for ERCP, HIDA scan concerning for biliary obstruction -10/15, worsening kidney function, LFTs remain elevated  Assessment and Plan: * Acute cholecystitis Cholangitis (complicated with sepsis present on admission).   10/15 ERCP with cholelithiasis with increased gallbladder wall thickening and pericholecystic fluid, suspicious for developing acute cholecystitis.  Mild extrahepatic bilary dilatation without evidence of choledocholithiasis.  Several small cystic lesions in the pancreas, largest measuring 1,7 cm in the pancreatic body. (Follow up in 2 years).   Wbc is down to 9,7 and patient has been afebrile.  Cultures with no growth.  AST 150, ALT 290, ALK P 429, Total bilirubin 1,0   Plan to continue antibiotic therapy with ceftriaxone and metronidazole.  Pain control with as needed hydromorphone.  As needed antiemetic with zofran.  Plan for cholecystectomy  today.  Add incentive spirometer. PT, OT and out of bed tid with meals.   Acute on chronic heart failure with preserved ejection fraction (HCC) Echocardiogram with preserved LV systolic function 70 to 75%, moderate LVH, RV mild enlargement, RVSP 54.0 mmHg. La with mild to moderate dilatation, RA with mild dilatation.   Patient today euvolemic.  Continue gently hydration with isotonic saline, reduce rate to 50 ml per hr.  Hold on SGLT 2 inh for now.    CKD (chronic kidney disease), stage IV (HCC) Renal function with serum cr at 3,37 with K at 4,6 and serum bicarbonate at 21. Na 135  Plan to continue isotonic saline, reduce rate to 50 ml per hr Follow up renal function and electrolytes in am.   Hypertensive urgency Continue blood pressure control with hydralazine prn.  CAD (coronary artery disease) No chest pain, no acute coronary syndrome,.   Type 2 diabetes mellitus with hyperlipidemia (HCC) Continue glucose cover and monitoring with insulin sliding scale.  Continue statin for now (liver enzymes are trending down).   Obesity, class 3 Calculated BMI is 65,2 consistent with obesity class 3.         Subjective: Patient had abdominal pain last night (after eating) and required IV analgesics, this am pain is better controlled, she has no nausea or vomiting.   Physical Exam: Vitals:   10/23/22 1650 10/23/22 1940 10/24/22 0504 10/24/22 0744  BP: (!) 115/49 (!) 128/50 137/65 133/71  Pulse: 85 87 92 89  Resp: 17 20 20 16   Temp: 98 F (36.7 C) 98.7 F (37.1 C) 98 F (36.7 C) 98.2 F (36.8 C)  TempSrc: Oral Oral Oral Oral  SpO2: 91% 93% 93% 93%  Weight:      Height:  Neurology awake and alert ENT with mild pallor, no icterus Cardiovascular with S1 and S2 present and regular with no gallops, rubs or murmurs Respiratory with no wheezing, no rhonchi, positive rales at bases more left than right.  Abdomen with no distention  No lower extremity edema  Data  Reviewed:    Family Communication: I spoke with patient's daughter at the bedside, we talked in detail about patient's condition, plan of care and prognosis and all questions were addressed.   Disposition: Status is: Inpatient Remains inpatient appropriate because: IV antibiotics and surgical intervention   Planned Discharge Destination: Home    Author: Coralie Keens, MD 10/24/2022 10:20 AM  For on call review [email or web address].

## 2022-10-24 NOTE — Progress Notes (Signed)
BRIEF PROGRESS NOTE:  MRCP results reviewed, no choledocholithiasis, findings concerning for cholecystitis, liver enzymes improving. General Surgery plan noted for laparoscopic cholecystectomy and intraoperative cholangiogram. Will await surgery results and follow up with patient as appropriate.   Liliane Shi, DO [facility]

## 2022-10-24 NOTE — Assessment & Plan Note (Signed)
No chest pain, no acute coronary syndrome,.

## 2022-10-24 NOTE — Progress Notes (Addendum)
Subjective: MRCP suggestive of cholecystitis without choledocho. She reports ongoing pain with eating.    Tbili this morning Tbili 1.0 from 2.2, AST/ALT downtrending, Alk phos up to 429 WBC 10  Cr 3.4 (3.5)  ROS: See above, otherwise other systems negative  Objective: Vital signs in last 24 hours: Temp:  [98 F (36.7 C)-98.7 F (37.1 C)] 98.2 F (36.8 C) (10/16 0744) Pulse Rate:  [85-92] 89 (10/16 0744) Resp:  [16-20] 16 (10/16 0744) BP: (115-137)/(49-71) 133/71 (10/16 0744) SpO2:  [91 %-93 %] 93 % (10/16 0744)    Intake/Output from previous day: 10/15 0701 - 10/16 0700 In: 720 [P.O.:720] Out: -  Intake/Output this shift: No intake/output data recorded.  PE: Gen: elderly female, NAD Resp: equal chest rise CV: RRR Abd: soft, non-distended, TTP in the RUQ, + guarding, no peritoneal signs  Lab Results:  Recent Labs    10/23/22 0632 10/24/22 0659  WBC 13.2* 9.7  HGB 11.1* 10.5*  HCT 34.8* 32.8*  PLT 234 228   BMET Recent Labs    10/23/22 0632 10/24/22 0659  NA 135 135  K 4.9 4.6  CL 102 102  CO2 23 21*  GLUCOSE 120* 167*  BUN 48* 53*  CREATININE 3.47* 3.37*  CALCIUM 8.7* 8.3*   PT/INR No results for input(s): "LABPROT", "INR" in the last 72 hours. CMP     Component Value Date/Time   NA 135 10/24/2022 0659   NA 139 05/17/2022 1413   K 4.6 10/24/2022 0659   CL 102 10/24/2022 0659   CO2 21 (L) 10/24/2022 0659   GLUCOSE 167 (H) 10/24/2022 0659   BUN 53 (H) 10/24/2022 0659   BUN 29 (H) 05/17/2022 1413   CREATININE 3.37 (H) 10/24/2022 0659   CALCIUM 8.3 (L) 10/24/2022 0659   PROT 5.9 (L) 10/24/2022 0659   ALBUMIN 2.2 (L) 10/24/2022 0659   AST 150 (H) 10/24/2022 0659   ALT 290 (H) 10/24/2022 0659   ALKPHOS 429 (H) 10/24/2022 0659   BILITOT 1.0 10/24/2022 0659   GFRNONAA 13 (L) 10/24/2022 0659   Lipase     Component Value Date/Time   LIPASE 35 10/21/2022 0216    Studies/Results: MR 3D Recon At Scanner  Result Date:  10/23/2022 CLINICAL DATA:  Right upper quadrant abdominal pain with nausea and vomiting. Gallstone on CT, biliary dilatation on ultrasound and no biliary excretion on nuclear medicine hepatobiliary scan. EXAM: MRI ABDOMEN WITHOUT CONTRAST  (INCLUDING MRCP) TECHNIQUE: Multiplanar multisequence MR imaging of the abdomen was performed. Heavily T2-weighted images of the biliary and pancreatic ducts were obtained, and three-dimensional MRCP images were rendered by post processing. COMPARISON:  Abdominal CT and ultrasound 10/21/2022. Nuclear medicine hepatobiliary scan 10/22/2022. FINDINGS: Lower chest: Cardiomegaly and bibasilar atelectasis noted. No significant pleural or pericardial effusion. Patient has a pacemaker. Hepatobiliary: The liver has a non cirrhotic morphology. No significant steatosis. No focal lesions are identified on noncontrast imaging. There is mild periportal edema. Small gallstone and dependent sludge noted in the gallbladder lumen. Compared with the recent CT, there is increased gallbladder wall thickening and pericholecystic fluid, suspicious for developing acute cholecystitis. No significant intrahepatic biliary dilatation. The extrahepatic biliary system remains dilated with the common hepatic duct measuring up to 11 mm in diameter. The common bile duct tapers distally. No evidence of choledocholithiasis. Pancreas: No pancreatic ductal dilatation or evidence of acute pancreatitis. There are several cystic pancreatic lesions, largest measuring 1.7 cm in the pancreatic body on image 23/3. There is a smaller  cyst more distally in the pancreatic tail measuring 0.8 cm on image 23/3. These demonstrate no aggressive characteristics on noncontrast imaging. Spleen: Normal in size without focal abnormality. Adrenals/Urinary Tract: Both adrenal glands appear normal. No evidence of renal mass or hydronephrosis. There is a small cyst in the lower pole of the right kidney for which no specific follow-up  imaging is recommended. Stomach/Bowel: The stomach appears unremarkable for its degree of distension. No evidence of bowel wall thickening, distention or surrounding inflammatory change. Vascular/Lymphatic: There are no enlarged abdominal lymph nodes. Aortic atherosclerosis, better seen on CT. Other: No evidence of abdominal wall hernia or ascites. Musculoskeletal: No acute or significant osseous findings. Mild dependent subcutaneous edema in the back without focal fluid collection. Unless specific follow-up recommendations are mentioned in the findings or impression sections, no imaging follow-up of any mentioned incidental findings is recommended. IMPRESSION: 1. Cholelithiasis with increased gallbladder wall thickening and pericholecystic fluid, suspicious for developing acute cholecystitis. Correlate clinically. 2. Mild extrahepatic biliary dilatation without evidence of choledocholithiasis. 3. Several small cystic lesions in the pancreas, largest measuring 1.7 cm in the pancreatic body. No aggressive characteristics on noncontrast imaging. These may be postinflammatory or reflect side branch IPMN's. Recommend follow up pre and post-contrast MRI or pancreas protocol CT in 2 years. 4. Cardiomegaly and bibasilar atelectasis. Electronically Signed   By: Carey Bullocks M.D.   On: 10/23/2022 16:56   MR ABDOMEN MRCP WO CONTRAST  Result Date: 10/23/2022 CLINICAL DATA:  Right upper quadrant abdominal pain with nausea and vomiting. Gallstone on CT, biliary dilatation on ultrasound and no biliary excretion on nuclear medicine hepatobiliary scan. EXAM: MRI ABDOMEN WITHOUT CONTRAST  (INCLUDING MRCP) TECHNIQUE: Multiplanar multisequence MR imaging of the abdomen was performed. Heavily T2-weighted images of the biliary and pancreatic ducts were obtained, and three-dimensional MRCP images were rendered by post processing. COMPARISON:  Abdominal CT and ultrasound 10/21/2022. Nuclear medicine hepatobiliary scan 10/22/2022.  FINDINGS: Lower chest: Cardiomegaly and bibasilar atelectasis noted. No significant pleural or pericardial effusion. Patient has a pacemaker. Hepatobiliary: The liver has a non cirrhotic morphology. No significant steatosis. No focal lesions are identified on noncontrast imaging. There is mild periportal edema. Small gallstone and dependent sludge noted in the gallbladder lumen. Compared with the recent CT, there is increased gallbladder wall thickening and pericholecystic fluid, suspicious for developing acute cholecystitis. No significant intrahepatic biliary dilatation. The extrahepatic biliary system remains dilated with the common hepatic duct measuring up to 11 mm in diameter. The common bile duct tapers distally. No evidence of choledocholithiasis. Pancreas: No pancreatic ductal dilatation or evidence of acute pancreatitis. There are several cystic pancreatic lesions, largest measuring 1.7 cm in the pancreatic body on image 23/3. There is a smaller cyst more distally in the pancreatic tail measuring 0.8 cm on image 23/3. These demonstrate no aggressive characteristics on noncontrast imaging. Spleen: Normal in size without focal abnormality. Adrenals/Urinary Tract: Both adrenal glands appear normal. No evidence of renal mass or hydronephrosis. There is a small cyst in the lower pole of the right kidney for which no specific follow-up imaging is recommended. Stomach/Bowel: The stomach appears unremarkable for its degree of distension. No evidence of bowel wall thickening, distention or surrounding inflammatory change. Vascular/Lymphatic: There are no enlarged abdominal lymph nodes. Aortic atherosclerosis, better seen on CT. Other: No evidence of abdominal wall hernia or ascites. Musculoskeletal: No acute or significant osseous findings. Mild dependent subcutaneous edema in the back without focal fluid collection. Unless specific follow-up recommendations are mentioned in the findings or impression  sections, no  imaging follow-up of any mentioned incidental findings is recommended. IMPRESSION: 1. Cholelithiasis with increased gallbladder wall thickening and pericholecystic fluid, suspicious for developing acute cholecystitis. Correlate clinically. 2. Mild extrahepatic biliary dilatation without evidence of choledocholithiasis. 3. Several small cystic lesions in the pancreas, largest measuring 1.7 cm in the pancreatic body. No aggressive characteristics on noncontrast imaging. These may be postinflammatory or reflect side branch IPMN's. Recommend follow up pre and post-contrast MRI or pancreas protocol CT in 2 years. 4. Cardiomegaly and bibasilar atelectasis. Electronically Signed   By: Carey Bullocks M.D.   On: 10/23/2022 16:56   ECHOCARDIOGRAM COMPLETE  Result Date: 10/22/2022    ECHOCARDIOGRAM REPORT   Patient Name:   Connie Swanson Date of Exam: 10/22/2022 Medical Rec #:  [id number]   Height:       61.0 in Accession #:    [id number]  Weight:       198.0 lb Date of Birth:  [birth year]   BSA:          1.881 m Patient Age:    82 years    BP:           134/55 mmHg Patient Gender: F           HR:           89 bpm. Exam Location:  Inpatient Procedure: 2D Echo, Color Doppler and Cardiac Doppler Indications:    acute diastolic chf  History:        Patient has prior history of Echocardiogram examinations, most                 recent 10/19/2021. Pacemaker, chronic kidney disease,                 Arrythmias:LBBB; Risk Factors:Diabetes, Hypertension and                 Dyslipidemia.  Sonographer:    Delcie Roch RDCS Referring Phys: [phone] RONDELL A SMITH  Sonographer Comments: Technically difficult study due to poor echo windows. Image acquisition challenging due to patient body habitus and Image acquisition challenging due to respiratory motion. IMPRESSIONS  1. Left ventricular ejection fraction, by estimation, is 70 to 75%. The left ventricle has hyperdynamic function. The left ventricle has no regional wall motion  abnormalities. There is moderate concentric left ventricular hypertrophy. Left ventricular diastolic parameters are indeterminate.  2. Right ventricular systolic function is normal. The right ventricular size is mildly enlarged. There is moderately elevated pulmonary artery systolic pressure. The estimated right ventricular systolic pressure is 54.0 mmHg.  3. Left atrial size was mild to moderately dilated.  4. Right atrial size was mildly dilated.  5. The mitral valve is degenerative. No evidence of mitral valve regurgitation. No evidence of mitral stenosis. Moderate mitral annular calcification.  6. The aortic valve is tricuspid. There is mild calcification of the aortic valve. Aortic valve regurgitation is not visualized. Aortic valve sclerosis/calcification is present, without any evidence of aortic stenosis. Aortic valve area, by VTI measures  2.80 cm. Aortic valve mean gradient measures 8.0 mmHg. Aortic valve Vmax measures 2.11 m/s.  7. The inferior vena cava is normal in size with greater than 50% respiratory variability, suggesting right atrial pressure of 3 mmHg. FINDINGS  Left Ventricle: Left ventricular ejection fraction, by estimation, is 70 to 75%. The left ventricle has hyperdynamic function. The left ventricle has no regional wall motion abnormalities. Definity contrast agent was given IV to delineate the left ventricular endocardial borders. The  left ventricular internal cavity size was normal in size. There is moderate concentric left ventricular hypertrophy. Left ventricular diastolic parameters are indeterminate. Right Ventricle: The right ventricular size is mildly enlarged. No increase in right ventricular wall thickness. Right ventricular systolic function is normal. There is moderately elevated pulmonary artery systolic pressure. The tricuspid regurgitant velocity is 3.57 m/s, and with an assumed right atrial pressure of 3 mmHg, the estimated right ventricular systolic pressure is 54.0 mmHg.  Left Atrium: Left atrial size was mild to moderately dilated. Right Atrium: Right atrial size was mildly dilated. Pericardium: There is no evidence of pericardial effusion. Mitral Valve: The mitral valve is degenerative in appearance. Moderate mitral annular calcification. No evidence of mitral valve regurgitation. No evidence of mitral valve stenosis. MV peak gradient, 10.5 mmHg. The mean mitral valve gradient is 4.0 mmHg. Tricuspid Valve: The tricuspid valve is normal in structure. Tricuspid valve regurgitation is trivial. No evidence of tricuspid stenosis. Aortic Valve: The aortic valve is tricuspid. There is mild calcification of the aortic valve. Aortic valve regurgitation is not visualized. Aortic valve sclerosis/calcification is present, without any evidence of aortic stenosis. Aortic valve mean gradient measures 8.0 mmHg. Aortic valve peak gradient measures 17.8 mmHg. Aortic valve area, by VTI measures 2.80 cm. Pulmonic Valve: The pulmonic valve was normal in structure. Pulmonic valve regurgitation is not visualized. No evidence of pulmonic stenosis. Aorta: The aortic root is normal in size and structure. Venous: The inferior vena cava is normal in size with greater than 50% respiratory variability, suggesting right atrial pressure of 3 mmHg. IAS/Shunts: No atrial level shunt detected by color flow Doppler.  LEFT VENTRICLE PLAX 2D LV IVS:        1.50 cm   Diastology LVOT diam:     2.10 cm   LV e' medial:  8.27 cm/s LV SV:         92        LV e' lateral: 4.68 cm/s LV SV Index:   49 LVOT Area:     3.46 cm  RIGHT VENTRICLE             IVC RV S prime:     10.60 cm/s  IVC diam: 1.50 cm TAPSE (M-mode): 1.1 cm LEFT ATRIUM             Index        RIGHT ATRIUM           Index LA Vol (A2C):   62.1 ml 33.02 ml/m  RA Area:     15.30 cm LA Vol (A4C):   65.3 ml 34.72 ml/m  RA Volume:   38.50 ml  20.47 ml/m LA Biplane Vol: 69.3 ml 36.85 ml/m  AORTIC VALVE AV Area (Vmax):    2.76 cm AV Area (Vmean):   2.91 cm AV  Area (VTI):     2.80 cm AV Vmax:           211.00 cm/s AV Vmean:          125.000 cm/s AV VTI:            0.330 m AV Peak Grad:      17.8 mmHg AV Mean Grad:      8.0 mmHg LVOT Vmax:         168.00 cm/s LVOT Vmean:        105.000 cm/s LVOT VTI:          0.267 m LVOT/AV VTI ratio: 0.81  AORTA Ao Root diam: 2.90 cm  Ao Asc diam:  2.80 cm MITRAL VALVE             TRICUSPID VALVE MV Area VTI:  3.16 cm   TR Peak grad:   51.0 mmHg MV Peak grad: 10.5 mmHg  TR Vmax:        357.00 cm/s MV Mean grad: 4.0 mmHg MV Vmax:      1.62 m/s   SHUNTS MV Vmean:     90.3 cm/s  Systemic VTI:  0.27 m                          Systemic Diam: 2.10 cm Arvilla Meres MD Electronically signed by Arvilla Meres MD Signature Date/Time: 10/22/2022/5:54:53 PM    Final    NM Hepatobiliary Liver Func  Result Date: 10/22/2022 CLINICAL DATA:  Elevated bilirubin. Elevated liver enzymes. RIGHT upper quadrant pain nausea and vomiting. Indeterminate ultrasound of the RIGHT upper quadrant. EXAM: NUCLEAR MEDICINE HEPATOBILIARY IMAGING TECHNIQUE: Sequential images of the abdomen were obtained out to 60 minutes following intravenous administration of radiopharmaceutical. RADIOPHARMACEUTICALS:  7.5 mCi Tc-14m  Choletec IV COMPARISON:  CT and ultrasound 10/21/2022 FINDINGS: Prompt clearance radiotracer from blood pool and homogeneous uptake in liver. No evidence of biliary excretion is evident within the common bile duct over the 2 hour interval. As expected with no activity in the common bile duct, the gallbladder fails to fill. IMPRESSION: No excretion of radiotracer from the liver. Differential would include biliary obstruction versus hepatic dysfunction such as hepatitis. Favor hepatic dysfunction. Electronically Signed   By: Genevive Bi M.D.   On: 10/22/2022 15:26    Anti-infectives: Anti-infectives (From admission, onward)    Start     Dose/Rate Route Frequency Ordered Stop   10/22/22 1000  cefTRIAXone (ROCEPHIN) 2 g in sodium chloride  0.9 % 100 mL IVPB        2 g 200 mL/hr over 30 Minutes Intravenous Every 24 hours 10/21/22 0938     10/22/22 0800  metroNIDAZOLE (FLAGYL) IVPB 500 mg        500 mg 100 mL/hr over 60 Minutes Intravenous Every 12 hours 10/22/22 0717     10/21/22 0830  cefTRIAXone (ROCEPHIN) 2 g in sodium chloride 0.9 % 100 mL IVPB        2 g 200 mL/hr over 30 Minutes Intravenous  Once 10/21/22 0828 10/21/22 0933   10/21/22 0545  metroNIDAZOLE (FLAGYL) IVPB 500 mg        500 mg 100 mL/hr over 60 Minutes Intravenous  Once 10/21/22 0541 10/21/22 0719   10/21/22 0400  cefTRIAXone (ROCEPHIN) 1 g in sodium chloride 0.9 % 100 mL IVPB        1 g 200 mL/hr over 30 Minutes Intravenous  Once 10/21/22 0358 10/21/22 0549       Assessment/Plan  82 y/o F w/ a hx of HTN, HLD, HF w/ preserved EF, CAD, heart block s/p pacer, and DM who presented with abdominal pain and had imaging c/f cholecystitis   - MRPC suggestive of acute cholecystitis and Tbili downtrending.  Will plan for lap chole with IOC today. - NPO for now - We discussed the benefits and potential risks of surgery, including but not limited to: bleeding, infection, bile leak, biliary injury, bowel injury, and need for additional procedure.  She is at an elevated risk for surgery given her comorbidity, but she was evaluated by cardiology and is considered optimized for surgery.  I discussed with her that  if there are any intra-operative concerns for a cardiac issue that the plan would be to abort and proceed with perc chole tube.  Otherwise, I feel it is reasonable to proceed.    I reviewed last 24 h vitals and pain scores, last 24 h labs and trends, and last 24 h imaging results.  This care required moderate level of medical decision making.    LOS: 3 days   [facility] 10/24/2022, 9:03 AM Please see Amion for pager number during day hours 7:00am-4:30pm or 7:00am -11:30am on weekends

## 2022-10-25 ENCOUNTER — Encounter (HOSPITAL_COMMUNITY): Payer: Self-pay | Admitting: General Surgery

## 2022-10-25 ENCOUNTER — Ambulatory Visit: Payer: Self-pay

## 2022-10-25 DIAGNOSIS — Z01810 Encounter for preprocedural cardiovascular examination: Secondary | ICD-10-CM | POA: Diagnosis not present

## 2022-10-25 DIAGNOSIS — I5033 Acute on chronic diastolic (congestive) heart failure: Secondary | ICD-10-CM | POA: Diagnosis not present

## 2022-10-25 DIAGNOSIS — I160 Hypertensive urgency: Secondary | ICD-10-CM | POA: Diagnosis not present

## 2022-10-25 DIAGNOSIS — N184 Chronic kidney disease, stage 4 (severe): Secondary | ICD-10-CM | POA: Diagnosis not present

## 2022-10-25 DIAGNOSIS — K810 Acute cholecystitis: Secondary | ICD-10-CM | POA: Diagnosis not present

## 2022-10-25 LAB — COMPREHENSIVE METABOLIC PANEL
ALT: 213 U/L — ABNORMAL HIGH (ref 0–44)
AST: 107 U/L — ABNORMAL HIGH (ref 15–41)
Albumin: 2.1 g/dL — ABNORMAL LOW (ref 3.5–5.0)
Alkaline Phosphatase: 407 U/L — ABNORMAL HIGH (ref 38–126)
Anion gap: 12 (ref 5–15)
BUN: 56 mg/dL — ABNORMAL HIGH (ref 8–23)
CO2: 20 mmol/L — ABNORMAL LOW (ref 22–32)
Calcium: 8.7 mg/dL — ABNORMAL LOW (ref 8.9–10.3)
Chloride: 107 mmol/L (ref 98–111)
Creatinine, Ser: 3.01 mg/dL — ABNORMAL HIGH (ref 0.44–1.00)
GFR, Estimated: 15 mL/min — ABNORMAL LOW (ref >60)
Glucose, Bld: 169 mg/dL — ABNORMAL HIGH (ref 70–99)
Potassium: 5.5 mmol/L — ABNORMAL HIGH (ref 3.5–5.1)
Sodium: 139 mmol/L (ref 135–145)
Total Bilirubin: 0.8 mg/dL (ref 0.3–1.2)
Total Protein: 6 g/dL — ABNORMAL LOW (ref 6.5–8.1)

## 2022-10-25 LAB — CBC
HCT: 33.2 % — ABNORMAL LOW (ref 36.0–46.0)
Hemoglobin: 10.9 g/dL — ABNORMAL LOW (ref 12.0–15.0)
MCH: 33.2 pg (ref 26.0–34.0)
MCHC: 32.8 g/dL (ref 30.0–36.0)
MCV: 101.2 fL — ABNORMAL HIGH (ref 80.0–100.0)
Platelets: 233 10*3/uL (ref 150–400)
RBC: 3.28 MIL/uL — ABNORMAL LOW (ref 3.87–5.11)
RDW: 16 % — ABNORMAL HIGH (ref 11.5–15.5)
WBC: 8.9 10*3/uL (ref 4.0–10.5)
nRBC: 0 % (ref 0.0–0.2)

## 2022-10-25 LAB — GLUCOSE, CAPILLARY
Glucose-Capillary: 162 mg/dL — ABNORMAL HIGH (ref 70–99)
Glucose-Capillary: 154 mg/dL — ABNORMAL HIGH (ref 70–99)
Glucose-Capillary: 202 mg/dL — ABNORMAL HIGH (ref 70–99)
Glucose-Capillary: 208 mg/dL — ABNORMAL HIGH (ref 70–99)
Glucose-Capillary: 176 mg/dL — ABNORMAL HIGH (ref 70–99)

## 2022-10-25 LAB — SURGICAL PATHOLOGY

## 2022-10-25 MED ORDER — SACUBITRIL-VALSARTAN 24-26 MG PO TABS
1.0000 | ORAL_TABLET | Freq: Two times a day (BID) | ORAL | Status: DC
  Administered 2022-10-25: 1 via ORAL
  Filled 2022-10-25 (×3): qty 1

## 2022-10-25 MED ORDER — METOPROLOL SUCCINATE ER 25 MG PO TB24
50.0000 mg | ORAL_TABLET | Freq: Every day | ORAL | Status: DC
  Administered 2022-10-25 – 2022-10-26 (×2): 50 mg via ORAL
  Filled 2022-10-25 (×2): qty 2

## 2022-10-25 MED ORDER — SODIUM ZIRCONIUM CYCLOSILICATE 10 G PO PACK
10.0000 g | PACK | Freq: Once | ORAL | Status: AC
  Administered 2022-10-25: 10 g via ORAL
  Filled 2022-10-25: qty 1

## 2022-10-25 NOTE — Progress Notes (Signed)
1 Day Post-Op  Subjective: Reports that her abdominal pain is improved.  She is hungry and would like to eat this morning.   Tbili, LFTs, and Cr downtrending this morning.   ROS: See above, otherwise other systems negative  Objective: Vital signs in last 24 hours: Temp:  [97.5 F (36.4 C)-98.5 F (36.9 C)] 98 F (36.7 C) (10/17 0811) Pulse Rate:  [74-92] 74 (10/17 0811) Resp:  [16-26] 17 (10/17 0811) BP: (110-156)/(53-83) 150/72 (10/17 0811) SpO2:  [90 %-100 %] 95 % (10/17 0811) Last BM Date : 10/19/22  Intake/Output from previous day: 10/16 0701 - 10/17 0700 In: 520 [I.V.:520] Out: 50 [Blood:50] Intake/Output this shift: No intake/output data recorded.  PE: Gen: elderly female, NAD Resp: equal chest rise CV: RRR Abd: soft, non-distended, appropriately tender, port sites clean and dry with dermabond intact  Lab Results:  Recent Labs    10/24/22 0659 10/25/22 0702  WBC 9.7 8.9  HGB 10.5* 10.9*  HCT 32.8* 33.2*  PLT 228 233   BMET Recent Labs    10/24/22 0659 10/25/22 0702  NA 135 139  K 4.6 5.5*  CL 102 107  CO2 21* 20*  GLUCOSE 167* 169*  BUN 53* 56*  CREATININE 3.37* 3.01*  CALCIUM 8.3* 8.7*   PT/INR No results for input(s): "LABPROT", "INR" in the last 72 hours. CMP     Component Value Date/Time   NA 139 10/25/2022 0702   NA 139 05/17/2022 1413   K 5.5 (H) 10/25/2022 0702   CL 107 10/25/2022 0702   CO2 20 (L) 10/25/2022 0702   GLUCOSE 169 (H) 10/25/2022 0702   BUN 56 (H) 10/25/2022 0702   BUN 29 (H) 05/17/2022 1413   CREATININE 3.01 (H) 10/25/2022 0702   CALCIUM 8.7 (L) 10/25/2022 0702   PROT 6.0 (L) 10/25/2022 0702   ALBUMIN 2.1 (L) 10/25/2022 0702   AST 107 (H) 10/25/2022 0702   ALT 213 (H) 10/25/2022 0702   ALKPHOS 407 (H) 10/25/2022 0702   BILITOT 0.8 10/25/2022 0702   GFRNONAA 15 (L) 10/25/2022 0702   Lipase     Component Value Date/Time   LIPASE 35 10/21/2022 0216    Studies/Results: No results  found.  Anti-infectives: Anti-infectives (From admission, onward)    Start     Dose/Rate Route Frequency Ordered Stop   10/22/22 1000  cefTRIAXone (ROCEPHIN) 2 g in sodium chloride 0.9 % 100 mL IVPB        2 g 200 mL/hr over 30 Minutes Intravenous Every 24 hours 10/21/22 0938     10/22/22 0800  metroNIDAZOLE (FLAGYL) IVPB 500 mg        500 mg 100 mL/hr over 60 Minutes Intravenous Every 12 hours 10/22/22 0717     10/21/22 0830  cefTRIAXone (ROCEPHIN) 2 g in sodium chloride 0.9 % 100 mL IVPB        2 g 200 mL/hr over 30 Minutes Intravenous  Once 10/21/22 0828 10/21/22 0933   10/21/22 0545  metroNIDAZOLE (FLAGYL) IVPB 500 mg        500 mg 100 mL/hr over 60 Minutes Intravenous  Once 10/21/22 0541 10/21/22 0719   10/21/22 0400  cefTRIAXone (ROCEPHIN) 1 g in sodium chloride 0.9 % 100 mL IVPB        1 g 200 mL/hr over 30 Minutes Intravenous  Once 10/21/22 0358 10/21/22 0549       Assessment/Plan  82 y/o F w/ a hx of HTN, HLD, HF w/ preserved EF, CAD, heart  block s/p pacer, and DM who presented with abdominal pain and had imaging c/f cholecystitis   Cholecystitis s/p lap chole 10/16 - LFTs downtrending and abdominal pain is well controlled - Regular diet - No indication for ongoing antibiotics related to her GB - Follow up with general surgery 2-3 weeks post-op  Dispo: per primary  Surgery will continue to follow while admitted.    I reviewed last 24 h vitals and pain scores, last 24 h labs and trends, and last 24 h imaging results.  This care required moderate level of medical decision making.    LOS: 4 days   [facility] 10/25/2022, 9:58 AM Please see Amion for pager number during day hours 7:00am-4:30pm or 7:00am -11:30am on weekends

## 2022-10-25 NOTE — Progress Notes (Signed)
Progress Note   Patient: Connie Swanson EPP:[id number] DOB: 11-17-1940 DOA: 10/21/2022     4 DOS: the patient was seen and examined on 10/25/2022   Brief hospital course: Mrs. Goans was admitted to the hospital with the working diagnosis of acute cholangitis with cholelithiasis.   82/F with history of chronic diastolic CHF, CAD, complete heart block with pacer, type 2 diabetes mellitus, hypertension, dyslipidemia presented to the ED with right-sided and epigastric abdominal pain that started 10/11, associated with nausea and vomiting.  -In addition had cough and congestion for a few weeks, was on antibiotics for?  Pneumonia -In the ED she was afebrile, blood pressure 140s,, labs noted creatinine of 1.9, glucose 228, WBC 13, troponin 11,, chest x-ray with some edema and?  Infiltrate versus atelectasis, CT abdomen noted cholelithiasis with gallbladder wall thickening, opacities at lung bases -RUQ ultrasound with CBD dilation -10/14 a.m. worsening leukocytosis, kidney function, significantly elevated LFTs and bilirubin, GI consulted for ERCP, HIDA scan concerning for biliary obstruction -10/15, worsening kidney function, LFTs remain elevated  Assessment and Plan: * Acute cholecystitis Cholangitis (complicated with sepsis present on admission).   10/15 ERCP with cholelithiasis with increased gallbladder wall thickening and pericholecystic fluid, suspicious for developing acute cholecystitis.  Mild extrahepatic bilary dilatation without evidence of choledocholithiasis.  Several small cystic lesions in the pancreas, largest measuring 1,7 cm in the pancreatic body. (Follow up in 2 years).   10.16 cholecystectomy (laparoscopic with intra operative cholangiography).    Wbc is down to 10,9 and patient has been afebrile.  Cultures with no growth.  AST 107, ALT 213, ALK P 407, Total bilirubin 0,8    Pain control with as needed hydromorphone.  As needed antiemetic with zofran.  Discontinue antibiotic  therapy.   Continue with  incentive spirometer. PT, OT and out of bed tid with meals.   Acute on chronic heart failure with preserved ejection fraction (HCC) Echocardiogram with preserved LV systolic function 70 to 75%, moderate LVH, RV mild enlargement, RVSP 54.0 mmHg. La with mild to moderate dilatation, RA with mild dilatation.   Patient today euvolemic.   Hold on IV fluids and diuretics for now.  Start patient on entresto.   CKD (chronic kidney disease), stage IV (HCC) Hyperkalemia.  Renal function with serum cr at 3,0 with K at 5,5 and serum bicarbonate at 20. Na 139   Hold on IV fluid. Add one dose of sodium zirconium for hyperkalemia and follow up renal function and electrolytes in am.   Hypertensive urgency Continue blood pressure control with hydralazine prn.  CAD (coronary artery disease) No chest pain, no acute coronary syndrome,.   Type 2 diabetes mellitus with hyperlipidemia (HCC) Continue glucose cover and monitoring with insulin sliding scale.  Continue statin for now (liver enzymes are trending down).   Obesity, class 3 Calculated BMI is 65,2 consistent with obesity class 3.         Subjective: Patient is out of bed to the chair, tolerating po well, with no nausea or vomiting. No abdominal pain   Physical Exam: Vitals:   10/24/22 2329 10/25/22 0346 10/25/22 0811 10/25/22 1004  BP: (!) 110/59 (!) 155/53 (!) 150/72 (!) 153/64  Pulse: 84 80 74 77  Resp: 19 20 17    Temp: (!) 97.5 F (36.4 C) (!) 97.5 F (36.4 C) 98 F (36.7 C)   TempSrc: Oral Oral    SpO2: 90% 97% 95%   Weight:      Height:  Neurology awake and alert ENT with mild pallor Cardiovascular with S1 and S2 present and regular with no gallops, rubs or murmur Respiratory with no rales or wheezing no rhonchi Abdomen with no distention, non tender to superficial palpation No lower extremity edema Data Reviewed:    Family Communication: I spoke with patient's daughter at the  bedside, we talked in detail about patient's condition, plan of care and prognosis and all questions were addressed.   Disposition: Status is: Inpatient Remains inpatient appropriate because: possible discharge home tomorrow   Planned Discharge Destination: Home    Author: Coralie Keens, MD 10/25/2022 5:23 PM  For on call review [email or web address].

## 2022-10-25 NOTE — Progress Notes (Signed)
BRIEF PROGRESS NOTE:   Laparoscopic cholecystectomy operative report reviewed with no concern for biliary obstruction. Liver enzyme panel improving. No plan for ERCP. [facility] will be available as needed.   Liliane Shi, DO [facility]

## 2022-10-25 NOTE — Progress Notes (Signed)
Cardiologist:  Tolia/Lambert  Subjective:   Post op- less pain passing gas left hand iv extravasated   Objective:  Vitals:   10/24/22 1937 10/24/22 2329 10/25/22 0346 10/25/22 0811  BP: (!) 145/67 (!) 110/59 (!) 155/53 (!) 150/72  Pulse: 87 84 80 74  Resp: 20 19 20 17   Temp: 98 F (36.7 C) (!) 97.5 F (36.4 C) (!) 97.5 F (36.4 C) 98 F (36.7 C)  TempSrc:  Oral Oral   SpO2: 91% 90% 97% 95%  Weight:      Height:        Intake/Output from previous day:  Intake/Output Summary (Last 24 hours) at 10/25/2022 0937 Last data filed at 10/25/2022 0400 Gross per 24 hour  Intake 520 ml  Output 50 ml  Net 470 ml    Physical Exam:  Elderly female Lungs clear No murmur PPM under left clavicle Post lap choly BS positive  Trace edema  Lab Results: Basic Metabolic Panel: Recent Labs    10/24/22 0659 10/25/22 0702  NA 135 139  K 4.6 5.5*  CL 102 107  CO2 21* 20*  GLUCOSE 167* 169*  BUN 53* 56*  CREATININE 3.37* 3.01*  CALCIUM 8.3* 8.7*   Liver Function Tests: Recent Labs    10/24/22 0659 10/25/22 0702  AST 150* 107*  ALT 290* 213*  ALKPHOS 429* 407*  BILITOT 1.0 0.8  PROT 5.9* 6.0*  ALBUMIN 2.2* 2.1*   No results for input(s): "LIPASE", "AMYLASE" in the last 72 hours.  CBC: Recent Labs    10/24/22 0659 10/25/22 0702  WBC 9.7 8.9  HGB 10.5* 10.9*  HCT 32.8* 33.2*  MCV 99.7 101.2*  PLT 228 233   Cardiac Enzymes: No results for input(s): "CKTOTAL", "CKMB", "CKMBINDEX", "TROPONINI" in the last 72 hours. BNP: Invalid input(s): "POCBNP" D-Dimer: No results for input(s): "DDIMER" in the last 72 hours. Hemoglobin A1C: No results for input(s): "HGBA1C" in the last 72 hours.   Imaging: MR 3D Recon At Scanner  Result Date: 10/23/2022 CLINICAL DATA:  Right upper quadrant abdominal pain with nausea and vomiting. Gallstone on CT, biliary dilatation on ultrasound and no biliary excretion on nuclear medicine hepatobiliary scan. EXAM: MRI ABDOMEN WITHOUT  CONTRAST  (INCLUDING MRCP) TECHNIQUE: Multiplanar multisequence MR imaging of the abdomen was performed. Heavily T2-weighted images of the biliary and pancreatic ducts were obtained, and three-dimensional MRCP images were rendered by post processing. COMPARISON:  Abdominal CT and ultrasound 10/21/2022. Nuclear medicine hepatobiliary scan 10/22/2022. FINDINGS: Lower chest: Cardiomegaly and bibasilar atelectasis noted. No significant pleural or pericardial effusion. Patient has a pacemaker. Hepatobiliary: The liver has a non cirrhotic morphology. No significant steatosis. No focal lesions are identified on noncontrast imaging. There is mild periportal edema. Small gallstone and dependent sludge noted in the gallbladder lumen. Compared with the recent CT, there is increased gallbladder wall thickening and pericholecystic fluid, suspicious for developing acute cholecystitis. No significant intrahepatic biliary dilatation. The extrahepatic biliary system remains dilated with the common hepatic duct measuring up to 11 mm in diameter. The common bile duct tapers distally. No evidence of choledocholithiasis. Pancreas: No pancreatic ductal dilatation or evidence of acute pancreatitis. There are several cystic pancreatic lesions, largest measuring 1.7 cm in the pancreatic body on image 23/3. There is a smaller cyst more distally in the pancreatic tail measuring 0.8 cm on image 23/3. These demonstrate no aggressive characteristics on noncontrast imaging. Spleen: Normal in size without focal abnormality. Adrenals/Urinary Tract: Both adrenal glands appear normal. No evidence of renal mass  or hydronephrosis. There is a small cyst in the lower pole of the right kidney for which no specific follow-up imaging is recommended. Stomach/Bowel: The stomach appears unremarkable for its degree of distension. No evidence of bowel wall thickening, distention or surrounding inflammatory change. Vascular/Lymphatic: There are no enlarged  abdominal lymph nodes. Aortic atherosclerosis, better seen on CT. Other: No evidence of abdominal wall hernia or ascites. Musculoskeletal: No acute or significant osseous findings. Mild dependent subcutaneous edema in the back without focal fluid collection. Unless specific follow-up recommendations are mentioned in the findings or impression sections, no imaging follow-up of any mentioned incidental findings is recommended. IMPRESSION: 1. Cholelithiasis with increased gallbladder wall thickening and pericholecystic fluid, suspicious for developing acute cholecystitis. Correlate clinically. 2. Mild extrahepatic biliary dilatation without evidence of choledocholithiasis. 3. Several small cystic lesions in the pancreas, largest measuring 1.7 cm in the pancreatic body. No aggressive characteristics on noncontrast imaging. These may be postinflammatory or reflect side branch IPMN's. Recommend follow up pre and post-contrast MRI or pancreas protocol CT in 2 years. 4. Cardiomegaly and bibasilar atelectasis. Electronically Signed   By: Carey Bullocks M.D.   On: 10/23/2022 16:56   MR ABDOMEN MRCP WO CONTRAST  Result Date: 10/23/2022 CLINICAL DATA:  Right upper quadrant abdominal pain with nausea and vomiting. Gallstone on CT, biliary dilatation on ultrasound and no biliary excretion on nuclear medicine hepatobiliary scan. EXAM: MRI ABDOMEN WITHOUT CONTRAST  (INCLUDING MRCP) TECHNIQUE: Multiplanar multisequence MR imaging of the abdomen was performed. Heavily T2-weighted images of the biliary and pancreatic ducts were obtained, and three-dimensional MRCP images were rendered by post processing. COMPARISON:  Abdominal CT and ultrasound 10/21/2022. Nuclear medicine hepatobiliary scan 10/22/2022. FINDINGS: Lower chest: Cardiomegaly and bibasilar atelectasis noted. No significant pleural or pericardial effusion. Patient has a pacemaker. Hepatobiliary: The liver has a non cirrhotic morphology. No significant steatosis. No  focal lesions are identified on noncontrast imaging. There is mild periportal edema. Small gallstone and dependent sludge noted in the gallbladder lumen. Compared with the recent CT, there is increased gallbladder wall thickening and pericholecystic fluid, suspicious for developing acute cholecystitis. No significant intrahepatic biliary dilatation. The extrahepatic biliary system remains dilated with the common hepatic duct measuring up to 11 mm in diameter. The common bile duct tapers distally. No evidence of choledocholithiasis. Pancreas: No pancreatic ductal dilatation or evidence of acute pancreatitis. There are several cystic pancreatic lesions, largest measuring 1.7 cm in the pancreatic body on image 23/3. There is a smaller cyst more distally in the pancreatic tail measuring 0.8 cm on image 23/3. These demonstrate no aggressive characteristics on noncontrast imaging. Spleen: Normal in size without focal abnormality. Adrenals/Urinary Tract: Both adrenal glands appear normal. No evidence of renal mass or hydronephrosis. There is a small cyst in the lower pole of the right kidney for which no specific follow-up imaging is recommended. Stomach/Bowel: The stomach appears unremarkable for its degree of distension. No evidence of bowel wall thickening, distention or surrounding inflammatory change. Vascular/Lymphatic: There are no enlarged abdominal lymph nodes. Aortic atherosclerosis, better seen on CT. Other: No evidence of abdominal wall hernia or ascites. Musculoskeletal: No acute or significant osseous findings. Mild dependent subcutaneous edema in the back without focal fluid collection. Unless specific follow-up recommendations are mentioned in the findings or impression sections, no imaging follow-up of any mentioned incidental findings is recommended. IMPRESSION: 1. Cholelithiasis with increased gallbladder wall thickening and pericholecystic fluid, suspicious for developing acute cholecystitis. Correlate  clinically. 2. Mild extrahepatic biliary dilatation without evidence of choledocholithiasis. 3.  Several small cystic lesions in the pancreas, largest measuring 1.7 cm in the pancreatic body. No aggressive characteristics on noncontrast imaging. These may be postinflammatory or reflect side branch IPMN's. Recommend follow up pre and post-contrast MRI or pancreas protocol CT in 2 years. 4. Cardiomegaly and bibasilar atelectasis. Electronically Signed   By: Carey Bullocks M.D.   On: 10/23/2022 16:56    Cardiac Studies:  ECG: A sense V pace rate 76    Telemetry:  Echo: 10/13 EF 70-75% moderate LVH indeterminate diastolic parameters   Medications:    allopurinol  300 mg Oral Daily   atorvastatin  40 mg Oral Daily   guaiFENesin  600 mg Oral BID   heparin  5,000 Units Subcutaneous Q8H   insulin aspart  0-6 Units Subcutaneous TID WC      cefTRIAXone (ROCEPHIN)  IV 2 g (10/24/22 0908)   metronidazole Stopped (10/25/22 0841)    Assessment/Plan:   PPM:  MRI compatible Assurity device  DDD back up rate 60 Does not appear PPM dependant with only 9% V pacing  No issues with MRERCP set back to baseline parameters  Diastolic dysfunction:  no volume overload  Cholycystitis:  now post op day one BS positive passing gas continue antibiotics per primary service   Resume home entresto and toprol  Cardiology will sign off   Charlton Haws 10/25/2022, 9:37 AM

## 2022-10-25 NOTE — Evaluation (Signed)
Physical Therapy Evaluation Patient Details Name: ANABELLE FREIERMUTH MRN: [id number] DOB: [birth year] Today's Date: 10/25/2022  History of Present Illness  Pt is a 82 yr old female who presented  10/21/22 R upper quadrant pain. Pt found to have acute cholecystitis. Pt s/p laparoscopic cholecystectomy. PMH includes: HTN, HFpEF, DM II, hypothyroidism, obesity, vertigo, CAD, CKD, pacemaker.    Clinical Impression  Pt in bed upon arrival of PT, agreeable to evaluation at this time. Prior to admission the pt was independent without use of DME, reports no recent falls, but generally sedentary lifestyle without consistent physical activity. Pt was able to demo good transfer from recliner with use of RW or IV pole, no assist needed, and was able to ambulate in the hallway with CGA using RW or IV pole. The pt does become SOB after ~50 ft but SpO2 was stable on RW but did drop to low 90s with exertion and recover to mid 90s with standing or seated rest. The pt is agreeable to continued skilled PT to progress activity tolerance and endurance, in preparation for cardiac rehab (reports she is being encouraged often to start this program by her doctors on outpatient basis). She is safe to return home with family support when medically stable for d/c.      If plan is discharge home, recommend the following: A little help with walking and/or transfers;A little help with bathing/dressing/bathroom;Assistance with cooking/housework;Help with stairs or ramp for entrance   Can travel by private vehicle        Equipment Recommendations None recommended by PT  Recommendations for Other Services       Functional Status Assessment Patient has had a recent decline in their functional status and demonstrates the ability to make significant improvements in function in a reasonable and predictable amount of time.     Precautions / Restrictions Precautions Precautions: None Precaution Comments: monitor  o2 Restrictions Weight Bearing Restrictions: No      Mobility  Bed Mobility               General bed mobility comments: presented OOB and reports they sleep in recliner    Transfers Overall transfer level: Needs assistance Equipment used: 1 person hand held assist, Rolling walker (2 wheels) (IV pole) Transfers: Sit to/from Stand Sit to Stand: Contact guard assist           General transfer comment: CGA with pt able to rise from recliner with use of UE and increased time, stable in standing    Ambulation/Gait Ambulation/Gait assistance: Contact guard assist Gait Distance (Feet): 75 Feet (+ 55ft) Assistive device: Rolling walker (2 wheels), IV Pole Gait Pattern/deviations: Step-through pattern, Decreased stride length, Trunk flexed Gait velocity: decreased     General Gait Details: pt with slow but steady gait, SOB after ~50 ft, SpO2 91% on RA. pt recovered well with standing rest. encouraged to increase regular activity to improve endurance     Balance Overall balance assessment: Needs assistance Sitting-balance support: Feet supported, No upper extremity supported Sitting balance-Leahy Scale: Good     Standing balance support: Bilateral upper extremity supported, Single extremity supported, No upper extremity supported Standing balance-Leahy Scale: Fair Standing balance comment: Pt does better when having walker due to upright posture and breathing                             Pertinent Vitals/Pain Pain Assessment Pain Assessment: Faces Faces Pain Scale: Hurts a little bit  Pain Location: abdominal Pain Descriptors / Indicators: Aching Pain Intervention(s): Limited activity within patient's tolerance, Monitored during session    Home Living Family/patient expects to be discharged to:: Private residence Living Arrangements: Children Available Help at Discharge: Family Type of Home: House Home Access: Level entry       Home Layout: One  level Home Equipment: Rollator (4 wheels);Shower seat;Cane - single point      Prior Function Prior Level of Function : Independent/Modified Independent             Mobility Comments: does not go out of the home often but loves to go out onto the porch, no regular physical activity ADLs Comments: completed sponge baths     Extremity/Trunk Assessment   Upper Extremity Assessment Upper Extremity Assessment: Defer to OT evaluation    Lower Extremity Assessment Lower Extremity Assessment: Overall WFL for tasks assessed    Cervical / Trunk Assessment Cervical / Trunk Assessment: Kyphotic  Communication   Communication Communication: No apparent difficulties  Cognition Arousal: Alert Behavior During Therapy: WFL for tasks assessed/performed Overall Cognitive Status: Within Functional Limits for tasks assessed                                          General Comments General comments (skin integrity, edema, etc.): SpO2 stable on RA, slight dip with exertion to low 90s, but recovers well with seated or standing rest .    Exercises Other Exercises Other Exercises: IS to 750   Assessment/Plan    PT Assessment Patient needs continued PT services  PT Problem List Cardiopulmonary status limiting activity;Decreased activity tolerance;Decreased balance       PT Treatment Interventions DME instruction;Functional mobility training;Gait training;Stair training;Therapeutic activities;Therapeutic exercise;Balance training    PT Goals (Current goals can be found in the Care Plan section)  Acute Rehab PT Goals Patient Stated Goal: return home, to start cardiac rehab PT Goal Formulation: With patient/family Time For Goal Achievement: 11/08/22 Potential to Achieve Goals: Good    Frequency Min 1X/week     Co-evaluation   Reason for Co-Treatment: Complexity of the patient's impairments (multi-system involvement)   OT goals addressed during session: ADL's and  self-care       AM-PAC PT "6 Clicks" Mobility  Outcome Measure Help needed turning from your back to your side while in a flat bed without using bedrails?: A Little Help needed moving from lying on your back to sitting on the side of a flat bed without using bedrails?: A Little Help needed moving to and from a bed to a chair (including a wheelchair)?: A Little Help needed standing up from a chair using your arms (e.g., wheelchair or bedside chair)?: A Little Help needed to walk in hospital room?: A Little Help needed climbing 3-5 steps with a railing? : A Little 6 Click Score: 18    End of Session Equipment Utilized During Treatment: Gait belt Activity Tolerance: Patient tolerated treatment well Patient left: in chair;with call bell/phone within reach;with family/visitor present Nurse Communication: Mobility status PT Visit Diagnosis: Unsteadiness on feet (R26.81);Difficulty in walking, not elsewhere classified (R26.2)    Time: 1030-1047 PT Time Calculation (min) (ACUTE ONLY): 17 min   Charges:   PT Evaluation $PT Eval Low Complexity: 1 Low   PT General Charges $$ ACUTE PT VISIT: 1 Visit         Vickki Muff, PT, DPT  Acute Rehabilitation Department Office [phone] Secure Chat Communication Preferred  Ronnie Derby 10/25/2022, 3:02 PM

## 2022-10-25 NOTE — Anesthesia Postprocedure Evaluation (Signed)
Anesthesia Post Note  Patient: Connie Swanson  Procedure(s) Performed: LAPAROSCOPIC CHOLECYSTECTOMY (Abdomen)     Patient location during evaluation: PACU Anesthesia Type: General Level of consciousness: awake and alert Pain management: pain level controlled Vital Signs Assessment: post-procedure vital signs reviewed and stable Respiratory status: spontaneous breathing, nonlabored ventilation and respiratory function stable Cardiovascular status: blood pressure returned to baseline Postop Assessment: no apparent nausea or vomiting Anesthetic complications: no   No notable events documented.          Shanda Howells

## 2022-10-25 NOTE — Plan of Care (Signed)
Problem: Fluid Volume: Goal: Ability to maintain a balanced intake and output will improve Outcome: Progressing   Problem: Health Behavior/Discharge Planning: Goal: Ability to manage health-related needs will improve Outcome: Progressing   Problem: Nutritional: Goal: Maintenance of adequate nutrition will improve Outcome: Progressing

## 2022-10-25 NOTE — Patient Outreach (Signed)
Care Coordination   Follow Up Visit Note   10/25/2022 Name: TIARNA CEBALLOS MRN: [id number] DOB: 1940-11-24  ZARYA BETANCOURTH is a 82 y.o. year old female who sees [name], Adelfa Koh, MD for primary care. I reviewed patient's chart today in preparation to contact patient for a nurse care coordination follow up call.   What matters to the patients health and wellness today?  NA    Goals Addressed             This Visit's Progress    RN Care Coordination Activities: further follow up is needed       Care Coordination Interventions: Reviewed patient's chart in preparation to contact patient for nurse care coordination follow up Noted patient is currently inpatient at [facility] with admit date: 10/21/22; dx: Acute Cholecystitis        SDOH assessments and interventions completed:  No     Care Coordination Interventions:  Yes, provided   Follow up plan: Follow up call scheduled for 11/01/22 @1 :15 PM    Encounter Outcome:  Patient Visit Completed

## 2022-10-25 NOTE — TOC Progression Note (Signed)
Transition of Care (TOC) - Progression Note   PT /OT recommending HHPT/OT . Spoke to patient and daughter at bedside. No preference.   Tresa Endo with [facility] accepted referral .   Patient Details  Name: Connie Swanson MRN: [id number] Date of Birth: 1940/12/05  [facility] CM/SW Contact  Veneta Sliter, Adria Devon, RN Phone Number: 10/25/2022, 4:43 PM  Clinical Narrative:       Expected Discharge Plan: Home/Self Care Barriers to Discharge: Continued Medical Work up  Expected Discharge Plan and Services   Discharge Planning Services: CM Consult Post Acute Care Choice: NA Living arrangements for the past 2 months: Single Family Home                 DME Arranged: N/A         HH Arranged: NA           Social Determinants of Health (SDOH) Interventions SDOH Screenings   Food Insecurity: No Food Insecurity (10/21/2022)  Housing: Low Risk  (10/21/2022)  Transportation Needs: No Transportation Needs (10/21/2022)  Utilities: Not At Risk (10/21/2022)  Tobacco Use: Low Risk  (10/24/2022)    Readmission Risk Interventions     No data to display

## 2022-10-25 NOTE — Evaluation (Signed)
Occupational Therapy Evaluation Patient Details Name: Connie Swanson MRN: [id number] DOB: 11-Mar-1940 Today's Date: 10/25/2022   History of Present Illness Pt is a 82 yr old female who presented  10/21/22 R upper quadrant pain. Pt found to have acute cholecystitis. Pt s/p laparoscopic cholecystectomy. PMH includes: HTN, HFpEF, DM II, hypothyroidism, obesity, vertigo, CAD, CKD, pacemaker   Clinical Impression   Connie Swanson is a very pleasant woman present with her daughter in the room. Pt and family educated about DME to assist with LB dressing and bathing due to some slight abdominal pain and SOB when reaching to LE. Pt was able to complete sit to stand transfers with supervision to CGA but just cues on pacing self. Connie Swanson was educated about energy conservation strategies in session as they would love to be able to work on cooking in the kitchen again.        If plan is discharge home, recommend the following: A little help with bathing/dressing/bathroom;Assistance with cooking/housework;Assist for transportation;Help with stairs or ramp for entrance    Functional Status Assessment  Patient has had a recent decline in their functional status and demonstrates the ability to make significant improvements in function in a reasonable and predictable amount of time.  Equipment Recommendations  None recommended by OT    Recommendations for Other Services       Precautions / Restrictions Precautions Precautions: None Precaution Comments: monitor o2 Restrictions Weight Bearing Restrictions: No      Mobility Bed Mobility               General bed mobility comments: presented OOB and reports they sleep in recliner    Transfers Overall transfer level: Needs assistance Equipment used: Rolling walker (2 wheels), 1 person hand held assist (IV pole) Transfers: Sit to/from Stand Sit to Stand: Contact guard assist                  Balance Overall balance assessment: Needs  assistance Sitting-balance support: Feet supported, No upper extremity supported Sitting balance-Leahy Scale: Good     Standing balance support: Bilateral upper extremity supported, Single extremity supported, No upper extremity supported Standing balance-Leahy Scale: Fair Standing balance comment: Pt does better when having walker due to upright posture and breathing                           ADL either performed or assessed with clinical judgement   ADL Overall ADL's : Needs assistance/impaired Eating/Feeding: Independent;Sitting   Grooming: Wash/dry hands;Wash/dry face;Contact guard assist;Standing   Upper Body Bathing: Set up;Sitting;Standing;Contact guard assist   Lower Body Bathing: Contact guard assist;Minimal assistance;Sitting/lateral leans   Upper Body Dressing : Set up;Sitting   Lower Body Dressing: Minimal assistance;Sit to/from stand;Contact guard assist   Toilet Transfer: Supervision/safety;Cueing for safety;Cueing for sequencing;Rolling walker (2 wheels)   Toileting- Clothing Manipulation and Hygiene: Contact guard assist;Sit to/from stand   Tub/ Shower Transfer: Contact guard assist;Rolling walker (2 wheels)   Functional mobility during ADLs: Contact guard assist;Rolling walker (2 wheels) (none.IV pole at some times)       Vision         Perception         Praxis         Pertinent Vitals/Pain Pain Assessment Pain Assessment: Faces Faces Pain Scale: Hurts a little bit Pain Location: abdominal Pain Descriptors / Indicators: Aching Pain Intervention(s): Limited activity within patient's tolerance     Extremity/Trunk Assessment Upper Extremity  Assessment Upper Extremity Assessment: Overall WFL for tasks assessed   Lower Extremity Assessment Lower Extremity Assessment: Defer to PT evaluation   Cervical / Trunk Assessment Cervical / Trunk Assessment: Kyphotic   Communication Communication Communication: No apparent difficulties    Cognition Arousal: Alert Behavior During Therapy: WFL for tasks assessed/performed Overall Cognitive Status: Within Functional Limits for tasks assessed                                       General Comments       Exercises     Shoulder Instructions      Home Living Family/patient expects to be discharged to:: Private residence Living Arrangements: Children Available Help at Discharge: Family Type of Home: House Home Access: Level entry     Home Layout: One level     Bathroom Shower/Tub: Producer, television/film/video: Handicapped height     Home Equipment: Rollator (4 wheels);Shower seat;Cane - single point          Prior Functioning/Environment Prior Level of Function : Independent/Modified Independent             Mobility Comments: does not go out of the home often but loves to go out onto the pourch ADLs Comments: completed sponge baths        OT Problem List: Pain;Decreased knowledge of use of DME or AE;Cardiopulmonary status limiting activity;Decreased activity tolerance;Decreased range of motion      OT Treatment/Interventions: Self-care/ADL training;Therapeutic exercise;DME and/or AE instruction;Energy conservation;Therapeutic activities;Patient/family education;Balance training    OT Goals(Current goals can be found in the care plan section) Acute Rehab OT Goals Patient Stated Goal: to be able to do OP cardiac rehab OT Goal Formulation: With patient Time For Goal Achievement: 11/08/22 Potential to Achieve Goals: Good  OT Frequency: Min 1X/week    Co-evaluation PT/OT/SLP Co-Evaluation/Treatment: Yes Reason for Co-Treatment: Complexity of the patient's impairments (multi-system involvement)   OT goals addressed during session: ADL's and self-care      AM-PAC OT "6 Clicks" Daily Activity     Outcome Measure Help from another person eating meals?: None Help from another person taking care of personal grooming?: A  Little Help from another person toileting, which includes using toliet, bedpan, or urinal?: A Little Help from another person bathing (including washing, rinsing, drying)?: A Little Help from another person to put on and taking off regular upper body clothing?: None Help from another person to put on and taking off regular lower body clothing?: A Little 6 Click Score: 20   End of Session Equipment Utilized During Treatment: Gait belt;Rolling walker (2 wheels) Nurse Communication: Mobility status  Activity Tolerance: Patient tolerated treatment well Patient left: in chair;with call bell/phone within reach;with family/visitor present (dauhgter)  OT Visit Diagnosis: Unsteadiness on feet (R26.81);Pain;Muscle weakness (generalized) (M62.81) Pain - Right/Left:  (abdomen)                Time: 4098-1191 OT Time Calculation (min): 17 min Charges:  OT General Charges $OT Visit: 1 Visit OT Evaluation $OT Eval Low Complexity: 1 Low OT Treatments $Self Care/Home Management : 8-22 mins  Presley Raddle OTR/L  Acute Rehab Services  [phone] office number   Alphia Moh 10/25/2022, 1:17 PM

## 2022-10-26 DIAGNOSIS — N184 Chronic kidney disease, stage 4 (severe): Secondary | ICD-10-CM | POA: Diagnosis not present

## 2022-10-26 DIAGNOSIS — I5189 Other ill-defined heart diseases: Secondary | ICD-10-CM | POA: Diagnosis not present

## 2022-10-26 DIAGNOSIS — I10 Essential (primary) hypertension: Secondary | ICD-10-CM | POA: Diagnosis not present

## 2022-10-26 DIAGNOSIS — I5033 Acute on chronic diastolic (congestive) heart failure: Secondary | ICD-10-CM | POA: Diagnosis not present

## 2022-10-26 DIAGNOSIS — K810 Acute cholecystitis: Secondary | ICD-10-CM | POA: Diagnosis not present

## 2022-10-26 DIAGNOSIS — I160 Hypertensive urgency: Secondary | ICD-10-CM | POA: Diagnosis not present

## 2022-10-26 LAB — BASIC METABOLIC PANEL
Anion gap: 11 (ref 5–15)
BUN: 53 mg/dL — ABNORMAL HIGH (ref 8–23)
CO2: 21 mmol/L — ABNORMAL LOW (ref 22–32)
Calcium: 8.5 mg/dL — ABNORMAL LOW (ref 8.9–10.3)
Chloride: 105 mmol/L (ref 98–111)
Creatinine, Ser: 2.77 mg/dL — ABNORMAL HIGH (ref 0.44–1.00)
GFR, Estimated: 16 mL/min — ABNORMAL LOW (ref >60)
Glucose, Bld: 187 mg/dL — ABNORMAL HIGH (ref 70–99)
Potassium: 4.6 mmol/L (ref 3.5–5.1)
Sodium: 137 mmol/L (ref 135–145)

## 2022-10-26 LAB — GLUCOSE, CAPILLARY
Glucose-Capillary: 143 mg/dL — ABNORMAL HIGH (ref 70–99)
Glucose-Capillary: 157 mg/dL — ABNORMAL HIGH (ref 70–99)
Glucose-Capillary: 204 mg/dL — ABNORMAL HIGH (ref 70–99)
Glucose-Capillary: 209 mg/dL — ABNORMAL HIGH (ref 70–99)

## 2022-10-26 LAB — CULTURE, BLOOD (ROUTINE X 2)
Culture: NO GROWTH
Culture: NO GROWTH
Special Requests: ADEQUATE

## 2022-10-26 MED ORDER — TRAMADOL HCL 50 MG PO TABS: 50.0000 mg | ORAL_TABLET | Freq: Two times a day (BID) | ORAL | Status: DC | PRN

## 2022-10-26 MED ORDER — ISOSORB DINITRATE-HYDRALAZINE 20-37.5 MG PO TABS
1.0000 | ORAL_TABLET | Freq: Three times a day (TID) | ORAL | Status: DC
  Administered 2022-10-26: 1 via ORAL
  Filled 2022-10-26 (×3): qty 1

## 2022-10-26 NOTE — Plan of Care (Signed)
  Problem: Coping: Goal: Ability to adjust to condition or change in health will improve Outcome: Progressing   Problem: Skin Integrity: Goal: Risk for impaired skin integrity will decrease Outcome: Progressing   Problem: Education: Goal: Knowledge of General Education information will improve Description: Including pain rating scale, medication(s)/side effects and non-pharmacologic comfort measures Outcome: Progressing   Problem: Health Behavior/Discharge Planning: Goal: Ability to manage health-related needs will improve Outcome: Progressing   Problem: Clinical Measurements: Goal: Ability to maintain clinical measurements within normal limits will improve Outcome: Progressing Goal: Will remain free from infection Outcome: Progressing Goal: Diagnostic test results will improve Outcome: Progressing Goal: Cardiovascular complication will be avoided Outcome: Progressing   Problem: Activity: Goal: Risk for activity intolerance will decrease Outcome: Progressing   Problem: Nutrition: Goal: Adequate nutrition will be maintained Outcome: Progressing   Problem: Coping: Goal: Level of anxiety will decrease Outcome: Progressing   Problem: Pain Managment: Goal: General experience of comfort will improve Outcome: Progressing   Problem: Safety: Goal: Ability to remain free from injury will improve Outcome: Progressing   Problem: Skin Integrity: Goal: Risk for impaired skin integrity will decrease Outcome: Progressing

## 2022-10-26 NOTE — Discharge Instructions (Signed)

## 2022-10-26 NOTE — Progress Notes (Signed)
Connie Swanson to be D/C'd  per MD order.  Discussed with the patient and all questions fully answered.  VSS, Skin clean, dry and intact without evidence of skin break down, no evidence of skin tears noted.  IV catheter discontinued intact. Site without signs and symptoms of complications. Dressing and pressure applied.  An After Visit Summary was printed and given to the patient.   D/c education completed with patient/family including follow up instructions, medication list, d/c activities limitations if indicated, with other d/c instructions as indicated by MD - patient able to verbalize understanding, all questions fully answered.   Patient instructed to return to ED, call 911, or call MD for any changes in condition.   Patient to be escorted via WC, and D/C home via private auto.

## 2022-10-26 NOTE — Progress Notes (Signed)
2 Days Post-Op  Subjective: Feeling well today.  Not taking any pain medications.  Slept great overnight.  Eating well.   ROS: See above, otherwise other systems negative  Objective: Vital signs in last 24 hours: Temp:  [97.5 F (36.4 C)-97.7 F (36.5 C)] 97.7 F (36.5 C) (10/18 0806) Pulse Rate:  [64-77] 65 (10/18 0806) Resp:  [16-20] 16 (10/18 0806) BP: (143-197)/(64-80) 183/72 (10/18 0806) SpO2:  [94 %-98 %] 97 % (10/18 0806) Weight:  [157.2 kg] 157.2 kg (10/18 0517) Last BM Date : 10/19/22  Intake/Output from previous day: 10/17 0701 - 10/18 0700 In: 480 [P.O.:480] Out: -  Intake/Output this shift: No intake/output data recorded.  PE: Gen: elderly female, NAD Resp: equal chest rise Abd: soft, non-distended, appropriately tender, port sites clean and dry with dermabond intact  Lab Results:  Recent Labs    10/24/22 0659 10/25/22 0702  WBC 9.7 8.9  HGB 10.5* 10.9*  HCT 32.8* 33.2*  PLT 228 233   BMET Recent Labs    10/24/22 0659 10/25/22 0702  NA 135 139  K 4.6 5.5*  CL 102 107  CO2 21* 20*  GLUCOSE 167* 169*  BUN 53* 56*  CREATININE 3.37* 3.01*  CALCIUM 8.3* 8.7*   PT/INR No results for input(s): "LABPROT", "INR" in the last 72 hours. CMP     Component Value Date/Time   NA 139 10/25/2022 0702   NA 139 05/17/2022 1413   K 5.5 (H) 10/25/2022 0702   CL 107 10/25/2022 0702   CO2 20 (L) 10/25/2022 0702   GLUCOSE 169 (H) 10/25/2022 0702   BUN 56 (H) 10/25/2022 0702   BUN 29 (H) 05/17/2022 1413   CREATININE 3.01 (H) 10/25/2022 0702   CALCIUM 8.7 (L) 10/25/2022 0702   PROT 6.0 (L) 10/25/2022 0702   ALBUMIN 2.1 (L) 10/25/2022 0702   AST 107 (H) 10/25/2022 0702   ALT 213 (H) 10/25/2022 0702   ALKPHOS 407 (H) 10/25/2022 0702   BILITOT 0.8 10/25/2022 0702   GFRNONAA 15 (L) 10/25/2022 0702   Lipase     Component Value Date/Time   LIPASE 35 10/21/2022 0216    Studies/Results: No results found.  Anti-infectives: Anti-infectives (From  admission, onward)    Start     Dose/Rate Route Frequency Ordered Stop   10/22/22 1000  cefTRIAXone (ROCEPHIN) 2 g in sodium chloride 0.9 % 100 mL IVPB  Status:  Discontinued        2 g 200 mL/hr over 30 Minutes Intravenous Every 24 hours 10/21/22 0938 10/25/22 1730   10/22/22 0800  metroNIDAZOLE (FLAGYL) IVPB 500 mg  Status:  Discontinued        500 mg 100 mL/hr over 60 Minutes Intravenous Every 12 hours 10/22/22 0717 10/25/22 1730   10/21/22 0830  cefTRIAXone (ROCEPHIN) 2 g in sodium chloride 0.9 % 100 mL IVPB        2 g 200 mL/hr over 30 Minutes Intravenous  Once 10/21/22 0828 10/21/22 0933   10/21/22 0545  metroNIDAZOLE (FLAGYL) IVPB 500 mg        500 mg 100 mL/hr over 60 Minutes Intravenous  Once 10/21/22 0541 10/21/22 0719   10/21/22 0400  cefTRIAXone (ROCEPHIN) 1 g in sodium chloride 0.9 % 100 mL IVPB        1 g 200 mL/hr over 30 Minutes Intravenous  Once 10/21/22 0358 10/21/22 0549       Assessment/Plan  82 y/o F w/ a hx of HTN, HLD, HF w/ preserved  EF, CAD, heart block s/p pacer, and DM who presented with abdominal pain and had imaging c/f cholecystitis   Cholecystitis s/p lap chole 10/16 - LFTs downtrending and abdominal pain is well controlled - Regular diet - No indication for ongoing antibiotics related to her GB - Follow up with general surgery 2-3 weeks post-op - surgically stable for DC home from our standpoint when medically stable, d/w primary  Dispo: per primary    LOS: 5 days   Jeri Lager [facility] 10/26/2022, 8:36 AM Please see Amion for pager number during day hours 7:00am-4:30pm or 7:00am -11:30am on weekends

## 2022-10-26 NOTE — Discharge Summary (Signed)
M.D.   On: 10/23/2022 16:56   ECHOCARDIOGRAM COMPLETE  Result Date: 10/22/2022    ECHOCARDIOGRAM REPORT   Patient Name:   Connie Swanson Date of Exam: 10/22/2022 Medical Rec #:  [id number]   Height:       61.0 in Accession #:    [id number]  Weight:       198.0 lb Date of Birth:  September 29, 1940   BSA:          1.881 m Patient Age:    82 years    BP:           134/55 mmHg Patient Gender: F           HR:           89 bpm. Exam  Location:  Inpatient Procedure: 2D Echo, Color Doppler and Cardiac Doppler Indications:    acute diastolic chf  History:        Patient has prior history of Echocardiogram examinations, most                 recent 10/19/2021. Pacemaker, chronic kidney disease,                 Arrythmias:LBBB; Risk Factors:Diabetes, Hypertension and                 Dyslipidemia.  Sonographer:    Delcie Roch RDCS Referring Phys: [id number] RONDELL A SMITH  Sonographer Comments: Technically difficult study due to poor echo windows. Image acquisition challenging due to patient body habitus and Image acquisition challenging due to respiratory motion. IMPRESSIONS  1. Left ventricular ejection fraction, by estimation, is 70 to 75%. The left ventricle has hyperdynamic function. The left ventricle has no regional wall motion abnormalities. There is moderate concentric left ventricular hypertrophy. Left ventricular diastolic parameters are indeterminate.  2. Right ventricular systolic function is normal. The right ventricular size is mildly enlarged. There is moderately elevated pulmonary artery systolic pressure. The estimated right ventricular systolic pressure is 54.0 mmHg.  3. Left atrial size was mild to moderately dilated.  4. Right atrial size was mildly dilated.  5. The mitral valve is degenerative. No evidence of mitral valve regurgitation. No evidence of mitral stenosis. Moderate mitral annular calcification.  6. The aortic valve is tricuspid. There is mild calcification of the aortic valve. Aortic valve regurgitation is not visualized. Aortic valve sclerosis/calcification is present, without any evidence of aortic stenosis. Aortic valve area, by VTI measures  2.80 cm. Aortic valve mean gradient measures 8.0 mmHg. Aortic valve Vmax measures 2.11 m/s.  7. The inferior vena cava is normal in size with greater than 50% respiratory variability, suggesting right atrial pressure of 3 mmHg. FINDINGS  Left Ventricle: Left ventricular  ejection fraction, by estimation, is 70 to 75%. The left ventricle has hyperdynamic function. The left ventricle has no regional wall motion abnormalities. Definity contrast agent was given IV to delineate the left ventricular endocardial borders. The left ventricular internal cavity size was normal in size. There is moderate concentric left ventricular hypertrophy. Left ventricular diastolic parameters are indeterminate. Right Ventricle: The right ventricular size is mildly enlarged. No increase in right ventricular wall thickness. Right ventricular systolic function is normal. There is moderately elevated pulmonary artery systolic pressure. The tricuspid regurgitant velocity is 3.57 m/s, and with an assumed right atrial pressure of 3 mmHg, the estimated right ventricular systolic pressure is 54.0 mmHg. Left Atrium: Left atrial size was mild to moderately dilated. Right Atrium: Right atrial size was mildly dilated.  Physician Discharge Summary   Patient: Connie Swanson MRN: [id number] DOB: March 17, 1940  Admit date:     10/21/2022  Discharge date: 10/26/22  Discharge Physician: Coralie Keens   PCP: Gaspar Garbe, MD   Recommendations at discharge:    Continue holding Entresto due to hyperkalemia, and recovering GFR. Follow up renal function and electrolytes in 7 days.  Follow up with Dr Wylene Simmer in 7 to 10 days. Follow up with surgery and cardiology as scheduled.   Discharge Diagnoses: Principal Problem:   Acute cholecystitis Active Problems:   Acute on chronic diastolic CHF (congestive heart failure) (HCC)   CKD (chronic kidney disease), stage IV (HCC)   Hypertensive urgency   CAD (coronary artery disease)   Type 2 diabetes mellitus with hyperlipidemia (HCC)   Obesity, class 3  Resolved Problems:   * No resolved hospital problems. [facility] Course: Connie Swanson was admitted to the hospital with the working diagnosis of acute cholangitis with cholelithiasis.   82/F with history of chronic diastolic CHF, CAD, complete heart block sp pacemake, type 2 diabetes mellitus, hypertension, dyslipidemia who presented with abdominal pain, right upper quadrant. It occurred about 4 hrs after eating dinner. Because pain was persistent and intense she came to the ED for further evaluation. On her initial physical examination her blood pressure was 183/81, HR 63, RR 19 and 02 saturation 100%, lungs with no wheezing or rales, heart with S1 and S2 present and regular with no gallops, or rubs, abdomen not distended and not tender to palpation, no lower extremity edema.   Na 138, K 4,2 CL 107 bicarbonate 22, glucose 228 bun 25 cr 1,9 BNP 399  High sensitive troponin 11  Lactic acid 2,3  Wbc 13.3 hgb 11.4 plt 276  Urine analysis SG 1,008, protein 100, glucose >500, negative for Hgb and negative for leukocytes.   Chest radiograph with left rotation, hyperinflation, increased lung markings bilaterally  with no frank infiltrate, no effusions, pacemaker in place with one lead in the right atrium and one lead in the right ventricle.   EKG 76 bpm, left axis deviation, left bundle branch block, qtc 522, sinus with 1st degree AV block, ventricular paced rhythm with no significant ST segment   10/14 a.m. worsening leukocytosis, kidney function, significantly elevated LFTs and bilirubin. HIDA scan concerning for biliary obstruction  10/15, worsening kidney function, LFTs remain elevated. MRCP with cholelithiasis with increased gallbladder wall thickening and pericholecystic fluid. No choledocholithiasis. Extra hepatic biliary system with dilatation with the common hepatic duct measuring up to 11 mm in diameter.   10/16 Laparoscopic cholecystectomy with intra operative cholangiography.   10/17 patient recovering well post operative, renal function more stable.  Plan to follow up as outpatient.   Assessment and Plan: * Acute cholecystitis Cholangitis (complicated with sepsis present on admission). Sepsis now resolved.   10/15 ERCP with cholelithiasis with increased gallbladder wall thickening and pericholecystic fluid, suspicious for developing acute cholecystitis.  Mild extrahepatic bilary dilatation without evidence of choledocholithiasis.  Several small cystic lesions in the pancreas, largest measuring 1,7 cm in the pancreatic body. (Follow up in 2 years).   10.16 cholecystectomy (laparoscopic with intra operative cholangiography).    Post operative antibiotic therapy was discontinued.  No further abdominal pan, wbc is 8,9.  Patient will continue pain control with acetaminophen and will follow up as outpatient.  Resume bowel regimen.    Acute on chronic diastolic CHF (congestive heart failure) (HCC) Echocardiogram with preserved LV systolic function 70 to  Physician Discharge Summary   Patient: Connie Swanson MRN: [id number] DOB: March 17, 1940  Admit date:     10/21/2022  Discharge date: 10/26/22  Discharge Physician: Coralie Keens   PCP: Gaspar Garbe, MD   Recommendations at discharge:    Continue holding Entresto due to hyperkalemia, and recovering GFR. Follow up renal function and electrolytes in 7 days.  Follow up with Dr Wylene Simmer in 7 to 10 days. Follow up with surgery and cardiology as scheduled.   Discharge Diagnoses: Principal Problem:   Acute cholecystitis Active Problems:   Acute on chronic diastolic CHF (congestive heart failure) (HCC)   CKD (chronic kidney disease), stage IV (HCC)   Hypertensive urgency   CAD (coronary artery disease)   Type 2 diabetes mellitus with hyperlipidemia (HCC)   Obesity, class 3  Resolved Problems:   * No resolved hospital problems. [facility] Course: Connie Swanson was admitted to the hospital with the working diagnosis of acute cholangitis with cholelithiasis.   82/F with history of chronic diastolic CHF, CAD, complete heart block sp pacemake, type 2 diabetes mellitus, hypertension, dyslipidemia who presented with abdominal pain, right upper quadrant. It occurred about 4 hrs after eating dinner. Because pain was persistent and intense she came to the ED for further evaluation. On her initial physical examination her blood pressure was 183/81, HR 63, RR 19 and 02 saturation 100%, lungs with no wheezing or rales, heart with S1 and S2 present and regular with no gallops, or rubs, abdomen not distended and not tender to palpation, no lower extremity edema.   Na 138, K 4,2 CL 107 bicarbonate 22, glucose 228 bun 25 cr 1,9 BNP 399  High sensitive troponin 11  Lactic acid 2,3  Wbc 13.3 hgb 11.4 plt 276  Urine analysis SG 1,008, protein 100, glucose >500, negative for Hgb and negative for leukocytes.   Chest radiograph with left rotation, hyperinflation, increased lung markings bilaterally  with no frank infiltrate, no effusions, pacemaker in place with one lead in the right atrium and one lead in the right ventricle.   EKG 76 bpm, left axis deviation, left bundle branch block, qtc 522, sinus with 1st degree AV block, ventricular paced rhythm with no significant ST segment   10/14 a.m. worsening leukocytosis, kidney function, significantly elevated LFTs and bilirubin. HIDA scan concerning for biliary obstruction  10/15, worsening kidney function, LFTs remain elevated. MRCP with cholelithiasis with increased gallbladder wall thickening and pericholecystic fluid. No choledocholithiasis. Extra hepatic biliary system with dilatation with the common hepatic duct measuring up to 11 mm in diameter.   10/16 Laparoscopic cholecystectomy with intra operative cholangiography.   10/17 patient recovering well post operative, renal function more stable.  Plan to follow up as outpatient.   Assessment and Plan: * Acute cholecystitis Cholangitis (complicated with sepsis present on admission). Sepsis now resolved.   10/15 ERCP with cholelithiasis with increased gallbladder wall thickening and pericholecystic fluid, suspicious for developing acute cholecystitis.  Mild extrahepatic bilary dilatation without evidence of choledocholithiasis.  Several small cystic lesions in the pancreas, largest measuring 1,7 cm in the pancreatic body. (Follow up in 2 years).   10.16 cholecystectomy (laparoscopic with intra operative cholangiography).    Post operative antibiotic therapy was discontinued.  No further abdominal pan, wbc is 8,9.  Patient will continue pain control with acetaminophen and will follow up as outpatient.  Resume bowel regimen.    Acute on chronic diastolic CHF (congestive heart failure) (HCC) Echocardiogram with preserved LV systolic function 70 to  Physician Discharge Summary   Patient: Connie Swanson MRN: [id number] DOB: March 17, 1940  Admit date:     10/21/2022  Discharge date: 10/26/22  Discharge Physician: Coralie Keens   PCP: Gaspar Garbe, MD   Recommendations at discharge:    Continue holding Entresto due to hyperkalemia, and recovering GFR. Follow up renal function and electrolytes in 7 days.  Follow up with Dr Wylene Simmer in 7 to 10 days. Follow up with surgery and cardiology as scheduled.   Discharge Diagnoses: Principal Problem:   Acute cholecystitis Active Problems:   Acute on chronic diastolic CHF (congestive heart failure) (HCC)   CKD (chronic kidney disease), stage IV (HCC)   Hypertensive urgency   CAD (coronary artery disease)   Type 2 diabetes mellitus with hyperlipidemia (HCC)   Obesity, class 3  Resolved Problems:   * No resolved hospital problems. [facility] Course: Connie Swanson was admitted to the hospital with the working diagnosis of acute cholangitis with cholelithiasis.   82/F with history of chronic diastolic CHF, CAD, complete heart block sp pacemake, type 2 diabetes mellitus, hypertension, dyslipidemia who presented with abdominal pain, right upper quadrant. It occurred about 4 hrs after eating dinner. Because pain was persistent and intense she came to the ED for further evaluation. On her initial physical examination her blood pressure was 183/81, HR 63, RR 19 and 02 saturation 100%, lungs with no wheezing or rales, heart with S1 and S2 present and regular with no gallops, or rubs, abdomen not distended and not tender to palpation, no lower extremity edema.   Na 138, K 4,2 CL 107 bicarbonate 22, glucose 228 bun 25 cr 1,9 BNP 399  High sensitive troponin 11  Lactic acid 2,3  Wbc 13.3 hgb 11.4 plt 276  Urine analysis SG 1,008, protein 100, glucose >500, negative for Hgb and negative for leukocytes.   Chest radiograph with left rotation, hyperinflation, increased lung markings bilaterally  with no frank infiltrate, no effusions, pacemaker in place with one lead in the right atrium and one lead in the right ventricle.   EKG 76 bpm, left axis deviation, left bundle branch block, qtc 522, sinus with 1st degree AV block, ventricular paced rhythm with no significant ST segment   10/14 a.m. worsening leukocytosis, kidney function, significantly elevated LFTs and bilirubin. HIDA scan concerning for biliary obstruction  10/15, worsening kidney function, LFTs remain elevated. MRCP with cholelithiasis with increased gallbladder wall thickening and pericholecystic fluid. No choledocholithiasis. Extra hepatic biliary system with dilatation with the common hepatic duct measuring up to 11 mm in diameter.   10/16 Laparoscopic cholecystectomy with intra operative cholangiography.   10/17 patient recovering well post operative, renal function more stable.  Plan to follow up as outpatient.   Assessment and Plan: * Acute cholecystitis Cholangitis (complicated with sepsis present on admission). Sepsis now resolved.   10/15 ERCP with cholelithiasis with increased gallbladder wall thickening and pericholecystic fluid, suspicious for developing acute cholecystitis.  Mild extrahepatic bilary dilatation without evidence of choledocholithiasis.  Several small cystic lesions in the pancreas, largest measuring 1,7 cm in the pancreatic body. (Follow up in 2 years).   10.16 cholecystectomy (laparoscopic with intra operative cholangiography).    Post operative antibiotic therapy was discontinued.  No further abdominal pan, wbc is 8,9.  Patient will continue pain control with acetaminophen and will follow up as outpatient.  Resume bowel regimen.    Acute on chronic diastolic CHF (congestive heart failure) (HCC) Echocardiogram with preserved LV systolic function 70 to  M.D.   On: 10/23/2022 16:56   ECHOCARDIOGRAM COMPLETE  Result Date: 10/22/2022    ECHOCARDIOGRAM REPORT   Patient Name:   Connie Swanson Date of Exam: 10/22/2022 Medical Rec #:  [id number]   Height:       61.0 in Accession #:    [id number]  Weight:       198.0 lb Date of Birth:  September 29, 1940   BSA:          1.881 m Patient Age:    82 years    BP:           134/55 mmHg Patient Gender: F           HR:           89 bpm. Exam  Location:  Inpatient Procedure: 2D Echo, Color Doppler and Cardiac Doppler Indications:    acute diastolic chf  History:        Patient has prior history of Echocardiogram examinations, most                 recent 10/19/2021. Pacemaker, chronic kidney disease,                 Arrythmias:LBBB; Risk Factors:Diabetes, Hypertension and                 Dyslipidemia.  Sonographer:    Delcie Roch RDCS Referring Phys: [id number] RONDELL A SMITH  Sonographer Comments: Technically difficult study due to poor echo windows. Image acquisition challenging due to patient body habitus and Image acquisition challenging due to respiratory motion. IMPRESSIONS  1. Left ventricular ejection fraction, by estimation, is 70 to 75%. The left ventricle has hyperdynamic function. The left ventricle has no regional wall motion abnormalities. There is moderate concentric left ventricular hypertrophy. Left ventricular diastolic parameters are indeterminate.  2. Right ventricular systolic function is normal. The right ventricular size is mildly enlarged. There is moderately elevated pulmonary artery systolic pressure. The estimated right ventricular systolic pressure is 54.0 mmHg.  3. Left atrial size was mild to moderately dilated.  4. Right atrial size was mildly dilated.  5. The mitral valve is degenerative. No evidence of mitral valve regurgitation. No evidence of mitral stenosis. Moderate mitral annular calcification.  6. The aortic valve is tricuspid. There is mild calcification of the aortic valve. Aortic valve regurgitation is not visualized. Aortic valve sclerosis/calcification is present, without any evidence of aortic stenosis. Aortic valve area, by VTI measures  2.80 cm. Aortic valve mean gradient measures 8.0 mmHg. Aortic valve Vmax measures 2.11 m/s.  7. The inferior vena cava is normal in size with greater than 50% respiratory variability, suggesting right atrial pressure of 3 mmHg. FINDINGS  Left Ventricle: Left ventricular  ejection fraction, by estimation, is 70 to 75%. The left ventricle has hyperdynamic function. The left ventricle has no regional wall motion abnormalities. Definity contrast agent was given IV to delineate the left ventricular endocardial borders. The left ventricular internal cavity size was normal in size. There is moderate concentric left ventricular hypertrophy. Left ventricular diastolic parameters are indeterminate. Right Ventricle: The right ventricular size is mildly enlarged. No increase in right ventricular wall thickness. Right ventricular systolic function is normal. There is moderately elevated pulmonary artery systolic pressure. The tricuspid regurgitant velocity is 3.57 m/s, and with an assumed right atrial pressure of 3 mmHg, the estimated right ventricular systolic pressure is 54.0 mmHg. Left Atrium: Left atrial size was mild to moderately dilated. Right Atrium: Right atrial size was mildly dilated.  M.D.   On: 10/23/2022 16:56   ECHOCARDIOGRAM COMPLETE  Result Date: 10/22/2022    ECHOCARDIOGRAM REPORT   Patient Name:   Connie Swanson Date of Exam: 10/22/2022 Medical Rec #:  [id number]   Height:       61.0 in Accession #:    [id number]  Weight:       198.0 lb Date of Birth:  September 29, 1940   BSA:          1.881 m Patient Age:    82 years    BP:           134/55 mmHg Patient Gender: F           HR:           89 bpm. Exam  Location:  Inpatient Procedure: 2D Echo, Color Doppler and Cardiac Doppler Indications:    acute diastolic chf  History:        Patient has prior history of Echocardiogram examinations, most                 recent 10/19/2021. Pacemaker, chronic kidney disease,                 Arrythmias:LBBB; Risk Factors:Diabetes, Hypertension and                 Dyslipidemia.  Sonographer:    Delcie Roch RDCS Referring Phys: [id number] RONDELL A SMITH  Sonographer Comments: Technically difficult study due to poor echo windows. Image acquisition challenging due to patient body habitus and Image acquisition challenging due to respiratory motion. IMPRESSIONS  1. Left ventricular ejection fraction, by estimation, is 70 to 75%. The left ventricle has hyperdynamic function. The left ventricle has no regional wall motion abnormalities. There is moderate concentric left ventricular hypertrophy. Left ventricular diastolic parameters are indeterminate.  2. Right ventricular systolic function is normal. The right ventricular size is mildly enlarged. There is moderately elevated pulmonary artery systolic pressure. The estimated right ventricular systolic pressure is 54.0 mmHg.  3. Left atrial size was mild to moderately dilated.  4. Right atrial size was mildly dilated.  5. The mitral valve is degenerative. No evidence of mitral valve regurgitation. No evidence of mitral stenosis. Moderate mitral annular calcification.  6. The aortic valve is tricuspid. There is mild calcification of the aortic valve. Aortic valve regurgitation is not visualized. Aortic valve sclerosis/calcification is present, without any evidence of aortic stenosis. Aortic valve area, by VTI measures  2.80 cm. Aortic valve mean gradient measures 8.0 mmHg. Aortic valve Vmax measures 2.11 m/s.  7. The inferior vena cava is normal in size with greater than 50% respiratory variability, suggesting right atrial pressure of 3 mmHg. FINDINGS  Left Ventricle: Left ventricular  ejection fraction, by estimation, is 70 to 75%. The left ventricle has hyperdynamic function. The left ventricle has no regional wall motion abnormalities. Definity contrast agent was given IV to delineate the left ventricular endocardial borders. The left ventricular internal cavity size was normal in size. There is moderate concentric left ventricular hypertrophy. Left ventricular diastolic parameters are indeterminate. Right Ventricle: The right ventricular size is mildly enlarged. No increase in right ventricular wall thickness. Right ventricular systolic function is normal. There is moderately elevated pulmonary artery systolic pressure. The tricuspid regurgitant velocity is 3.57 m/s, and with an assumed right atrial pressure of 3 mmHg, the estimated right ventricular systolic pressure is 54.0 mmHg. Left Atrium: Left atrial size was mild to moderately dilated. Right Atrium: Right atrial size was mildly dilated.  M.D.   On: 10/23/2022 16:56   ECHOCARDIOGRAM COMPLETE  Result Date: 10/22/2022    ECHOCARDIOGRAM REPORT   Patient Name:   Connie Swanson Date of Exam: 10/22/2022 Medical Rec #:  [id number]   Height:       61.0 in Accession #:    [id number]  Weight:       198.0 lb Date of Birth:  September 29, 1940   BSA:          1.881 m Patient Age:    82 years    BP:           134/55 mmHg Patient Gender: F           HR:           89 bpm. Exam  Location:  Inpatient Procedure: 2D Echo, Color Doppler and Cardiac Doppler Indications:    acute diastolic chf  History:        Patient has prior history of Echocardiogram examinations, most                 recent 10/19/2021. Pacemaker, chronic kidney disease,                 Arrythmias:LBBB; Risk Factors:Diabetes, Hypertension and                 Dyslipidemia.  Sonographer:    Delcie Roch RDCS Referring Phys: [id number] RONDELL A SMITH  Sonographer Comments: Technically difficult study due to poor echo windows. Image acquisition challenging due to patient body habitus and Image acquisition challenging due to respiratory motion. IMPRESSIONS  1. Left ventricular ejection fraction, by estimation, is 70 to 75%. The left ventricle has hyperdynamic function. The left ventricle has no regional wall motion abnormalities. There is moderate concentric left ventricular hypertrophy. Left ventricular diastolic parameters are indeterminate.  2. Right ventricular systolic function is normal. The right ventricular size is mildly enlarged. There is moderately elevated pulmonary artery systolic pressure. The estimated right ventricular systolic pressure is 54.0 mmHg.  3. Left atrial size was mild to moderately dilated.  4. Right atrial size was mildly dilated.  5. The mitral valve is degenerative. No evidence of mitral valve regurgitation. No evidence of mitral stenosis. Moderate mitral annular calcification.  6. The aortic valve is tricuspid. There is mild calcification of the aortic valve. Aortic valve regurgitation is not visualized. Aortic valve sclerosis/calcification is present, without any evidence of aortic stenosis. Aortic valve area, by VTI measures  2.80 cm. Aortic valve mean gradient measures 8.0 mmHg. Aortic valve Vmax measures 2.11 m/s.  7. The inferior vena cava is normal in size with greater than 50% respiratory variability, suggesting right atrial pressure of 3 mmHg. FINDINGS  Left Ventricle: Left ventricular  ejection fraction, by estimation, is 70 to 75%. The left ventricle has hyperdynamic function. The left ventricle has no regional wall motion abnormalities. Definity contrast agent was given IV to delineate the left ventricular endocardial borders. The left ventricular internal cavity size was normal in size. There is moderate concentric left ventricular hypertrophy. Left ventricular diastolic parameters are indeterminate. Right Ventricle: The right ventricular size is mildly enlarged. No increase in right ventricular wall thickness. Right ventricular systolic function is normal. There is moderately elevated pulmonary artery systolic pressure. The tricuspid regurgitant velocity is 3.57 m/s, and with an assumed right atrial pressure of 3 mmHg, the estimated right ventricular systolic pressure is 54.0 mmHg. Left Atrium: Left atrial size was mild to moderately dilated. Right Atrium: Right atrial size was mildly dilated.  M.D.   On: 10/23/2022 16:56   ECHOCARDIOGRAM COMPLETE  Result Date: 10/22/2022    ECHOCARDIOGRAM REPORT   Patient Name:   Connie Swanson Date of Exam: 10/22/2022 Medical Rec #:  [id number]   Height:       61.0 in Accession #:    [id number]  Weight:       198.0 lb Date of Birth:  September 29, 1940   BSA:          1.881 m Patient Age:    82 years    BP:           134/55 mmHg Patient Gender: F           HR:           89 bpm. Exam  Location:  Inpatient Procedure: 2D Echo, Color Doppler and Cardiac Doppler Indications:    acute diastolic chf  History:        Patient has prior history of Echocardiogram examinations, most                 recent 10/19/2021. Pacemaker, chronic kidney disease,                 Arrythmias:LBBB; Risk Factors:Diabetes, Hypertension and                 Dyslipidemia.  Sonographer:    Delcie Roch RDCS Referring Phys: [id number] RONDELL A SMITH  Sonographer Comments: Technically difficult study due to poor echo windows. Image acquisition challenging due to patient body habitus and Image acquisition challenging due to respiratory motion. IMPRESSIONS  1. Left ventricular ejection fraction, by estimation, is 70 to 75%. The left ventricle has hyperdynamic function. The left ventricle has no regional wall motion abnormalities. There is moderate concentric left ventricular hypertrophy. Left ventricular diastolic parameters are indeterminate.  2. Right ventricular systolic function is normal. The right ventricular size is mildly enlarged. There is moderately elevated pulmonary artery systolic pressure. The estimated right ventricular systolic pressure is 54.0 mmHg.  3. Left atrial size was mild to moderately dilated.  4. Right atrial size was mildly dilated.  5. The mitral valve is degenerative. No evidence of mitral valve regurgitation. No evidence of mitral stenosis. Moderate mitral annular calcification.  6. The aortic valve is tricuspid. There is mild calcification of the aortic valve. Aortic valve regurgitation is not visualized. Aortic valve sclerosis/calcification is present, without any evidence of aortic stenosis. Aortic valve area, by VTI measures  2.80 cm. Aortic valve mean gradient measures 8.0 mmHg. Aortic valve Vmax measures 2.11 m/s.  7. The inferior vena cava is normal in size with greater than 50% respiratory variability, suggesting right atrial pressure of 3 mmHg. FINDINGS  Left Ventricle: Left ventricular  ejection fraction, by estimation, is 70 to 75%. The left ventricle has hyperdynamic function. The left ventricle has no regional wall motion abnormalities. Definity contrast agent was given IV to delineate the left ventricular endocardial borders. The left ventricular internal cavity size was normal in size. There is moderate concentric left ventricular hypertrophy. Left ventricular diastolic parameters are indeterminate. Right Ventricle: The right ventricular size is mildly enlarged. No increase in right ventricular wall thickness. Right ventricular systolic function is normal. There is moderately elevated pulmonary artery systolic pressure. The tricuspid regurgitant velocity is 3.57 m/s, and with an assumed right atrial pressure of 3 mmHg, the estimated right ventricular systolic pressure is 54.0 mmHg. Left Atrium: Left atrial size was mild to moderately dilated. Right Atrium: Right atrial size was mildly dilated.  Physician Discharge Summary   Patient: Connie Swanson MRN: [id number] DOB: March 17, 1940  Admit date:     10/21/2022  Discharge date: 10/26/22  Discharge Physician: Coralie Keens   PCP: Gaspar Garbe, MD   Recommendations at discharge:    Continue holding Entresto due to hyperkalemia, and recovering GFR. Follow up renal function and electrolytes in 7 days.  Follow up with Dr Wylene Simmer in 7 to 10 days. Follow up with surgery and cardiology as scheduled.   Discharge Diagnoses: Principal Problem:   Acute cholecystitis Active Problems:   Acute on chronic diastolic CHF (congestive heart failure) (HCC)   CKD (chronic kidney disease), stage IV (HCC)   Hypertensive urgency   CAD (coronary artery disease)   Type 2 diabetes mellitus with hyperlipidemia (HCC)   Obesity, class 3  Resolved Problems:   * No resolved hospital problems. [facility] Course: Connie Swanson was admitted to the hospital with the working diagnosis of acute cholangitis with cholelithiasis.   82/F with history of chronic diastolic CHF, CAD, complete heart block sp pacemake, type 2 diabetes mellitus, hypertension, dyslipidemia who presented with abdominal pain, right upper quadrant. It occurred about 4 hrs after eating dinner. Because pain was persistent and intense she came to the ED for further evaluation. On her initial physical examination her blood pressure was 183/81, HR 63, RR 19 and 02 saturation 100%, lungs with no wheezing or rales, heart with S1 and S2 present and regular with no gallops, or rubs, abdomen not distended and not tender to palpation, no lower extremity edema.   Na 138, K 4,2 CL 107 bicarbonate 22, glucose 228 bun 25 cr 1,9 BNP 399  High sensitive troponin 11  Lactic acid 2,3  Wbc 13.3 hgb 11.4 plt 276  Urine analysis SG 1,008, protein 100, glucose >500, negative for Hgb and negative for leukocytes.   Chest radiograph with left rotation, hyperinflation, increased lung markings bilaterally  with no frank infiltrate, no effusions, pacemaker in place with one lead in the right atrium and one lead in the right ventricle.   EKG 76 bpm, left axis deviation, left bundle branch block, qtc 522, sinus with 1st degree AV block, ventricular paced rhythm with no significant ST segment   10/14 a.m. worsening leukocytosis, kidney function, significantly elevated LFTs and bilirubin. HIDA scan concerning for biliary obstruction  10/15, worsening kidney function, LFTs remain elevated. MRCP with cholelithiasis with increased gallbladder wall thickening and pericholecystic fluid. No choledocholithiasis. Extra hepatic biliary system with dilatation with the common hepatic duct measuring up to 11 mm in diameter.   10/16 Laparoscopic cholecystectomy with intra operative cholangiography.   10/17 patient recovering well post operative, renal function more stable.  Plan to follow up as outpatient.   Assessment and Plan: * Acute cholecystitis Cholangitis (complicated with sepsis present on admission). Sepsis now resolved.   10/15 ERCP with cholelithiasis with increased gallbladder wall thickening and pericholecystic fluid, suspicious for developing acute cholecystitis.  Mild extrahepatic bilary dilatation without evidence of choledocholithiasis.  Several small cystic lesions in the pancreas, largest measuring 1,7 cm in the pancreatic body. (Follow up in 2 years).   10.16 cholecystectomy (laparoscopic with intra operative cholangiography).    Post operative antibiotic therapy was discontinued.  No further abdominal pan, wbc is 8,9.  Patient will continue pain control with acetaminophen and will follow up as outpatient.  Resume bowel regimen.    Acute on chronic diastolic CHF (congestive heart failure) (HCC) Echocardiogram with preserved LV systolic function 70 to

## 2022-10-26 NOTE — TOC Transition Note (Signed)
Transition of Care [facility]) - CM/SW Discharge Note   Patient Details  Name: Connie Swanson MRN: [id number] Date of Birth: Dec 25, 1940  Transition of Care [facility]) CM/SW Contact:  Tom-Johnson, Hershal Coria, RN Phone Number: 10/26/2022, 1:59 PM   Clinical Narrative:    Patient is scheduled for discharge today.  Readmission Risk Assessment done. Home health info, hospital f/u and discharge instructions on AVS. Daughter, Waynetta Sandy to transport at discharge.  No further TOC needs noted.        Final next level of care: Home w Home Health Services Barriers to Discharge: Barriers Resolved   Patient Goals and CMS Choice      Discharge Placement                  Patient to be transferred to facility by: Daughter Name of family member notified: Beth    Discharge Plan and Services Additional resources added to the After Visit Summary for     Discharge Planning Services: CM Consult Post Acute Care Choice: NA          DME Arranged: N/A         HH Arranged: NA          Social Determinants of Health (SDOH) Interventions SDOH Screenings   Food Insecurity: No Food Insecurity (10/21/2022)  Housing: Low Risk  (10/21/2022)  Transportation Needs: No Transportation Needs (10/21/2022)  Utilities: Not At Risk (10/21/2022)  Tobacco Use: Low Risk  (10/24/2022)     Readmission Risk Interventions    10/26/2022    1:57 PM  Readmission Risk Prevention Plan  Transportation Screening Complete  PCP or Specialist Appt within 5-7 Days Complete  Home Care Screening Complete  Medication Review (RN CM) Referral to Pharmacy

## 2022-10-26 NOTE — Plan of Care (Signed)
  Problem: Education: Goal: Knowledge of cardiac device and self-care will improve Outcome: Adequate for Discharge Goal: Ability to safely manage health related needs after discharge will improve Outcome: Adequate for Discharge Goal: Individualized Educational Video(s) Outcome: Adequate for Discharge   Problem: Cardiac: Goal: Ability to achieve and maintain adequate cardiopulmonary perfusion will improve Outcome: Adequate for Discharge   Problem: Education: Goal: Ability to describe self-care measures that may prevent or decrease complications (Diabetes Survival Skills Education) will improve Outcome: Adequate for Discharge Goal: Individualized Educational Video(s) Outcome: Adequate for Discharge   Problem: Coping: Goal: Ability to adjust to condition or change in health will improve Outcome: Adequate for Discharge   Problem: Fluid Volume: Goal: Ability to maintain a balanced intake and output will improve Outcome: Adequate for Discharge   Problem: Health Behavior/Discharge Planning: Goal: Ability to identify and utilize available resources and services will improve Outcome: Adequate for Discharge Goal: Ability to manage health-related needs will improve Outcome: Adequate for Discharge   Problem: Metabolic: Goal: Ability to maintain appropriate glucose levels will improve Outcome: Adequate for Discharge   Problem: Nutritional: Goal: Maintenance of adequate nutrition will improve Outcome: Adequate for Discharge Goal: Progress toward achieving an optimal weight will improve Outcome: Adequate for Discharge   Problem: Skin Integrity: Goal: Risk for impaired skin integrity will decrease Outcome: Adequate for Discharge   Problem: Tissue Perfusion: Goal: Adequacy of tissue perfusion will improve Outcome: Adequate for Discharge   Problem: Education: Goal: Knowledge of General Education information will improve Description: Including pain rating scale, medication(s)/side  effects and non-pharmacologic comfort measures Outcome: Adequate for Discharge   Problem: Health Behavior/Discharge Planning: Goal: Ability to manage health-related needs will improve Outcome: Adequate for Discharge   Problem: Clinical Measurements: Goal: Ability to maintain clinical measurements within normal limits will improve Outcome: Adequate for Discharge Goal: Will remain free from infection Outcome: Adequate for Discharge Goal: Diagnostic test results will improve Outcome: Adequate for Discharge Goal: Respiratory complications will improve Outcome: Adequate for Discharge Goal: Cardiovascular complication will be avoided Outcome: Adequate for Discharge   Problem: Activity: Goal: Risk for activity intolerance will decrease Outcome: Adequate for Discharge   Problem: Nutrition: Goal: Adequate nutrition will be maintained Outcome: Adequate for Discharge   Problem: Coping: Goal: Level of anxiety will decrease Outcome: Adequate for Discharge   Problem: Elimination: Goal: Will not experience complications related to bowel motility Outcome: Adequate for Discharge Goal: Will not experience complications related to urinary retention Outcome: Adequate for Discharge   Problem: Pain Managment: Goal: General experience of comfort will improve Outcome: Adequate for Discharge   Problem: Safety: Goal: Ability to remain free from injury will improve Outcome: Adequate for Discharge   Problem: Skin Integrity: Goal: Risk for impaired skin integrity will decrease Outcome: Adequate for Discharge   Problem: Education: Goal: Understanding of post-operative needs will improve Outcome: Adequate for Discharge Goal: Individualized Educational Video(s) Outcome: Adequate for Discharge   Problem: Clinical Measurements: Goal: Postoperative complications will be avoided or minimized Outcome: Adequate for Discharge   Problem: Respiratory: Goal: Will regain and/or maintain adequate  ventilation Outcome: Adequate for Discharge

## 2022-10-26 NOTE — Progress Notes (Signed)
   Cardiologist:  Tolia/Lambert  Subjective:   Taking PO ? D/c today BP elevated   Objective:  Vitals:   10/25/22 1734 10/25/22 2040 10/26/22 0517 10/26/22 0806  BP: (!) 143/67 (!) 148/66 (!) 197/80 (!) 183/72  Pulse: 69 65 64 65  Resp: 16 16 20 16   Temp:   (!) 97.5 F (36.4 C) 97.7 F (36.5 C)  TempSrc:   Oral Oral  SpO2: 97% 94% 98% 97%  Weight:   (!) 157.2 kg   Height:        Intake/Output from previous day:  Intake/Output Summary (Last 24 hours) at 10/26/2022 0918 Last data filed at 10/25/2022 1145 Gross per 24 hour  Intake 480 ml  Output --  Net 480 ml    Physical Exam:  Elderly female Lungs clear No murmur PPM under left clavicle Post lap choly BS positive  Trace edema  Lab Results: Basic Metabolic Panel: Recent Labs    10/24/22 0659 10/25/22 0702  NA 135 139  K 4.6 5.5*  CL 102 107  CO2 21* 20*  GLUCOSE 167* 169*  BUN 53* 56*  CREATININE 3.37* 3.01*  CALCIUM 8.3* 8.7*   Liver Function Tests: Recent Labs    10/24/22 0659 10/25/22 0702  AST 150* 107*  ALT 290* 213*  ALKPHOS 429* 407*  BILITOT 1.0 0.8  PROT 5.9* 6.0*  ALBUMIN 2.2* 2.1*   No results for input(s): "LIPASE", "AMYLASE" in the last 72 hours.  CBC: Recent Labs    10/24/22 0659 10/25/22 0702  WBC 9.7 8.9  HGB 10.5* 10.9*  HCT 32.8* 33.2*  MCV 99.7 101.2*  PLT 228 233    No results for input(s): "HGBA1C" in the last 72 hours.   Imaging: No results found.  Cardiac Studies:  ECG: A sense V pace rate 76    Telemetry:  Echo: 10/13 EF 70-75% moderate LVH indeterminate diastolic parameters   Medications:    allopurinol  300 mg Oral Daily   atorvastatin  40 mg Oral Daily   guaiFENesin  600 mg Oral BID   heparin  5,000 Units Subcutaneous Q8H   insulin aspart  0-6 Units Subcutaneous TID WC   metoprolol succinate  50 mg Oral Daily   sacubitril-valsartan  1 tablet Oral BID        Assessment/Plan:   PPM:  MRI compatible Assurity device  DDD back up rate 60  Does not appear PPM dependant with only 9% V pacing  No issues with MRERCP set back to baseline parameters  Diastolic dysfunction:  no volume overload  Cholycystitis:  now post op day two BS positive passing gas continue no antibiotics now HTN:  Baseline renal function 1.68 now 3.01  and K 5.5 yesterday hold endtresto start bidil  Consider keeping in hospital until Cr improves BMET pending this am     Charlton Haws 10/26/2022, 9:18 AM

## 2022-10-26 NOTE — Progress Notes (Signed)
   10/26/22 0930  Mobility  Activity Ambulated with assistance in hallway;Ambulated with assistance to bathroom  Level of Assistance Contact guard assist, steadying assist  Assistive Device Front wheel walker  Distance Ambulated (ft) 225 ft  Activity Response Tolerated well  Mobility Referral Yes  $Mobility charge 1 Mobility  Mobility Specialist Start Time (ACUTE ONLY) 0900  Mobility Specialist Stop Time (ACUTE ONLY) [id number]  Mobility Specialist Time Calculation (min) (ACUTE ONLY) 18 min   Mobility Specialist: Progress Note  During Mobility: HR 60-75, SpO2 88-95 % RA Post-Mobility: SpO2 93% RA  Pt agreeable to mobility session - received in bed. Required CG using RW with one standing rest break (<50min). Pt was asymptomatic throughout session with no complaints. Returned to chair with all needs met - call bell within reach. Daughter present.   Upon revisiting to turn off a call light, Pt expressed her BP has been high. MS offered to take pt BP. Pt denied.  Barnie Mort, BS Mobility Specialist Please contact via SecureChat or Rehab office at [phone].

## 2022-10-27 DIAGNOSIS — I442 Atrioventricular block, complete: Secondary | ICD-10-CM | POA: Diagnosis not present

## 2022-10-27 DIAGNOSIS — I130 Hypertensive heart and chronic kidney disease with heart failure and stage 1 through stage 4 chronic kidney disease, or unspecified chronic kidney disease: Secondary | ICD-10-CM | POA: Diagnosis not present

## 2022-10-27 DIAGNOSIS — Z7984 Long term (current) use of oral hypoglycemic drugs: Secondary | ICD-10-CM | POA: Diagnosis not present

## 2022-10-27 DIAGNOSIS — I5033 Acute on chronic diastolic (congestive) heart failure: Secondary | ICD-10-CM | POA: Diagnosis not present

## 2022-10-27 DIAGNOSIS — I2510 Atherosclerotic heart disease of native coronary artery without angina pectoris: Secondary | ICD-10-CM | POA: Diagnosis not present

## 2022-10-27 DIAGNOSIS — I160 Hypertensive urgency: Secondary | ICD-10-CM | POA: Diagnosis not present

## 2022-10-27 DIAGNOSIS — E1122 Type 2 diabetes mellitus with diabetic chronic kidney disease: Secondary | ICD-10-CM | POA: Diagnosis not present

## 2022-10-27 DIAGNOSIS — E669 Obesity, unspecified: Secondary | ICD-10-CM | POA: Diagnosis not present

## 2022-10-27 DIAGNOSIS — Z9049 Acquired absence of other specified parts of digestive tract: Secondary | ICD-10-CM | POA: Diagnosis not present

## 2022-10-27 DIAGNOSIS — E1165 Type 2 diabetes mellitus with hyperglycemia: Secondary | ICD-10-CM | POA: Diagnosis not present

## 2022-10-27 DIAGNOSIS — Z950 Presence of cardiac pacemaker: Secondary | ICD-10-CM | POA: Diagnosis not present

## 2022-10-27 DIAGNOSIS — N184 Chronic kidney disease, stage 4 (severe): Secondary | ICD-10-CM | POA: Diagnosis not present

## 2022-10-27 DIAGNOSIS — Z48815 Encounter for surgical aftercare following surgery on the digestive system: Secondary | ICD-10-CM | POA: Diagnosis not present

## 2022-10-27 DIAGNOSIS — I447 Left bundle-branch block, unspecified: Secondary | ICD-10-CM | POA: Diagnosis not present

## 2022-10-27 DIAGNOSIS — E1169 Type 2 diabetes mellitus with other specified complication: Secondary | ICD-10-CM | POA: Diagnosis not present

## 2022-10-27 DIAGNOSIS — N179 Acute kidney failure, unspecified: Secondary | ICD-10-CM | POA: Diagnosis not present

## 2022-10-27 DIAGNOSIS — Z556 Problems related to health literacy: Secondary | ICD-10-CM | POA: Diagnosis not present

## 2022-10-27 DIAGNOSIS — Z6837 Body mass index (BMI) 37.0-37.9, adult: Secondary | ICD-10-CM | POA: Diagnosis not present

## 2022-10-27 DIAGNOSIS — E785 Hyperlipidemia, unspecified: Secondary | ICD-10-CM | POA: Diagnosis not present

## 2022-10-29 DIAGNOSIS — I5033 Acute on chronic diastolic (congestive) heart failure: Secondary | ICD-10-CM | POA: Diagnosis not present

## 2022-10-29 DIAGNOSIS — I442 Atrioventricular block, complete: Secondary | ICD-10-CM | POA: Diagnosis not present

## 2022-10-29 DIAGNOSIS — N179 Acute kidney failure, unspecified: Secondary | ICD-10-CM | POA: Diagnosis not present

## 2022-10-29 DIAGNOSIS — N184 Chronic kidney disease, stage 4 (severe): Secondary | ICD-10-CM | POA: Diagnosis not present

## 2022-10-29 DIAGNOSIS — Z7984 Long term (current) use of oral hypoglycemic drugs: Secondary | ICD-10-CM | POA: Diagnosis not present

## 2022-10-29 DIAGNOSIS — I160 Hypertensive urgency: Secondary | ICD-10-CM | POA: Diagnosis not present

## 2022-10-29 DIAGNOSIS — E785 Hyperlipidemia, unspecified: Secondary | ICD-10-CM | POA: Diagnosis not present

## 2022-10-29 DIAGNOSIS — Z6837 Body mass index (BMI) 37.0-37.9, adult: Secondary | ICD-10-CM | POA: Diagnosis not present

## 2022-10-29 DIAGNOSIS — I130 Hypertensive heart and chronic kidney disease with heart failure and stage 1 through stage 4 chronic kidney disease, or unspecified chronic kidney disease: Secondary | ICD-10-CM | POA: Diagnosis not present

## 2022-10-29 DIAGNOSIS — E1165 Type 2 diabetes mellitus with hyperglycemia: Secondary | ICD-10-CM | POA: Diagnosis not present

## 2022-10-29 DIAGNOSIS — I2510 Atherosclerotic heart disease of native coronary artery without angina pectoris: Secondary | ICD-10-CM | POA: Diagnosis not present

## 2022-10-29 DIAGNOSIS — I447 Left bundle-branch block, unspecified: Secondary | ICD-10-CM | POA: Diagnosis not present

## 2022-10-29 DIAGNOSIS — E1169 Type 2 diabetes mellitus with other specified complication: Secondary | ICD-10-CM | POA: Diagnosis not present

## 2022-10-29 DIAGNOSIS — Z48815 Encounter for surgical aftercare following surgery on the digestive system: Secondary | ICD-10-CM | POA: Diagnosis not present

## 2022-10-29 DIAGNOSIS — E1122 Type 2 diabetes mellitus with diabetic chronic kidney disease: Secondary | ICD-10-CM | POA: Diagnosis not present

## 2022-10-29 DIAGNOSIS — E669 Obesity, unspecified: Secondary | ICD-10-CM | POA: Diagnosis not present

## 2022-11-01 ENCOUNTER — Ambulatory Visit: Payer: Self-pay

## 2022-11-01 DIAGNOSIS — N179 Acute kidney failure, unspecified: Secondary | ICD-10-CM | POA: Diagnosis not present

## 2022-11-01 DIAGNOSIS — Z1389 Encounter for screening for other disorder: Secondary | ICD-10-CM | POA: Diagnosis not present

## 2022-11-01 DIAGNOSIS — Z0000 Encounter for general adult medical examination without abnormal findings: Secondary | ICD-10-CM | POA: Diagnosis not present

## 2022-11-01 NOTE — Patient Outreach (Signed)
Care Coordination   11/01/2022 Name: Connie Swanson MRN: [id number] DOB: Sep 22, 1940   Care Coordination Outreach Attempts:  An unsuccessful telephone outreach was attempted for a scheduled appointment today.  Follow Up Plan:  Additional outreach attempts will be made to offer the patient care coordination information and services.   Encounter Outcome:  Patient Request to Call Back   Care Coordination Interventions:  No, not indicated    Delsa Sale RN BSN CCM [place]  [facility], [facility] Nurse Care Coordinator  Direct Dial: [phone] Website: [email or web address]

## 2022-11-02 DIAGNOSIS — I130 Hypertensive heart and chronic kidney disease with heart failure and stage 1 through stage 4 chronic kidney disease, or unspecified chronic kidney disease: Secondary | ICD-10-CM | POA: Diagnosis not present

## 2022-11-02 DIAGNOSIS — Z48815 Encounter for surgical aftercare following surgery on the digestive system: Secondary | ICD-10-CM | POA: Diagnosis not present

## 2022-11-02 DIAGNOSIS — E785 Hyperlipidemia, unspecified: Secondary | ICD-10-CM | POA: Diagnosis not present

## 2022-11-02 DIAGNOSIS — E669 Obesity, unspecified: Secondary | ICD-10-CM | POA: Diagnosis not present

## 2022-11-02 DIAGNOSIS — I447 Left bundle-branch block, unspecified: Secondary | ICD-10-CM | POA: Diagnosis not present

## 2022-11-02 DIAGNOSIS — Z6837 Body mass index (BMI) 37.0-37.9, adult: Secondary | ICD-10-CM | POA: Diagnosis not present

## 2022-11-02 DIAGNOSIS — I442 Atrioventricular block, complete: Secondary | ICD-10-CM | POA: Diagnosis not present

## 2022-11-02 DIAGNOSIS — E1122 Type 2 diabetes mellitus with diabetic chronic kidney disease: Secondary | ICD-10-CM | POA: Diagnosis not present

## 2022-11-02 DIAGNOSIS — E1169 Type 2 diabetes mellitus with other specified complication: Secondary | ICD-10-CM | POA: Diagnosis not present

## 2022-11-02 DIAGNOSIS — N179 Acute kidney failure, unspecified: Secondary | ICD-10-CM | POA: Diagnosis not present

## 2022-11-02 DIAGNOSIS — N184 Chronic kidney disease, stage 4 (severe): Secondary | ICD-10-CM | POA: Diagnosis not present

## 2022-11-02 DIAGNOSIS — I5033 Acute on chronic diastolic (congestive) heart failure: Secondary | ICD-10-CM | POA: Diagnosis not present

## 2022-11-02 DIAGNOSIS — E1165 Type 2 diabetes mellitus with hyperglycemia: Secondary | ICD-10-CM | POA: Diagnosis not present

## 2022-11-02 DIAGNOSIS — I2510 Atherosclerotic heart disease of native coronary artery without angina pectoris: Secondary | ICD-10-CM | POA: Diagnosis not present

## 2022-11-02 DIAGNOSIS — I160 Hypertensive urgency: Secondary | ICD-10-CM | POA: Diagnosis not present

## 2022-11-02 DIAGNOSIS — Z7984 Long term (current) use of oral hypoglycemic drugs: Secondary | ICD-10-CM | POA: Diagnosis not present

## 2022-11-06 ENCOUNTER — Ambulatory Visit: Payer: Self-pay

## 2022-11-06 NOTE — Patient Instructions (Signed)
Visit Information  Thank you for taking time to visit with me today. Please don't hesitate to contact me if I can be of assistance to you.   Following are the goals we discussed today:   Goals Addressed             This Visit's Progress    COMPLETED: RN Care Coordination Activities: further follow up is needed       Care Coordination Interventions: Determined patient was discharged home following an admission for Acute Cholecystitis w/Cholecystectomy (See new goal)     To recover from recent gall bladder removal       Care Coordination Interventions: Evaluation of current treatment plan related to laparoscopic Cholecystectomy   and patient's adherence to plan as established by provider Determined patient has been discharged home following acute Cholecystitis requiring a laparoscopic Cholecystectomy Reviewed and discussed with patient her post discharge recommendations; Recommendations at discharge:  Continue holding Entresto due to hyperkalemia, and recovering GFR. Follow up renal function and electrolytes in 7 days.  Follow up with Dr Wylene Simmer in 7 to 10 days. Follow up with surgery and cardiology as scheduled.  Determined patient completed her PCP follow up as directed with follow up labs completed Reviewed and discussed with patient her post operative follow up with general surgery  Hillery Hunter [name], MD Follow up on 11/14/2022.   Specialty: General Surgery Why: 11:00a, Arrive 30 minutes prior to your appointment time, Please bring your insurance card and photo ID Contact information: [address] [place] [address] [phone] Determined patient has 6 small abdominal incisions covered with clear dressing, she was instructed not to shower, reports no issues or concerns related to her incisions with no evidence of infection noted Educated patient, providing rationale about dietary recommendations for a low fat diet following gall bladder removal and patient  verbalizes understanding         Our next appointment is by telephone on 12/04/22 at 12:00 PM  Please call the care guide team at [phone] if you need to cancel or reschedule your appointment.   If you are experiencing a Mental Health or Behavioral Health Crisis or need someone to talk to, please call 1-800-273-TALK (toll free, 24 hour hotline)  The patient verbalized understanding of instructions, educational materials, and care plan provided today and DECLINED offer to receive copy of patient instructions, educational materials, and care plan.   Delsa Sale RN BSN CCM [place]  [facility], [facility] Nurse Care Coordinator  Direct Dial: [phone] Website: [email or web address]

## 2022-11-06 NOTE — Patient Outreach (Signed)
Care Coordination   Follow Up Visit Note   11/06/2022 Name: Connie Swanson MRN: [id number] DOB: [birth year]  Connie Swanson is a 82 y.o. year old female who sees Connie Swanson, Connie Koh, MD for primary care. I spoke with  Connie Swanson by phone today.  What matters to the patients health and wellness today?  Patient would like to make a full recovery from her gall bladder surgery.     Goals Addressed             This Visit's Progress    COMPLETED: RN Care Coordination Activities: further follow up is needed       Care Coordination Interventions: Determined patient was discharged home following an admission for Acute Cholecystitis w/Cholecystectomy (See new goal)      To recover from recent gall bladder removal       Care Coordination Interventions: Evaluation of current treatment plan related to laparoscopic Cholecystectomy   and patient's adherence to plan as established by provider Determined patient has been discharged home following acute Cholecystitis requiring a laparoscopic Cholecystectomy Reviewed and discussed with patient her post discharge recommendations; Recommendations at discharge:  Continue holding Entresto due to hyperkalemia, and recovering GFR. Follow up renal function and electrolytes in 7 days.  Follow up with Dr Wylene Simmer in 7 to 10 days. Follow up with surgery and cardiology as scheduled.  Determined patient completed her PCP follow up as directed with follow up labs completed Reviewed and discussed with patient her post operative follow up with general surgery  Connie Swanson Connie Edin, MD Follow up on 11/14/2022.   Specialty: General Surgery Why: 11:00a, Arrive 30 minutes prior to your appointment time, Please bring your insurance card and photo ID Contact information: [address] [place] [address] [phone] Determined patient has 6 small abdominal incisions covered with clear dressing, she was instructed not to shower, reports no issues or concerns  related to her incisions with no evidence of infection noted Educated patient, providing rationale about dietary recommendations for a low fat diet following gall bladder removal and patient verbalizes understanding     Interventions Today    Flowsheet Row Most Recent Value  Chronic Disease   Chronic disease during today's visit Other  [s/p cholecystectomy]  General Interventions   General Interventions Discussed/Reviewed General Interventions Discussed, General Interventions Reviewed, Labs, Doctor Visits  Doctor Visits Discussed/Reviewed Doctor Visits Discussed, Doctor Visits Reviewed, Specialist, PCP  Education Interventions   Education Provided Provided Education, Provided Printed Education  Provided Verbal Education On Nutrition, Labs, When to see the doctor  Labs Reviewed Kidney Function  Nutrition Interventions   Nutrition Discussed/Reviewed Nutrition Discussed, Nutrition Reviewed, Fluid intake, Decreasing fats          SDOH assessments and interventions completed:  No     Care Coordination Interventions:  Yes, provided   Follow up plan: Follow up call scheduled for 12/04/22 @12 :00 PM    Encounter Outcome:  Patient Visit Completed

## 2022-11-07 DIAGNOSIS — I447 Left bundle-branch block, unspecified: Secondary | ICD-10-CM | POA: Diagnosis not present

## 2022-11-07 DIAGNOSIS — Z48815 Encounter for surgical aftercare following surgery on the digestive system: Secondary | ICD-10-CM | POA: Diagnosis not present

## 2022-11-07 DIAGNOSIS — E1169 Type 2 diabetes mellitus with other specified complication: Secondary | ICD-10-CM | POA: Diagnosis not present

## 2022-11-07 DIAGNOSIS — Z7984 Long term (current) use of oral hypoglycemic drugs: Secondary | ICD-10-CM | POA: Diagnosis not present

## 2022-11-07 DIAGNOSIS — E785 Hyperlipidemia, unspecified: Secondary | ICD-10-CM | POA: Diagnosis not present

## 2022-11-07 DIAGNOSIS — I160 Hypertensive urgency: Secondary | ICD-10-CM | POA: Diagnosis not present

## 2022-11-07 DIAGNOSIS — E1165 Type 2 diabetes mellitus with hyperglycemia: Secondary | ICD-10-CM | POA: Diagnosis not present

## 2022-11-07 DIAGNOSIS — N179 Acute kidney failure, unspecified: Secondary | ICD-10-CM | POA: Diagnosis not present

## 2022-11-07 DIAGNOSIS — I5033 Acute on chronic diastolic (congestive) heart failure: Secondary | ICD-10-CM | POA: Diagnosis not present

## 2022-11-07 DIAGNOSIS — N184 Chronic kidney disease, stage 4 (severe): Secondary | ICD-10-CM | POA: Diagnosis not present

## 2022-11-07 DIAGNOSIS — E1122 Type 2 diabetes mellitus with diabetic chronic kidney disease: Secondary | ICD-10-CM | POA: Diagnosis not present

## 2022-11-07 DIAGNOSIS — I130 Hypertensive heart and chronic kidney disease with heart failure and stage 1 through stage 4 chronic kidney disease, or unspecified chronic kidney disease: Secondary | ICD-10-CM | POA: Diagnosis not present

## 2022-11-07 DIAGNOSIS — E669 Obesity, unspecified: Secondary | ICD-10-CM | POA: Diagnosis not present

## 2022-11-07 DIAGNOSIS — I442 Atrioventricular block, complete: Secondary | ICD-10-CM | POA: Diagnosis not present

## 2022-11-07 DIAGNOSIS — Z6837 Body mass index (BMI) 37.0-37.9, adult: Secondary | ICD-10-CM | POA: Diagnosis not present

## 2022-11-07 DIAGNOSIS — I2510 Atherosclerotic heart disease of native coronary artery without angina pectoris: Secondary | ICD-10-CM | POA: Diagnosis not present

## 2022-11-08 DIAGNOSIS — N184 Chronic kidney disease, stage 4 (severe): Secondary | ICD-10-CM | POA: Diagnosis not present

## 2022-11-08 DIAGNOSIS — Z6837 Body mass index (BMI) 37.0-37.9, adult: Secondary | ICD-10-CM | POA: Diagnosis not present

## 2022-11-08 DIAGNOSIS — Z7984 Long term (current) use of oral hypoglycemic drugs: Secondary | ICD-10-CM | POA: Diagnosis not present

## 2022-11-08 DIAGNOSIS — E785 Hyperlipidemia, unspecified: Secondary | ICD-10-CM | POA: Diagnosis not present

## 2022-11-08 DIAGNOSIS — E1165 Type 2 diabetes mellitus with hyperglycemia: Secondary | ICD-10-CM | POA: Diagnosis not present

## 2022-11-08 DIAGNOSIS — E1122 Type 2 diabetes mellitus with diabetic chronic kidney disease: Secondary | ICD-10-CM | POA: Diagnosis not present

## 2022-11-08 DIAGNOSIS — E669 Obesity, unspecified: Secondary | ICD-10-CM | POA: Diagnosis not present

## 2022-11-08 DIAGNOSIS — I160 Hypertensive urgency: Secondary | ICD-10-CM | POA: Diagnosis not present

## 2022-11-08 DIAGNOSIS — N179 Acute kidney failure, unspecified: Secondary | ICD-10-CM | POA: Diagnosis not present

## 2022-11-08 DIAGNOSIS — I2510 Atherosclerotic heart disease of native coronary artery without angina pectoris: Secondary | ICD-10-CM | POA: Diagnosis not present

## 2022-11-08 DIAGNOSIS — I442 Atrioventricular block, complete: Secondary | ICD-10-CM | POA: Diagnosis not present

## 2022-11-08 DIAGNOSIS — Z48815 Encounter for surgical aftercare following surgery on the digestive system: Secondary | ICD-10-CM | POA: Diagnosis not present

## 2022-11-08 DIAGNOSIS — I130 Hypertensive heart and chronic kidney disease with heart failure and stage 1 through stage 4 chronic kidney disease, or unspecified chronic kidney disease: Secondary | ICD-10-CM | POA: Diagnosis not present

## 2022-11-08 DIAGNOSIS — E1169 Type 2 diabetes mellitus with other specified complication: Secondary | ICD-10-CM | POA: Diagnosis not present

## 2022-11-08 DIAGNOSIS — I5033 Acute on chronic diastolic (congestive) heart failure: Secondary | ICD-10-CM | POA: Diagnosis not present

## 2022-11-08 DIAGNOSIS — I447 Left bundle-branch block, unspecified: Secondary | ICD-10-CM | POA: Diagnosis not present

## 2022-11-09 DIAGNOSIS — I160 Hypertensive urgency: Secondary | ICD-10-CM | POA: Diagnosis not present

## 2022-11-09 DIAGNOSIS — E1122 Type 2 diabetes mellitus with diabetic chronic kidney disease: Secondary | ICD-10-CM | POA: Diagnosis not present

## 2022-11-09 DIAGNOSIS — Z7984 Long term (current) use of oral hypoglycemic drugs: Secondary | ICD-10-CM | POA: Diagnosis not present

## 2022-11-09 DIAGNOSIS — I2510 Atherosclerotic heart disease of native coronary artery without angina pectoris: Secondary | ICD-10-CM | POA: Diagnosis not present

## 2022-11-09 DIAGNOSIS — E785 Hyperlipidemia, unspecified: Secondary | ICD-10-CM | POA: Diagnosis not present

## 2022-11-09 DIAGNOSIS — N184 Chronic kidney disease, stage 4 (severe): Secondary | ICD-10-CM | POA: Diagnosis not present

## 2022-11-09 DIAGNOSIS — E1169 Type 2 diabetes mellitus with other specified complication: Secondary | ICD-10-CM | POA: Diagnosis not present

## 2022-11-09 DIAGNOSIS — Z6837 Body mass index (BMI) 37.0-37.9, adult: Secondary | ICD-10-CM | POA: Diagnosis not present

## 2022-11-09 DIAGNOSIS — E669 Obesity, unspecified: Secondary | ICD-10-CM | POA: Diagnosis not present

## 2022-11-09 DIAGNOSIS — N179 Acute kidney failure, unspecified: Secondary | ICD-10-CM | POA: Diagnosis not present

## 2022-11-09 DIAGNOSIS — I5033 Acute on chronic diastolic (congestive) heart failure: Secondary | ICD-10-CM | POA: Diagnosis not present

## 2022-11-09 DIAGNOSIS — I442 Atrioventricular block, complete: Secondary | ICD-10-CM | POA: Diagnosis not present

## 2022-11-09 DIAGNOSIS — I130 Hypertensive heart and chronic kidney disease with heart failure and stage 1 through stage 4 chronic kidney disease, or unspecified chronic kidney disease: Secondary | ICD-10-CM | POA: Diagnosis not present

## 2022-11-09 DIAGNOSIS — E1165 Type 2 diabetes mellitus with hyperglycemia: Secondary | ICD-10-CM | POA: Diagnosis not present

## 2022-11-09 DIAGNOSIS — Z48815 Encounter for surgical aftercare following surgery on the digestive system: Secondary | ICD-10-CM | POA: Diagnosis not present

## 2022-11-09 DIAGNOSIS — I447 Left bundle-branch block, unspecified: Secondary | ICD-10-CM | POA: Diagnosis not present

## 2022-11-11 ENCOUNTER — Other Ambulatory Visit: Payer: Self-pay | Admitting: Cardiology

## 2022-11-11 DIAGNOSIS — I10 Essential (primary) hypertension: Secondary | ICD-10-CM

## 2022-11-11 DIAGNOSIS — I5032 Chronic diastolic (congestive) heart failure: Secondary | ICD-10-CM

## 2022-11-12 DIAGNOSIS — E1169 Type 2 diabetes mellitus with other specified complication: Secondary | ICD-10-CM | POA: Diagnosis not present

## 2022-11-12 DIAGNOSIS — I442 Atrioventricular block, complete: Secondary | ICD-10-CM | POA: Diagnosis not present

## 2022-11-12 DIAGNOSIS — N184 Chronic kidney disease, stage 4 (severe): Secondary | ICD-10-CM | POA: Diagnosis not present

## 2022-11-12 DIAGNOSIS — N179 Acute kidney failure, unspecified: Secondary | ICD-10-CM | POA: Diagnosis not present

## 2022-11-12 DIAGNOSIS — I160 Hypertensive urgency: Secondary | ICD-10-CM | POA: Diagnosis not present

## 2022-11-12 DIAGNOSIS — E1165 Type 2 diabetes mellitus with hyperglycemia: Secondary | ICD-10-CM | POA: Diagnosis not present

## 2022-11-12 DIAGNOSIS — I447 Left bundle-branch block, unspecified: Secondary | ICD-10-CM | POA: Diagnosis not present

## 2022-11-12 DIAGNOSIS — Z7984 Long term (current) use of oral hypoglycemic drugs: Secondary | ICD-10-CM | POA: Diagnosis not present

## 2022-11-12 DIAGNOSIS — I5033 Acute on chronic diastolic (congestive) heart failure: Secondary | ICD-10-CM | POA: Diagnosis not present

## 2022-11-12 DIAGNOSIS — E669 Obesity, unspecified: Secondary | ICD-10-CM | POA: Diagnosis not present

## 2022-11-12 DIAGNOSIS — Z48815 Encounter for surgical aftercare following surgery on the digestive system: Secondary | ICD-10-CM | POA: Diagnosis not present

## 2022-11-12 DIAGNOSIS — I2510 Atherosclerotic heart disease of native coronary artery without angina pectoris: Secondary | ICD-10-CM | POA: Diagnosis not present

## 2022-11-12 DIAGNOSIS — E785 Hyperlipidemia, unspecified: Secondary | ICD-10-CM | POA: Diagnosis not present

## 2022-11-12 DIAGNOSIS — Z6837 Body mass index (BMI) 37.0-37.9, adult: Secondary | ICD-10-CM | POA: Diagnosis not present

## 2022-11-12 DIAGNOSIS — I130 Hypertensive heart and chronic kidney disease with heart failure and stage 1 through stage 4 chronic kidney disease, or unspecified chronic kidney disease: Secondary | ICD-10-CM | POA: Diagnosis not present

## 2022-11-12 DIAGNOSIS — E1122 Type 2 diabetes mellitus with diabetic chronic kidney disease: Secondary | ICD-10-CM | POA: Diagnosis not present

## 2022-11-13 ENCOUNTER — Other Ambulatory Visit (HOSPITAL_COMMUNITY): Payer: Self-pay | Admitting: *Deleted

## 2022-11-14 ENCOUNTER — Ambulatory Visit (HOSPITAL_COMMUNITY)
Admission: RE | Admit: 2022-11-14 | Discharge: 2022-11-14 | Disposition: A | Discharge status: Home / Self Care | Payer: Medicare Other | Source: Ambulatory Visit | Attending: Internal Medicine | Admitting: Internal Medicine

## 2022-11-14 ENCOUNTER — Encounter: Payer: Self-pay | Admitting: Podiatry

## 2022-11-14 ENCOUNTER — Ambulatory Visit: Payer: Medicare Other | Admitting: Podiatry

## 2022-11-14 DIAGNOSIS — M79675 Pain in left toe(s): Secondary | ICD-10-CM | POA: Diagnosis not present

## 2022-11-14 DIAGNOSIS — M79674 Pain in right toe(s): Secondary | ICD-10-CM

## 2022-11-14 DIAGNOSIS — B351 Tinea unguium: Secondary | ICD-10-CM | POA: Diagnosis not present

## 2022-11-14 DIAGNOSIS — M810 Age-related osteoporosis without current pathological fracture: Secondary | ICD-10-CM | POA: Diagnosis not present

## 2022-11-14 DIAGNOSIS — E119 Type 2 diabetes mellitus without complications: Secondary | ICD-10-CM | POA: Diagnosis not present

## 2022-11-14 MED ORDER — DENOSUMAB 60 MG/ML ~~LOC~~ SOSY
PREFILLED_SYRINGE | SUBCUTANEOUS | Status: AC
  Filled 2022-11-14: qty 1

## 2022-11-14 MED ORDER — DENOSUMAB 60 MG/ML ~~LOC~~ SOSY
60.0000 mg | PREFILLED_SYRINGE | Freq: Once | SUBCUTANEOUS | Status: AC
  Administered 2022-11-14: 60 mg via SUBCUTANEOUS

## 2022-11-14 NOTE — Progress Notes (Signed)
This patient returns to my office for at risk foot care.  This patient requires this care by a professional since this patient will be at risk due to having diabetes and chronic kidney disease   This patient is unable to cut nails herself since the patient cannot reach her nails.These nails are painful walking and wearing shoes.  This patient presents for at risk foot care today.  General Appearance  Alert, conversant and in no acute stress.  Vascular  Dorsalis pedis and posterior tibial  pulses are palpable  bilaterally.  Capillary return is within normal limits  bilaterally. Temperature is within normal limits  bilaterally.  Neurologic  Senn-Weinstein monofilament wire test within normal limits  bilaterally. Muscle power within normal limits bilaterally.  Nails Thick disfigured discolored nails with subungual debris  Hallux nails  bilaterally. No evidence of bacterial infection or drainage bilaterally.  Orthopedic  No limitations of motion  feet .  No crepitus or effusions noted.  No bony pathology or digital deformities noted.Mild  HAV  B/L.  Skin  normotropic skin with no porokeratosis noted bilaterally.  No signs of infections or ulcers noted.     Onychomycosis  Pain in right toes  Pain in left toes  Consent was obtained for treatment procedures.   Mechanical debridement of nails 1-5  bilaterally performed with a nail nipper.  Filed with dremel without incident.    Return office visit  10 weeks                   Told patient to return for periodic foot care and evaluation due to potential at risk complications.   Gardiner Barefoot DPM

## 2022-11-15 DIAGNOSIS — I130 Hypertensive heart and chronic kidney disease with heart failure and stage 1 through stage 4 chronic kidney disease, or unspecified chronic kidney disease: Secondary | ICD-10-CM | POA: Diagnosis not present

## 2022-11-15 DIAGNOSIS — N179 Acute kidney failure, unspecified: Secondary | ICD-10-CM | POA: Diagnosis not present

## 2022-11-15 DIAGNOSIS — N184 Chronic kidney disease, stage 4 (severe): Secondary | ICD-10-CM | POA: Diagnosis not present

## 2022-11-15 DIAGNOSIS — E785 Hyperlipidemia, unspecified: Secondary | ICD-10-CM | POA: Diagnosis not present

## 2022-11-15 DIAGNOSIS — Z6837 Body mass index (BMI) 37.0-37.9, adult: Secondary | ICD-10-CM | POA: Diagnosis not present

## 2022-11-15 DIAGNOSIS — E1169 Type 2 diabetes mellitus with other specified complication: Secondary | ICD-10-CM | POA: Diagnosis not present

## 2022-11-15 DIAGNOSIS — I2510 Atherosclerotic heart disease of native coronary artery without angina pectoris: Secondary | ICD-10-CM | POA: Diagnosis not present

## 2022-11-15 DIAGNOSIS — E669 Obesity, unspecified: Secondary | ICD-10-CM | POA: Diagnosis not present

## 2022-11-15 DIAGNOSIS — E1165 Type 2 diabetes mellitus with hyperglycemia: Secondary | ICD-10-CM | POA: Diagnosis not present

## 2022-11-15 DIAGNOSIS — I160 Hypertensive urgency: Secondary | ICD-10-CM | POA: Diagnosis not present

## 2022-11-15 DIAGNOSIS — E1122 Type 2 diabetes mellitus with diabetic chronic kidney disease: Secondary | ICD-10-CM | POA: Diagnosis not present

## 2022-11-15 DIAGNOSIS — I5033 Acute on chronic diastolic (congestive) heart failure: Secondary | ICD-10-CM | POA: Diagnosis not present

## 2022-11-15 DIAGNOSIS — I447 Left bundle-branch block, unspecified: Secondary | ICD-10-CM | POA: Diagnosis not present

## 2022-11-15 DIAGNOSIS — I442 Atrioventricular block, complete: Secondary | ICD-10-CM | POA: Diagnosis not present

## 2022-11-15 DIAGNOSIS — Z48815 Encounter for surgical aftercare following surgery on the digestive system: Secondary | ICD-10-CM | POA: Diagnosis not present

## 2022-11-15 DIAGNOSIS — Z7984 Long term (current) use of oral hypoglycemic drugs: Secondary | ICD-10-CM | POA: Diagnosis not present

## 2022-11-20 DIAGNOSIS — N179 Acute kidney failure, unspecified: Secondary | ICD-10-CM | POA: Diagnosis not present

## 2022-11-20 DIAGNOSIS — E1122 Type 2 diabetes mellitus with diabetic chronic kidney disease: Secondary | ICD-10-CM | POA: Diagnosis not present

## 2022-11-20 DIAGNOSIS — E1165 Type 2 diabetes mellitus with hyperglycemia: Secondary | ICD-10-CM | POA: Diagnosis not present

## 2022-11-20 DIAGNOSIS — E785 Hyperlipidemia, unspecified: Secondary | ICD-10-CM | POA: Diagnosis not present

## 2022-11-20 DIAGNOSIS — E1169 Type 2 diabetes mellitus with other specified complication: Secondary | ICD-10-CM | POA: Diagnosis not present

## 2022-11-20 DIAGNOSIS — Z6837 Body mass index (BMI) 37.0-37.9, adult: Secondary | ICD-10-CM | POA: Diagnosis not present

## 2022-11-20 DIAGNOSIS — I130 Hypertensive heart and chronic kidney disease with heart failure and stage 1 through stage 4 chronic kidney disease, or unspecified chronic kidney disease: Secondary | ICD-10-CM | POA: Diagnosis not present

## 2022-11-20 DIAGNOSIS — I5033 Acute on chronic diastolic (congestive) heart failure: Secondary | ICD-10-CM | POA: Diagnosis not present

## 2022-11-20 DIAGNOSIS — I2510 Atherosclerotic heart disease of native coronary artery without angina pectoris: Secondary | ICD-10-CM | POA: Diagnosis not present

## 2022-11-20 DIAGNOSIS — I447 Left bundle-branch block, unspecified: Secondary | ICD-10-CM | POA: Diagnosis not present

## 2022-11-20 DIAGNOSIS — I442 Atrioventricular block, complete: Secondary | ICD-10-CM | POA: Diagnosis not present

## 2022-11-20 DIAGNOSIS — I160 Hypertensive urgency: Secondary | ICD-10-CM | POA: Diagnosis not present

## 2022-11-20 DIAGNOSIS — N184 Chronic kidney disease, stage 4 (severe): Secondary | ICD-10-CM | POA: Diagnosis not present

## 2022-11-20 DIAGNOSIS — E669 Obesity, unspecified: Secondary | ICD-10-CM | POA: Diagnosis not present

## 2022-11-20 DIAGNOSIS — Z48815 Encounter for surgical aftercare following surgery on the digestive system: Secondary | ICD-10-CM | POA: Diagnosis not present

## 2022-11-20 DIAGNOSIS — Z7984 Long term (current) use of oral hypoglycemic drugs: Secondary | ICD-10-CM | POA: Diagnosis not present

## 2022-11-26 DIAGNOSIS — Z950 Presence of cardiac pacemaker: Secondary | ICD-10-CM | POA: Diagnosis not present

## 2022-11-26 DIAGNOSIS — I442 Atrioventricular block, complete: Secondary | ICD-10-CM | POA: Diagnosis not present

## 2022-11-26 DIAGNOSIS — Z6837 Body mass index (BMI) 37.0-37.9, adult: Secondary | ICD-10-CM | POA: Diagnosis not present

## 2022-11-26 DIAGNOSIS — E1122 Type 2 diabetes mellitus with diabetic chronic kidney disease: Secondary | ICD-10-CM | POA: Diagnosis not present

## 2022-11-26 DIAGNOSIS — Z7984 Long term (current) use of oral hypoglycemic drugs: Secondary | ICD-10-CM | POA: Diagnosis not present

## 2022-11-26 DIAGNOSIS — E1169 Type 2 diabetes mellitus with other specified complication: Secondary | ICD-10-CM | POA: Diagnosis not present

## 2022-11-26 DIAGNOSIS — I160 Hypertensive urgency: Secondary | ICD-10-CM | POA: Diagnosis not present

## 2022-11-26 DIAGNOSIS — I2510 Atherosclerotic heart disease of native coronary artery without angina pectoris: Secondary | ICD-10-CM | POA: Diagnosis not present

## 2022-11-26 DIAGNOSIS — N179 Acute kidney failure, unspecified: Secondary | ICD-10-CM | POA: Diagnosis not present

## 2022-11-26 DIAGNOSIS — E1165 Type 2 diabetes mellitus with hyperglycemia: Secondary | ICD-10-CM | POA: Diagnosis not present

## 2022-11-26 DIAGNOSIS — E669 Obesity, unspecified: Secondary | ICD-10-CM | POA: Diagnosis not present

## 2022-11-26 DIAGNOSIS — I130 Hypertensive heart and chronic kidney disease with heart failure and stage 1 through stage 4 chronic kidney disease, or unspecified chronic kidney disease: Secondary | ICD-10-CM | POA: Diagnosis not present

## 2022-11-26 DIAGNOSIS — I447 Left bundle-branch block, unspecified: Secondary | ICD-10-CM | POA: Diagnosis not present

## 2022-11-26 DIAGNOSIS — Z556 Problems related to health literacy: Secondary | ICD-10-CM | POA: Diagnosis not present

## 2022-11-26 DIAGNOSIS — I5033 Acute on chronic diastolic (congestive) heart failure: Secondary | ICD-10-CM | POA: Diagnosis not present

## 2022-11-26 DIAGNOSIS — Z9049 Acquired absence of other specified parts of digestive tract: Secondary | ICD-10-CM | POA: Diagnosis not present

## 2022-11-26 DIAGNOSIS — Z48815 Encounter for surgical aftercare following surgery on the digestive system: Secondary | ICD-10-CM | POA: Diagnosis not present

## 2022-11-26 DIAGNOSIS — E785 Hyperlipidemia, unspecified: Secondary | ICD-10-CM | POA: Diagnosis not present

## 2022-11-26 DIAGNOSIS — N184 Chronic kidney disease, stage 4 (severe): Secondary | ICD-10-CM | POA: Diagnosis not present

## 2022-11-29 DIAGNOSIS — I2510 Atherosclerotic heart disease of native coronary artery without angina pectoris: Secondary | ICD-10-CM | POA: Diagnosis not present

## 2022-11-29 DIAGNOSIS — I160 Hypertensive urgency: Secondary | ICD-10-CM | POA: Diagnosis not present

## 2022-11-29 DIAGNOSIS — E1165 Type 2 diabetes mellitus with hyperglycemia: Secondary | ICD-10-CM | POA: Diagnosis not present

## 2022-11-29 DIAGNOSIS — Z6837 Body mass index (BMI) 37.0-37.9, adult: Secondary | ICD-10-CM | POA: Diagnosis not present

## 2022-11-29 DIAGNOSIS — N184 Chronic kidney disease, stage 4 (severe): Secondary | ICD-10-CM | POA: Diagnosis not present

## 2022-11-29 DIAGNOSIS — I5033 Acute on chronic diastolic (congestive) heart failure: Secondary | ICD-10-CM | POA: Diagnosis not present

## 2022-11-29 DIAGNOSIS — Z7984 Long term (current) use of oral hypoglycemic drugs: Secondary | ICD-10-CM | POA: Diagnosis not present

## 2022-11-29 DIAGNOSIS — E785 Hyperlipidemia, unspecified: Secondary | ICD-10-CM | POA: Diagnosis not present

## 2022-11-29 DIAGNOSIS — I130 Hypertensive heart and chronic kidney disease with heart failure and stage 1 through stage 4 chronic kidney disease, or unspecified chronic kidney disease: Secondary | ICD-10-CM | POA: Diagnosis not present

## 2022-11-29 DIAGNOSIS — Z48815 Encounter for surgical aftercare following surgery on the digestive system: Secondary | ICD-10-CM | POA: Diagnosis not present

## 2022-11-29 DIAGNOSIS — E1169 Type 2 diabetes mellitus with other specified complication: Secondary | ICD-10-CM | POA: Diagnosis not present

## 2022-11-29 DIAGNOSIS — I447 Left bundle-branch block, unspecified: Secondary | ICD-10-CM | POA: Diagnosis not present

## 2022-11-29 DIAGNOSIS — E669 Obesity, unspecified: Secondary | ICD-10-CM | POA: Diagnosis not present

## 2022-11-29 DIAGNOSIS — N179 Acute kidney failure, unspecified: Secondary | ICD-10-CM | POA: Diagnosis not present

## 2022-11-29 DIAGNOSIS — I442 Atrioventricular block, complete: Secondary | ICD-10-CM | POA: Diagnosis not present

## 2022-11-29 DIAGNOSIS — E1122 Type 2 diabetes mellitus with diabetic chronic kidney disease: Secondary | ICD-10-CM | POA: Diagnosis not present

## 2022-12-03 DIAGNOSIS — E1165 Type 2 diabetes mellitus with hyperglycemia: Secondary | ICD-10-CM | POA: Diagnosis not present

## 2022-12-04 ENCOUNTER — Ambulatory Visit: Payer: Self-pay

## 2022-12-04 NOTE — Patient Instructions (Signed)
Visit Information  Thank you for taking time to visit with me today. Please don't hesitate to contact me if I can be of assistance to you.   Following are the goals we discussed today:   Goals Addressed             This Visit's Progress    To improve kidney function       Care Coordination Interventions: Assessed the Patient understanding of chronic kidney disease    Evaluation of current treatment plan related to chronic kidney disease self management and patient's adherence to plan as established by provider      Reviewed prescribed diet increase daily water intake to 48-64 oz daily unless otherwise directed by your doctor  Advised patient to discuss decreased GFR with provider    Provided education on kidney disease progression    Patient will increase her daily water intake to 48-64 oz of water daily unless otherwise directed by her doctor  Patient will contact [facility] (phone number provided), today to report a recent decrease in her renal function following gall bladder surgery  Patient will continue to work with nurse care coordinator for chronic disease management and care coordination needs         To recover from recent gall bladder removal   On track    Care Coordination Interventions: Evaluation of current treatment plan related to laparoscopic Cholecystectomy   and patient's adherence to plan as established by provider Reviewed and discussed with patient her recent post operative follow up for evaluation of s/p laparoscopic Cholecystectomy  Review of patient status, including review of consultant's reports, relevant laboratory and other test results, and medications completed Patient will continue to adhere to her prescribed treatment plan follow gall bladder removal Patient will report new or worsening symptoms to her doctor promptly  Patient will continue to work with nurse care coordinator for chronic disease management and care coordination needs         Our next appointment is by telephone on 01/03/23 at 12:30 PM  Please call the care guide team at [phone] if you need to cancel or reschedule your appointment.   If you are experiencing a Mental Health or Behavioral Health Crisis or need someone to talk to, please call 1-800-273-TALK (toll free, 24 hour hotline)  Patient verbalizes understanding of instructions and care plan provided today and agrees to view in MyChart. Active MyChart status and patient understanding of how to access instructions and care plan via MyChart confirmed with patient.     Delsa Sale RN BSN CCM [place]  [facility], [facility] Nurse Care Coordinator  Direct Dial: [phone] Website: [email or web address]

## 2022-12-04 NOTE — Patient Outreach (Signed)
  Care Coordination   Follow Up Visit Note   12/04/2022 Name: Connie Swanson MRN: [id number] DOB: [birth year]  Connie Swanson is a 82 y.o. year old female who sees [name], Adelfa Koh, MD for primary care. I spoke with  Marcelyn Ditty by phone today.  What matters to the patients health and wellness today?  Patient will contact her kidney doctor today, to notify him of decrease in renal function.     Goals Addressed             This Visit's Progress    To improve kidney function       Care Coordination Interventions: Assessed the Patient understanding of chronic kidney disease    Evaluation of current treatment plan related to chronic kidney disease self management and patient's adherence to plan as established by provider      Reviewed prescribed diet increase daily water intake to 48-64 oz daily unless otherwise directed by your doctor  Advised patient to discuss decreased GFR with provider    Provided education on kidney disease progression    Patient will increase her daily water intake to 48-64 oz of water daily unless otherwise directed by her doctor  Patient will contact [facility] (phone number provided), today to report a recent decrease in her renal function following gall bladder surgery  Patient will continue to work with nurse care coordinator for chronic disease management and care coordination needs      To recover from recent gall bladder removal   On track    Care Coordination Interventions: Evaluation of current treatment plan related to laparoscopic Cholecystectomy   and patient's adherence to plan as established by provider Reviewed and discussed with patient her recent post operative follow up for evaluation of s/p laparoscopic Cholecystectomy  Review of patient status, including review of consultant's reports, relevant laboratory and other test results, and medications completed Patient will continue to adhere to her prescribed treatment plan follow gall bladder  removal Patient will report new or worsening symptoms to her doctor promptly  Patient will continue to work with nurse care coordinator for chronic disease management and care coordination needs    Interventions Today    Flowsheet Row Most Recent Value  Chronic Disease   Chronic disease during today's visit Chronic Kidney Disease/End Stage Renal Disease (ESRD), Other  [s/p lap choley]  General Interventions   General Interventions Discussed/Reviewed General Interventions Discussed, General Interventions Reviewed, Labs, Doctor Visits  Doctor Visits Discussed/Reviewed Doctor Visits Discussed, Doctor Visits Reviewed, Specialist  Education Interventions   Education Provided Provided Education  Provided Verbal Education On Labs, When to see the doctor, Nutrition  Labs Reviewed Kidney Function  Nutrition Interventions   Nutrition Discussed/Reviewed Nutrition Discussed, Nutrition Reviewed, Fluid intake          SDOH assessments and interventions completed:  No     Care Coordination Interventions:  Yes, provided   Follow up plan: Follow up call scheduled for 01/03/23 @12 :30 PM    Encounter Outcome:  Patient Visit Completed

## 2022-12-05 DIAGNOSIS — I2510 Atherosclerotic heart disease of native coronary artery without angina pectoris: Secondary | ICD-10-CM | POA: Diagnosis not present

## 2022-12-05 DIAGNOSIS — E785 Hyperlipidemia, unspecified: Secondary | ICD-10-CM | POA: Diagnosis not present

## 2022-12-05 DIAGNOSIS — E1169 Type 2 diabetes mellitus with other specified complication: Secondary | ICD-10-CM | POA: Diagnosis not present

## 2022-12-05 DIAGNOSIS — E669 Obesity, unspecified: Secondary | ICD-10-CM | POA: Diagnosis not present

## 2022-12-05 DIAGNOSIS — N179 Acute kidney failure, unspecified: Secondary | ICD-10-CM | POA: Diagnosis not present

## 2022-12-05 DIAGNOSIS — E1165 Type 2 diabetes mellitus with hyperglycemia: Secondary | ICD-10-CM | POA: Diagnosis not present

## 2022-12-05 DIAGNOSIS — I160 Hypertensive urgency: Secondary | ICD-10-CM | POA: Diagnosis not present

## 2022-12-05 DIAGNOSIS — I130 Hypertensive heart and chronic kidney disease with heart failure and stage 1 through stage 4 chronic kidney disease, or unspecified chronic kidney disease: Secondary | ICD-10-CM | POA: Diagnosis not present

## 2022-12-05 DIAGNOSIS — Z48815 Encounter for surgical aftercare following surgery on the digestive system: Secondary | ICD-10-CM | POA: Diagnosis not present

## 2022-12-05 DIAGNOSIS — I5033 Acute on chronic diastolic (congestive) heart failure: Secondary | ICD-10-CM | POA: Diagnosis not present

## 2022-12-05 DIAGNOSIS — Z7984 Long term (current) use of oral hypoglycemic drugs: Secondary | ICD-10-CM | POA: Diagnosis not present

## 2022-12-05 DIAGNOSIS — Z6837 Body mass index (BMI) 37.0-37.9, adult: Secondary | ICD-10-CM | POA: Diagnosis not present

## 2022-12-05 DIAGNOSIS — E1122 Type 2 diabetes mellitus with diabetic chronic kidney disease: Secondary | ICD-10-CM | POA: Diagnosis not present

## 2022-12-05 DIAGNOSIS — N184 Chronic kidney disease, stage 4 (severe): Secondary | ICD-10-CM | POA: Diagnosis not present

## 2022-12-05 DIAGNOSIS — I442 Atrioventricular block, complete: Secondary | ICD-10-CM | POA: Diagnosis not present

## 2022-12-05 DIAGNOSIS — I447 Left bundle-branch block, unspecified: Secondary | ICD-10-CM | POA: Diagnosis not present

## 2022-12-10 DIAGNOSIS — E1122 Type 2 diabetes mellitus with diabetic chronic kidney disease: Secondary | ICD-10-CM | POA: Diagnosis not present

## 2022-12-10 DIAGNOSIS — I5033 Acute on chronic diastolic (congestive) heart failure: Secondary | ICD-10-CM | POA: Diagnosis not present

## 2022-12-10 DIAGNOSIS — E1165 Type 2 diabetes mellitus with hyperglycemia: Secondary | ICD-10-CM | POA: Diagnosis not present

## 2022-12-10 DIAGNOSIS — I130 Hypertensive heart and chronic kidney disease with heart failure and stage 1 through stage 4 chronic kidney disease, or unspecified chronic kidney disease: Secondary | ICD-10-CM | POA: Diagnosis not present

## 2022-12-10 DIAGNOSIS — I442 Atrioventricular block, complete: Secondary | ICD-10-CM | POA: Diagnosis not present

## 2022-12-10 DIAGNOSIS — I160 Hypertensive urgency: Secondary | ICD-10-CM | POA: Diagnosis not present

## 2022-12-10 DIAGNOSIS — Z7984 Long term (current) use of oral hypoglycemic drugs: Secondary | ICD-10-CM | POA: Diagnosis not present

## 2022-12-10 DIAGNOSIS — N184 Chronic kidney disease, stage 4 (severe): Secondary | ICD-10-CM | POA: Diagnosis not present

## 2022-12-10 DIAGNOSIS — Z48815 Encounter for surgical aftercare following surgery on the digestive system: Secondary | ICD-10-CM | POA: Diagnosis not present

## 2022-12-10 DIAGNOSIS — N179 Acute kidney failure, unspecified: Secondary | ICD-10-CM | POA: Diagnosis not present

## 2022-12-10 DIAGNOSIS — E785 Hyperlipidemia, unspecified: Secondary | ICD-10-CM | POA: Diagnosis not present

## 2022-12-10 DIAGNOSIS — E669 Obesity, unspecified: Secondary | ICD-10-CM | POA: Diagnosis not present

## 2022-12-10 DIAGNOSIS — I447 Left bundle-branch block, unspecified: Secondary | ICD-10-CM | POA: Diagnosis not present

## 2022-12-10 DIAGNOSIS — I2510 Atherosclerotic heart disease of native coronary artery without angina pectoris: Secondary | ICD-10-CM | POA: Diagnosis not present

## 2022-12-10 DIAGNOSIS — Z6837 Body mass index (BMI) 37.0-37.9, adult: Secondary | ICD-10-CM | POA: Diagnosis not present

## 2022-12-10 DIAGNOSIS — E1169 Type 2 diabetes mellitus with other specified complication: Secondary | ICD-10-CM | POA: Diagnosis not present

## 2022-12-18 DIAGNOSIS — N184 Chronic kidney disease, stage 4 (severe): Secondary | ICD-10-CM | POA: Diagnosis not present

## 2022-12-18 DIAGNOSIS — I130 Hypertensive heart and chronic kidney disease with heart failure and stage 1 through stage 4 chronic kidney disease, or unspecified chronic kidney disease: Secondary | ICD-10-CM | POA: Diagnosis not present

## 2022-12-18 DIAGNOSIS — E1165 Type 2 diabetes mellitus with hyperglycemia: Secondary | ICD-10-CM | POA: Diagnosis not present

## 2022-12-18 DIAGNOSIS — I442 Atrioventricular block, complete: Secondary | ICD-10-CM | POA: Diagnosis not present

## 2022-12-18 DIAGNOSIS — I160 Hypertensive urgency: Secondary | ICD-10-CM | POA: Diagnosis not present

## 2022-12-18 DIAGNOSIS — Z48815 Encounter for surgical aftercare following surgery on the digestive system: Secondary | ICD-10-CM | POA: Diagnosis not present

## 2022-12-18 DIAGNOSIS — E1122 Type 2 diabetes mellitus with diabetic chronic kidney disease: Secondary | ICD-10-CM | POA: Diagnosis not present

## 2022-12-18 DIAGNOSIS — I5033 Acute on chronic diastolic (congestive) heart failure: Secondary | ICD-10-CM | POA: Diagnosis not present

## 2022-12-18 DIAGNOSIS — N179 Acute kidney failure, unspecified: Secondary | ICD-10-CM | POA: Diagnosis not present

## 2022-12-18 DIAGNOSIS — I447 Left bundle-branch block, unspecified: Secondary | ICD-10-CM | POA: Diagnosis not present

## 2022-12-18 DIAGNOSIS — I2510 Atherosclerotic heart disease of native coronary artery without angina pectoris: Secondary | ICD-10-CM | POA: Diagnosis not present

## 2022-12-18 DIAGNOSIS — E669 Obesity, unspecified: Secondary | ICD-10-CM | POA: Diagnosis not present

## 2022-12-18 DIAGNOSIS — E785 Hyperlipidemia, unspecified: Secondary | ICD-10-CM | POA: Diagnosis not present

## 2022-12-18 DIAGNOSIS — Z7984 Long term (current) use of oral hypoglycemic drugs: Secondary | ICD-10-CM | POA: Diagnosis not present

## 2022-12-18 DIAGNOSIS — E1169 Type 2 diabetes mellitus with other specified complication: Secondary | ICD-10-CM | POA: Diagnosis not present

## 2022-12-18 DIAGNOSIS — Z6837 Body mass index (BMI) 37.0-37.9, adult: Secondary | ICD-10-CM | POA: Diagnosis not present

## 2023-01-03 ENCOUNTER — Ambulatory Visit: Payer: Self-pay

## 2023-01-03 NOTE — Patient Outreach (Signed)
Care Coordination   Follow Up Visit Note   01/03/2023 Name: KARLI WESTBERRY MRN: [id number] DOB: 12-26-1940  DENETRA HIRATA is a 82 y.o. year old female who sees [name], Adelfa Koh, MD for primary care. I spoke with  Marcelyn Ditty by phone today.  What matters to the patients health and wellness today?  Patient would like to improve self health management of her CHF by checking her weights daily and taking her diuretic as directed.     Goals Addressed               This Visit's Progress     Patient Stated     I am checking my weights daily (pt-stated)   On track     Care Coordination Interventions: Basic overview and discussion of pathophysiology of Heart Failure reviewed Reviewed Heart Failure Action Plan in depth and provided written copy Assessed need for readable accurate scales in home Provided education about placing scale on hard, flat surface Advised patient to weigh each morning after emptying bladder Discussed importance of daily weight and advised patient to weigh and record daily Reviewed role of diuretics in prevention of fluid overload and management of heart failure; Reviewed scheduled/upcoming provider appointment with Dr. Odis Hollingshead, Cardiologist, scheduled for 03/11/23 @8 :20 AM Discussed plans with patient for ongoing care coordination follow up and provided patient with direct contact information for nurse care coordinator        Other     To improve kidney function   On track     Care Coordination Interventions: Assessed the Patient understanding of chronic kidney disease    Evaluation of current treatment plan related to chronic kidney disease self management and patient's adherence to plan as established by provider      Reviewed prescribed diet increase daily water intake to 48-64 oz daily unless otherwise directed by your doctor  Advised patient to discuss decreased GFR with provider    Provided education on kidney disease progression    Reviewed scheduled/upcoming  provider appointment with Dr. Allena Katz, Nephrologist scheduled for 02/01/23 Discussed plans with patient for ongoing care coordination follow up and provided patient with direct contact information for nurse care coordinator      COMPLETED: To recover from recent gall bladder removal        Care Coordination Interventions: Evaluation of current treatment plan related to laparoscopic Cholecystectomy   and patient's adherence to plan as established by provider Discussed with patient she feels she has made a full recovery from having recent laparoscopic Cholecystectomy Instructed patient to contact her doctor with new symptoms or concerns      Interventions Today    Flowsheet Row Most Recent Value  Chronic Disease   Chronic disease during today's visit Congestive Heart Failure (CHF), Chronic Kidney Disease/End Stage Renal Disease (ESRD)  General Interventions   General Interventions Discussed/Reviewed General Interventions Discussed, General Interventions Reviewed, Doctor Visits, Labs  Doctor Visits Discussed/Reviewed Doctor Visits Discussed, Doctor Visits Reviewed, Specialist  Education Interventions   Education Provided Provided Education  Provided Verbal Education On Labs, Medication, When to see the doctor  Labs Reviewed Kidney Function  Nutrition Interventions   Nutrition Discussed/Reviewed Nutrition Discussed, Nutrition Reviewed, Fluid intake  Pharmacy Interventions   Pharmacy Dicussed/Reviewed Pharmacy Topics Reviewed, Pharmacy Topics Discussed, Medications and their functions          SDOH assessments and interventions completed:  No     Care Coordination Interventions:  Yes, provided   Follow up plan: Follow up call  scheduled for 02/04/23 @10 :30 AM    Encounter Outcome:  Patient Visit Completed

## 2023-01-03 NOTE — Patient Instructions (Signed)
Visit Information  Thank you for taking time to visit with me today. Please don't hesitate to contact me if I can be of assistance to you.   Following are the goals we discussed today:   Goals Addressed               This Visit's Progress     Patient Stated     I am checking my weights daily (pt-stated)   On track     Care Coordination Interventions: Basic overview and discussion of pathophysiology of Heart Failure reviewed Reviewed Heart Failure Action Plan in depth and provided written copy Assessed need for readable accurate scales in home Provided education about placing scale on hard, flat surface Advised patient to weigh each morning after emptying bladder Discussed importance of daily weight and advised patient to weigh and record daily Reviewed role of diuretics in prevention of fluid overload and management of heart failure; Reviewed scheduled/upcoming provider appointment with Dr. Odis Hollingshead, Cardiologist, scheduled for 03/11/23 @8 :20 AM Discussed plans with patient for ongoing care coordination follow up and provided patient with direct contact information for nurse care coordinator        Other     To improve kidney function   On track     Care Coordination Interventions: Assessed the Patient understanding of chronic kidney disease    Evaluation of current treatment plan related to chronic kidney disease self management and patient's adherence to plan as established by provider      Reviewed prescribed diet increase daily water intake to 48-64 oz daily unless otherwise directed by your doctor  Advised patient to discuss decreased GFR with provider    Provided education on kidney disease progression    Reviewed scheduled/upcoming provider appointment with Dr. Allena Katz, Nephrologist scheduled for 02/01/23 Discussed plans with patient for ongoing care coordination follow up and provided patient with direct contact information for nurse care coordinator      COMPLETED: To recover  from recent gall bladder removal        Care Coordination Interventions: Evaluation of current treatment plan related to laparoscopic Cholecystectomy   and patient's adherence to plan as established by provider Discussed with patient she feels she has made a full recovery from having recent laparoscopic Cholecystectomy Instructed patient to contact her doctor with new symptoms or concerns          Our next appointment is by telephone on 02/04/23 at 10:30 AM  Please call the care guide team at [phone] if you need to cancel or reschedule your appointment.   If you are experiencing a Mental Health or Behavioral Health Crisis or need someone to talk to, please call 1-800-273-TALK (toll free, 24 hour hotline)  Patient verbalizes understanding of instructions and care plan provided today and agrees to view in MyChart. Active MyChart status and patient understanding of how to access instructions and care plan via MyChart confirmed with patient.     Delsa Sale RN BSN CCM [place]  [facility], [facility] Nurse Care Coordinator  Direct Dial: [phone] Website: [email or web address]

## 2023-01-08 ENCOUNTER — Other Ambulatory Visit: Payer: Self-pay | Admitting: Cardiology

## 2023-01-08 DIAGNOSIS — I5032 Chronic diastolic (congestive) heart failure: Secondary | ICD-10-CM

## 2023-01-08 DIAGNOSIS — I10 Essential (primary) hypertension: Secondary | ICD-10-CM

## 2023-01-11 DIAGNOSIS — K080 Exfoliation of teeth due to systemic causes: Secondary | ICD-10-CM | POA: Diagnosis not present

## 2023-01-23 ENCOUNTER — Encounter: Payer: Self-pay | Admitting: Podiatry

## 2023-01-23 ENCOUNTER — Ambulatory Visit (INDEPENDENT_AMBULATORY_CARE_PROVIDER_SITE_OTHER): Payer: Medicare Other | Admitting: Podiatry

## 2023-01-23 DIAGNOSIS — M79674 Pain in right toe(s): Secondary | ICD-10-CM | POA: Diagnosis not present

## 2023-01-23 DIAGNOSIS — M79675 Pain in left toe(s): Secondary | ICD-10-CM | POA: Diagnosis not present

## 2023-01-23 DIAGNOSIS — N184 Chronic kidney disease, stage 4 (severe): Secondary | ICD-10-CM | POA: Diagnosis not present

## 2023-01-23 DIAGNOSIS — E119 Type 2 diabetes mellitus without complications: Secondary | ICD-10-CM

## 2023-01-23 DIAGNOSIS — B351 Tinea unguium: Secondary | ICD-10-CM | POA: Diagnosis not present

## 2023-01-23 NOTE — Progress Notes (Signed)
This patient returns to my office for at risk foot care.  This patient requires this care by a professional since this patient will be at risk due to having diabetes and chronic kidney disease   This patient is unable to cut nails herself since the patient cannot reach her nails.These nails are painful walking and wearing shoes.  This patient presents for at risk foot care today.  General Appearance  Alert, conversant and in no acute stress.  Vascular  Dorsalis pedis and posterior tibial  pulses are palpable  bilaterally.  Capillary return is within normal limits  bilaterally. Temperature is within normal limits  bilaterally.  Neurologic  Senn-Weinstein monofilament wire test within normal limits  bilaterally. Muscle power within normal limits bilaterally.  Nails Thick disfigured discolored nails with subungual debris  Hallux nails  bilaterally. No evidence of bacterial infection or drainage bilaterally.  Orthopedic  No limitations of motion  feet .  No crepitus or effusions noted.  No bony pathology or digital deformities noted.Mild  HAV  B/L.  Skin  normotropic skin with no porokeratosis noted bilaterally.  No signs of infections or ulcers noted.     Onychomycosis  Pain in right toes  Pain in left toes  Consent was obtained for treatment procedures.   Mechanical debridement of nails 1-5  bilaterally performed with a nail nipper.  Filed with dremel without incident.    Return office visit  10 weeks                   Told patient to return for periodic foot care and evaluation due to potential at risk complications.   Gardiner Barefoot DPM

## 2023-01-25 ENCOUNTER — Telehealth: Payer: Self-pay | Admitting: Cardiology

## 2023-01-25 NOTE — Telephone Encounter (Signed)
Pt c/o medication issue:  1. Name of Medication: empagliflozin (JARDIANCE) 10 MG TABS tablet   2. How are you currently taking this medication (dosage and times per day)?    3. Are you having a reaction (difficulty breathing--STAT)? no  4. What is your medication issue? Patient insurance company [employer]) provide with a pharmacy that we allow her to get medication at a discount. Is through [facility] [phone] BEN #[id number]. Please advise

## 2023-01-28 ENCOUNTER — Other Ambulatory Visit (HOSPITAL_COMMUNITY): Payer: Self-pay

## 2023-01-28 DIAGNOSIS — D631 Anemia in chronic kidney disease: Secondary | ICD-10-CM | POA: Diagnosis not present

## 2023-01-28 DIAGNOSIS — I130 Hypertensive heart and chronic kidney disease with heart failure and stage 1 through stage 4 chronic kidney disease, or unspecified chronic kidney disease: Secondary | ICD-10-CM | POA: Diagnosis not present

## 2023-01-28 DIAGNOSIS — E1129 Type 2 diabetes mellitus with other diabetic kidney complication: Secondary | ICD-10-CM | POA: Diagnosis not present

## 2023-01-28 DIAGNOSIS — N184 Chronic kidney disease, stage 4 (severe): Secondary | ICD-10-CM | POA: Diagnosis not present

## 2023-01-28 NOTE — Telephone Encounter (Signed)
Pharmacy Patient Advocate Encounter   Insurance verification completed.   The patient is insured through [employer]  .   Per test claim: The current 30 day co-pay is, $45.  No PA needed at this time. This test claim was processed through [facility]- copay amounts may vary at other pharmacies due to pharmacy/plan contracts, or as the patient moves through the different stages of their insurance plan.   *Patient will need to contact the number on the back of their insurance card to determine which pharmacies are preferred on their insurance plan.

## 2023-01-30 ENCOUNTER — Other Ambulatory Visit: Payer: Self-pay

## 2023-01-30 DIAGNOSIS — I5032 Chronic diastolic (congestive) heart failure: Secondary | ICD-10-CM

## 2023-01-30 MED ORDER — EMPAGLIFLOZIN 10 MG PO TABS: 10.0000 mg | ORAL_TABLET | Freq: Every day | ORAL | 3 refills | Status: DC

## 2023-01-30 NOTE — Progress Notes (Signed)
Refill for Jardiance sent to new pharmacy at request of pt.

## 2023-01-30 NOTE — Telephone Encounter (Signed)
Spoke with pt over the phone and she stated that she spoke with her insurance and she wanted her Jardiance switched to one of the preferred pharmacies with her insurance for financial reasons. Refill of medication sent to new pharmacy at request of pt. Pt is aware. All questions were answered.

## 2023-01-30 NOTE — Telephone Encounter (Signed)
Patient is calling again with further questions in regards to this medication and expense. Please advise.

## 2023-02-01 DIAGNOSIS — I129 Hypertensive chronic kidney disease with stage 1 through stage 4 chronic kidney disease, or unspecified chronic kidney disease: Secondary | ICD-10-CM | POA: Diagnosis not present

## 2023-02-01 DIAGNOSIS — N2581 Secondary hyperparathyroidism of renal origin: Secondary | ICD-10-CM | POA: Diagnosis not present

## 2023-02-01 DIAGNOSIS — D631 Anemia in chronic kidney disease: Secondary | ICD-10-CM | POA: Diagnosis not present

## 2023-02-01 DIAGNOSIS — N1832 Chronic kidney disease, stage 3b: Secondary | ICD-10-CM | POA: Diagnosis not present

## 2023-02-01 DIAGNOSIS — N189 Chronic kidney disease, unspecified: Secondary | ICD-10-CM | POA: Diagnosis not present

## 2023-02-04 ENCOUNTER — Ambulatory Visit: Payer: Self-pay

## 2023-02-04 NOTE — Patient Outreach (Signed)
  Care Coordination   Follow Up Visit Note   02/04/2023 Name: Connie Swanson MRN: [id number] DOB: 05/29/1940  Connie Swanson is a 83 y.o. year old female who sees [name], Adelfa Koh, MD for primary care. I spoke with  Marcelyn Ditty by phone today.  What matters to the patients health and wellness today?  Patient would like to start using her preferred mail order pharmacy.     Goals Addressed               This Visit's Progress     Patient Stated     COMPLETED: I am checking my weights daily (pt-stated)        Care Coordination Interventions: Basic overview and discussion of pathophysiology of Heart Failure reviewed Reviewed Heart Failure Action Plan in depth  Review of patient status, including review of consultant's reports, relevant laboratory and other test results, and medications completed Assessed need for readable accurate scales in home Provided education about placing scale on hard, flat surface Advised patient to weigh each morning after emptying bladder Discussed importance of daily weight and advised patient to weigh and record daily Reviewed role of diuretics in prevention of fluid overload and management of heart failure; Reviewed scheduled/upcoming provider appointment with Dr. Odis Hollingshead, Cardiologist, scheduled for 03/11/23 @8 :20 AM Advised patient of nurse care coordination case closure per PCP provider request and patient verbalizes understanding       Other     COMPLETED: To improve kidney function        Care Coordination Interventions: Assessed the Patient understanding of chronic kidney disease    Evaluation of current treatment plan related to chronic kidney disease self management and patient's adherence to plan as established by provider      Review of patient status, including review of consultant's reports, relevant laboratory and other test results, and medications completed Determined Dr. Allena Katz with [facility] continues to manage patient's CKD Reinforced  the importance to continue to stay well hydrated with water as directed Advised patient of nurse care coordination case closure per PCP provider request and patient verbalizes understanding     Interventions Today    Flowsheet Row Most Recent Value  Chronic Disease   Chronic disease during today's visit Congestive Heart Failure (CHF), Diabetes, Chronic Kidney Disease/End Stage Renal Disease (ESRD)  General Interventions   General Interventions Discussed/Reviewed General Interventions Discussed, General Interventions Reviewed, Labs, Doctor Visits  Doctor Visits Discussed/Reviewed Doctor Visits Discussed, Doctor Visits Reviewed, PCP, Specialist  Education Interventions   Education Provided Provided Education  Provided Verbal Education On When to see the doctor, Labs, Medication  Labs Reviewed Kidney Function, Hgb A1c  Pharmacy Interventions   Pharmacy Dicussed/Reviewed Pharmacy Topics Reviewed, Pharmacy Topics Discussed, Medications and their functions, Affording Medications          SDOH assessments and interventions completed:  Yes  SDOH Interventions Today    Flowsheet Row Most Recent Value  SDOH Interventions   Food Insecurity Interventions Intervention Not Indicated  Housing Interventions Intervention Not Indicated  Utilities Interventions Intervention Not Indicated        Care Coordination Interventions:  Yes, provided   Follow up plan: No further intervention required.   Encounter Outcome:  Patient Visit Completed

## 2023-02-04 NOTE — Patient Instructions (Signed)
Visit Information  Thank you for taking time to visit with me today. Please don't hesitate to contact me if I can be of assistance to you.   Following are the goals we discussed today:   Goals Addressed               This Visit's Progress     Patient Stated     COMPLETED: I am checking my weights daily (pt-stated)        Care Coordination Interventions: Basic overview and discussion of pathophysiology of Heart Failure reviewed Reviewed Heart Failure Action Plan in depth  Review of patient status, including review of consultant's reports, relevant laboratory and other test results, and medications completed Assessed need for readable accurate scales in home Provided education about placing scale on hard, flat surface Advised patient to weigh each morning after emptying bladder Discussed importance of daily weight and advised patient to weigh and record daily Reviewed role of diuretics in prevention of fluid overload and management of heart failure; Reviewed scheduled/upcoming provider appointment with Dr. Odis Hollingshead, Cardiologist, scheduled for 03/11/23 @8 :20 AM Advised patient of nurse care coordination case closure per PCP provider request and patient verbalizes understanding       Other     COMPLETED: To improve kidney function        Care Coordination Interventions: Assessed the Patient understanding of chronic kidney disease    Evaluation of current treatment plan related to chronic kidney disease self management and patient's adherence to plan as established by provider      Review of patient status, including review of consultant's reports, relevant laboratory and other test results, and medications completed Determined Dr. Allena Katz with [facility] continues to manage patient's CKD Reinforced the importance to continue to stay well hydrated with water as directed Advised patient of nurse care coordination case closure per PCP provider request and patient verbalizes understanding          If you are experiencing a Mental Health or Behavioral Health Crisis or need someone to talk to, please call 1-800-273-TALK (toll free, 24 hour hotline)  Patient verbalizes understanding of instructions and care plan provided today and agrees to view in MyChart. Active MyChart status and patient understanding of how to access instructions and care plan via MyChart confirmed with patient.     Delsa Sale RN BSN CCM [place]  [facility], [facility] Health Nurse Care Coordinator  Direct Dial: [phone] Website: [email or web address]

## 2023-02-22 ENCOUNTER — Ambulatory Visit: Payer: Self-pay | Admitting: Cardiology

## 2023-03-08 ENCOUNTER — Telehealth: Payer: Self-pay | Admitting: Cardiology

## 2023-03-08 DIAGNOSIS — I5033 Acute on chronic diastolic (congestive) heart failure: Secondary | ICD-10-CM

## 2023-03-08 DIAGNOSIS — I130 Hypertensive heart and chronic kidney disease with heart failure and stage 1 through stage 4 chronic kidney disease, or unspecified chronic kidney disease: Secondary | ICD-10-CM

## 2023-03-08 NOTE — Telephone Encounter (Signed)
 She has not had any recent labs in the last 3 months recommend checking CMP, BNP, Mg, H&H.   If she has recent labs -please arrange them for the upcoming office visit.  Tessa Lerner, [facility]

## 2023-03-08 NOTE — Telephone Encounter (Signed)
 Pt called in asking if she should get some labs drawn today before her appt Monday. Please advise.

## 2023-03-08 NOTE — Telephone Encounter (Signed)
 LMTCB to go over labs and where to go to get them completed.  CMP, Pro-BNP, Mg, and H&H ordered and released in the system to be completed prior to office visit with Dr. Odis Hollingshead.

## 2023-03-11 ENCOUNTER — Telehealth: Payer: Self-pay

## 2023-03-11 ENCOUNTER — Ambulatory Visit: Payer: Medicare Other | Attending: Cardiology | Admitting: Cardiology

## 2023-03-11 ENCOUNTER — Encounter: Payer: Self-pay | Admitting: Cardiology

## 2023-03-11 VITALS — BP 130/82 | HR 75 | Resp 16 | Ht 61.0 in | Wt 198.8 lb

## 2023-03-11 DIAGNOSIS — I5032 Chronic diastolic (congestive) heart failure: Secondary | ICD-10-CM | POA: Diagnosis not present

## 2023-03-11 DIAGNOSIS — I442 Atrioventricular block, complete: Secondary | ICD-10-CM | POA: Diagnosis not present

## 2023-03-11 DIAGNOSIS — I2510 Atherosclerotic heart disease of native coronary artery without angina pectoris: Secondary | ICD-10-CM | POA: Diagnosis not present

## 2023-03-11 DIAGNOSIS — E782 Mixed hyperlipidemia: Secondary | ICD-10-CM

## 2023-03-11 DIAGNOSIS — Z950 Presence of cardiac pacemaker: Secondary | ICD-10-CM

## 2023-03-11 DIAGNOSIS — I130 Hypertensive heart and chronic kidney disease with heart failure and stage 1 through stage 4 chronic kidney disease, or unspecified chronic kidney disease: Secondary | ICD-10-CM | POA: Diagnosis not present

## 2023-03-11 DIAGNOSIS — R001 Bradycardia, unspecified: Secondary | ICD-10-CM | POA: Diagnosis not present

## 2023-03-11 DIAGNOSIS — N184 Chronic kidney disease, stage 4 (severe): Secondary | ICD-10-CM | POA: Diagnosis not present

## 2023-03-11 DIAGNOSIS — E1165 Type 2 diabetes mellitus with hyperglycemia: Secondary | ICD-10-CM

## 2023-03-11 DIAGNOSIS — Z794 Long term (current) use of insulin: Secondary | ICD-10-CM

## 2023-03-11 DIAGNOSIS — I5033 Acute on chronic diastolic (congestive) heart failure: Secondary | ICD-10-CM | POA: Diagnosis not present

## 2023-03-11 MED ORDER — EMPAGLIFLOZIN 10 MG PO TABS: 10.0000 mg | ORAL_TABLET | Freq: Every day | ORAL | 3 refills | Status: DC

## 2023-03-11 MED ORDER — EMPAGLIFLOZIN 10 MG PO TABS: 10.0000 mg | ORAL_TABLET | Freq: Every day | ORAL | Status: DC

## 2023-03-11 NOTE — Telephone Encounter (Signed)
 Thanks for the information - please follow her in the device clinic.   Shelsy Seng [place], DO, [facility]

## 2023-03-11 NOTE — Telephone Encounter (Signed)
 Connie Swanson

## 2023-03-11 NOTE — Progress Notes (Signed)
 Cardiology Office Note:  .   Date:  03/11/2023  ID:  Connie Swanson, DOB June 13, 1940, MRN [id number] PCP:  Gaspar Garbe, MD  Nephrologist: Dr. Rachael Fee Cardiology Providers: Dr. Yates Decamp  [facility] Providers Cardiologist:  Tessa Lerner, DO, [facility] (established care 04/27/2020 )  Electrophysiologist:  Lanier Prude, MD  Electrophysiologist:  Lanier Prude, MD  Click to update primary MD,subspecialty MD or APP then REFRESH:1}    Chief Complaint  Patient presents with   Chronic heart failure with preserved ejection fraction   Follow-up    6 months    History of Present Illness: .   Connie Swanson is a 83 y.o. female whose past medical history and cardiovascular risk factors includes: Intermittent complete heart block status post pacemaker implantation (10/2021), HFpEF, single-vessel CAD (nonobstructive), insulin-dependent diabetes mellitus type 2, chronic kidney disease stage IIIb/IV, hypertension, history of ischemic colitis, hypothyroidism, obesity due to excess calories, vertigo, history of COVID-19 infection (January 2022), postmenopausal female, advanced age.   Patient being followed by the practice given her history of bradycardia/intermittent complete heart block status post pacemaker implant and HFpEF.  She presents today for 72-month follow-up visit.  Patient denies anginal chest pain or heart failure symptoms.  She does have dyspnea on exertion which is chronic and stable.  And she has lower extremity swelling which usually resolves with leg elevation.  She continues to use Bumex and as needed basis given her chronic kidney disease have avoided daily uses diuretics.  She has lost weight since last office visit for which she is congratulated for at today's visit.  Her pacemaker device data is not available at the current time.  Review of Systems: .   Review of Systems  Constitutional: Positive for weight loss.  Cardiovascular:  Positive for dyspnea on  exertion (chronic). Negative for chest pain, claudication, irregular heartbeat, leg swelling, near-syncope, orthopnea, palpitations, paroxysmal nocturnal dyspnea and syncope.  Respiratory:  Negative for shortness of breath.   Hematologic/Lymphatic: Negative for bleeding problem.    Studies Reviewed:   Pacemaker:  10/24/2021:  Dual chamber permanent pacemaker with left bundle area lead. [employer] Assurity MRI Implant 10/24/2021, by Dr. Lalla Brothers.    Remote dual-chamber pacemaker transmission 07/25/2022: Longevity 8 years and 0 months.  AP 3.4 %, VP 99%.  Lead impedance and thresholds are normal.  There are no high rate episodes.  Normal pacemaker function.  EKG: EKG Interpretation Date/Time:  Monday March 11 2023 08:30:09 EST Ventricular Rate:  73 PR Interval:    QRS Duration:  156 QT Interval:  462 QTC Calculation: 508 R Axis:   -50  Text Interpretation: Sinus rhythm Left axis deviation Left bundle branch block When compared with ECG of 21-Oct-2022 02:05, Sinus rhythm has replaced atrial sensed ventricular paced rhythm Confirmed by Tessa Lerner [phone]) on 03/11/2023 8:37:30 AM  Echocardiogram: 05/04/2020:LVEF 50-55%  10/22/2022  1. Left ventricular ejection fraction, by estimation, is 70 to 75%. The  left ventricle has hyperdynamic function. The left ventricle has no  regional wall motion abnormalities. There is moderate concentric left  ventricular hypertrophy. Left ventricular  diastolic parameters are indeterminate.   2. Right ventricular systolic function is normal. The right ventricular  size is mildly enlarged. There is moderately elevated pulmonary artery  systolic pressure. The estimated right ventricular systolic pressure is  54.0 mmHg.   3. Left atrial size was mild to moderately dilated.   4. Right atrial size was mildly dilated.   5. The mitral valve  is degenerative. No evidence of mitral valve  regurgitation. No evidence of mitral stenosis. Moderate mitral annular   calcification.   6. The aortic valve is tricuspid. There is mild calcification of the  aortic valve. Aortic valve regurgitation is not visualized. Aortic valve  sclerosis/calcification is present, without any evidence of aortic  stenosis. Aortic valve area, by VTI measures   2.80 cm. Aortic valve mean gradient measures 8.0 mmHg. Aortic valve Vmax  measures 2.11 m/s.   7. The inferior vena cava is normal in size with greater than 50%  respiratory variability, suggesting right atrial pressure of 3 mmHg.    Stress Testing: Lexiscan Tetrofosmin stress test 05/11/2020: Lexiscan nuclear stress test performed using 1-day protocol. SPECT images show uniform breast tissue attenuation in both rest and stress images. In addition, there is medium sized, medium intensity, fixed perfusion defect in basal inferior/inferoseptal myocardium. All segments of left ventricle demonstrated septal dyskinesis, global hypokinesis. Stress LVEF 34%. High risk study due to low stress LVEF.   Heart Catheterization: 10/10/2010: HEMODYNAMIC DATA:  Right heart catheterization:  RA pressure 7/7, mean 6 mmHg.  RA saturation 76%.   RV pressure 24/1, end-diastolic pressure 4 mmHg.   PA pressure 27/40 with a mean of 21 mmHg.  PA saturation was 75%.   Pulmonary capillary wedge 6/5 with a mean of 3 mmHg.  Aortic saturation was 100%.   Cardiac output was 6.4 with a cardiac index of 3.34 by Fick.   Right coronary artery:  Right coronary artery is a large caliber and superdominant vessel.  Gives origin to large PL branch and 2 PDA large branches.  Smooth and normal.   Left main coronary artery:  Left main coronary artery is a large-caliber vessel, which is smooth and normal.   Circumflex coronary artery:  Circumflex coronary artery is a very large- caliber vessel.  Gives origin to a moderate-sized obtuse marginal 1 and continues distally as a large obtuse marginal 2.  Smooth and normal.   LAD:  LAD is a  large-caliber vessel in the proximal segment.  Gives origin to a moderate-sized diagonal 1 and then gives origin to a very large diagonal 2, which is bigger than the LAD itself.  Between diagonal 1 and diagonal 2, there is a eccentric 50% stenosis noted in the LAD. The diagonal 2, which is very large has a ostial 30% stenosis. Otherwise, except for this focal stenosis of 50%, there is no other lesion noted in the LAD or the large diagonal 2.  RADIOLOGY: NA  Risk Assessment/Calculations:   N/A   Labs:       Latest Ref Rng & Units 10/25/2022    7:02 AM 10/24/2022    6:59 AM 10/23/2022    6:32 AM  CBC  WBC 4.0 - 10.5 K/uL 8.9  9.7  13.2   Hemoglobin 12.0 - 15.0 g/dL 82.9  56.2  13.0   Hematocrit 36.0 - 46.0 % 33.2  32.8  34.8   Platelets 150 - 400 K/uL 233  228  234        Latest Ref Rng & Units 10/26/2022    9:21 AM 10/25/2022    7:02 AM 10/24/2022    6:59 AM  BMP  Glucose 70 - 99 mg/dL 865  784  696   BUN 8 - 23 mg/dL 53  56  53   Creatinine 0.44 - 1.00 mg/dL 2.95  2.84  1.32   Sodium 135 - 145 mmol/L 137  139  135  Potassium 3.5 - 5.1 mmol/L 4.6  5.5  4.6   Chloride 98 - 111 mmol/L 105  107  102   CO2 22 - 32 mmol/L 21  20  21    Calcium 8.9 - 10.3 mg/dL 8.5  8.7  8.3       Latest Ref Rng & Units 10/26/2022    9:21 AM 10/25/2022    7:02 AM 10/24/2022    6:59 AM  CMP  Glucose 70 - 99 mg/dL 161  096  045   BUN 8 - 23 mg/dL 53  56  53   Creatinine 0.44 - 1.00 mg/dL 4.09  8.11  9.14   Sodium 135 - 145 mmol/L 137  139  135   Potassium 3.5 - 5.1 mmol/L 4.6  5.5  4.6   Chloride 98 - 111 mmol/L 105  107  102   CO2 22 - 32 mmol/L 21  20  21    Calcium 8.9 - 10.3 mg/dL 8.5  8.7  8.3   Total Protein 6.5 - 8.1 g/dL  6.0  5.9   Total Bilirubin 0.3 - 1.2 mg/dL  0.8  1.0   Alkaline Phos 38 - 126 U/L  407  429   AST 15 - 41 U/L  107  150   ALT 0 - 44 U/L  213  290     Lab Results  Component Value Date   CHOL 129 10/24/2021   HDL 25 (L) 10/24/2021   LDLCALC 73  10/24/2021   TRIG 155 (H) 10/24/2021   CHOLHDL 5.2 10/24/2021   No results for input(s): "LIPOA" in the last 8760 hours. No components found for: "NTPROBNP" Recent Labs    03/22/22 1048 05/17/22 1406  PROBNP 1,810* 925*   No results for input(s): "TSH" in the last 8760 hours.   Physical Exam:    Today's Vitals   03/11/23 0825  BP: 130/82  Pulse: 75  Resp: 16  SpO2: 97%  Weight: 198 lb 12.8 oz (90.2 kg)  Height: 5\' 1"  (1.549 m)   Body mass index is 37.56 kg/m. Wt Readings from Last 3 Encounters:  03/11/23 198 lb 12.8 oz (90.2 kg)  10/26/22 (!) 346 lb 9 oz (157.2 kg)  08/21/22 203 lb 9.6 oz (92.4 kg)    Physical Exam  Constitutional: No distress.  Age appropriate, hemodynamically stable.   Neck: No JVD present.  Cardiovascular: Normal rate, regular rhythm, S1 normal, S2 normal, intact distal pulses and normal pulses. Exam reveals no gallop, no S3 and no S4.  No murmur heard. Pulmonary/Chest: Effort normal and breath sounds normal. No stridor. She has no wheezes. She has no rales.  Pacemaker in situ  Abdominal: Soft. Bowel sounds are normal. She exhibits no distension. There is no abdominal tenderness.  Abdominal obesity  Musculoskeletal:        General: No edema.     Cervical back: Neck supple.  Neurological: She is alert and oriented to person, place, and time. She has intact cranial nerves (2-12).  Skin: Skin is warm and moist.     Impression & Recommendation(s):  Impression:   ICD-10-CM   1. Chronic heart failure with preserved ejection fraction (HCC)  I50.32 EKG 12-Lead    empagliflozin (JARDIANCE) 10 MG TABS tablet    DISCONTINUED: empagliflozin (JARDIANCE) 10 MG TABS tablet    2. Nonobstructive atherosclerosis of coronary artery  I25.10     3. Symptomatic bradycardia  R00.1     4. Complete heart block, transient (HCC)  I44.2  5. Cardiac pacemaker in situ  Z95.0     6. Type 2 diabetes mellitus with hyperglycemia, with long-term current use of  insulin (HCC)  E11.65    Z79.4     7. Mixed hyperlipidemia  E78.2        Recommendation(s):  Chronic heart failure with preserved ejection fraction (HCC) Stage B, NYHA class II/III. Has lost 5 pounds at last office visit. Ulysees Barns has been discontinued in the past due to worsening of renal function and hyperkalemia. Remains on Bumex on as needed basis given her chronic kidney disease. Refilled Jardiance 10 mg p.o. every morning, 90-day supply. Continue hydralazine 25 mg p.o. 3 times daily continue Imdur 30 g. Continue Toprol-XL 50 mg p.o. daily Labs are currently pending. If renal function is stable will restart losartan for renal protection and HFpEF.  Nonobstructive atherosclerosis of coronary artery Denies anginal chest pain. EKG does not illustrate evidence of cardial injury pattern. Reemphasized the importance of secondary prevention with focus on improving her modifiable cardiovascular risk factors such as glycemic control, lipid management, blood pressure control, weight loss.  Symptomatic bradycardia Complete heart block, transient (HCC) Cardiac pacemaker in situ No recent pacemaker data available for review. I did coordinate care with the device clinic and they do have her pacemaker data and will start transmitting. Will need to be followed up.  Type 2 diabetes mellitus with hyperglycemia, with long-term current use of insulin (HCC) Reemphasized importance of glycemic control. Currently on Jardiance and statin therapy  Mixed hyperlipidemia Currently on Lipitor 40 mg p.o. daily.   She denies myalgia or other side effects. Most recent lipids dated 10/10/18 24K PN database, independently reviewed 69 mg.  LDL 69 mg/dL.  Orders Placed:  Orders Placed This Encounter  Procedures   EKG 12-Lead    Final Medication List:    Meds ordered this encounter  Medications   DISCONTD: empagliflozin (JARDIANCE) 10 MG TABS tablet    Sig: Take 1 tablet (10 mg total) by mouth daily  before breakfast.    Dispense:  90 tablet    Refill:  3   empagliflozin (JARDIANCE) 10 MG TABS tablet    Sig: Take 1 tablet (10 mg total) by mouth daily before breakfast.    Medications Discontinued During This Encounter  Medication Reason   empagliflozin (JARDIANCE) 10 MG TABS tablet Reorder   empagliflozin (JARDIANCE) 10 MG TABS tablet      Current Outpatient Medications:    acetaminophen (TYLENOL) 325 MG tablet, Take 2 tablets (650 mg total) by mouth every 4 (four) hours as needed for headache or mild pain., Disp: , Rfl:    allopurinol (ZYLOPRIM) 300 MG tablet, Take 300 mg by mouth daily., Disp: , Rfl:    atorvastatin (LIPITOR) 40 MG tablet, Take 40 mg by mouth daily., Disp: , Rfl:    B-D INS SYR ULTRAFINE 1CC/30G 30G X 1/2" 1 ML MISC, USE SYRINGE AS DIRECTED TWICE DAILY, Disp: , Rfl:    bumetanide (BUMEX) 0.5 MG tablet, Take 1 tablet (0.5 mg total) by mouth daily as needed., Disp: 90 tablet, Rfl: 0   Cholecalciferol (VITAMIN D3) 125 MCG (5000 UT) TABS, Take 5,000 Units by mouth daily at 12 noon., Disp: , Rfl:    denosumab (PROLIA) 60 MG/ML SOSY injection, Inject 60 mg into the skin every 6 (six) months., Disp: , Rfl:    hydrALAZINE (APRESOLINE) 25 MG tablet, TAKE 1 TABLET BY MOUTH THREE TIMES DAILY, Disp: 270 tablet, Rfl: 2   isosorbide mononitrate (IMDUR) 30 MG  24 hr tablet, Take 1/2 (one-half) tablet by mouth once daily, Disp: 45 tablet, Rfl: 0   metoprolol succinate (TOPROL-XL) 50 MG 24 hr tablet, TAKE 1 TABLET BY MOUTH ONCE DAILY TAKE  WITH  OR  IMMEDIATELY  FOLLOWING  A  MEAL, Disp: 30 tablet, Rfl: 7   senna (SENOKOT) 8.6 MG tablet, Take 1 tablet by mouth daily., Disp: , Rfl:    empagliflozin (JARDIANCE) 10 MG TABS tablet, Take 1 tablet (10 mg total) by mouth daily before breakfast., Disp: , Rfl:   Consent:   N/A  Disposition:   6 months follow-up sooner if needed. Patient may be asked to follow-up sooner based on the results of the above-mentioned testing.  Her questions  and concerns were addressed to her satisfaction. She voices understanding of the recommendations provided during this encounter.    Signed, Tessa Lerner, DO, [facility]  [facility]  [address] [place], [place] [address] 03/11/2023 9:03 AM

## 2023-03-11 NOTE — Telephone Encounter (Signed)
 Dr. Odis Hollingshead requesting remote transmission.  Will contact Pt and follow up.

## 2023-03-11 NOTE — Patient Instructions (Addendum)
 Medication Instructions:  Your physician recommends that you continue on your current medications as directed. Please refer to the Current Medication list given to you today.  Refill for Connie Swanson has been sent to your pharmacy.   *If you need a refill on your cardiac medications before your next appointment, please call your pharmacy*  Lab Work: Complete the previously ordered labs today: CMP, Pro-BNP, Magnesium, hemoglobin/hematocrit   If you have labs (blood work) drawn today and your tests are completely normal, you will receive your results only by: MyChart Message (if you have MyChart) OR A paper copy in the mail If you have any lab test that is abnormal or we need to change your treatment, we will call you to review the results.  Testing/Procedures: None ordered today.  Follow-Up: At [facility], you and your health needs are our priority.  As part of our continuing mission to provide you with exceptional heart care, we have created designated Provider Care Teams.  These Care Teams include your primary Cardiologist (physician) and Advanced Practice Providers (APPs -  Physician Assistants and Nurse Practitioners) who all work together to provide you with the care you need, when you need it.  Your next appointment:   6 month(s)  The format for your next appointment:   In Person  Provider:   Tessa Lerner, DO {

## 2023-03-12 LAB — COMPREHENSIVE METABOLIC PANEL
ALT: 10 IU/L (ref 0–32)
AST: 11 IU/L (ref 0–40)
Albumin: 3.4 g/dL — ABNORMAL LOW (ref 3.7–4.7)
Alkaline Phosphatase: 100 IU/L (ref 44–121)
BUN/Creatinine Ratio: 13 (ref 12–28)
BUN: 22 mg/dL (ref 8–27)
Bilirubin Total: 0.2 mg/dL (ref 0.0–1.2)
CO2: 23 mmol/L (ref 20–29)
Calcium: 9.3 mg/dL (ref 8.7–10.3)
Chloride: 107 mmol/L — ABNORMAL HIGH (ref 96–106)
Creatinine, Ser: 1.72 mg/dL — ABNORMAL HIGH (ref 0.57–1.00)
Globulin, Total: 3 g/dL (ref 1.5–4.5)
Glucose: 201 mg/dL — ABNORMAL HIGH (ref 70–99)
Potassium: 4.4 mmol/L (ref 3.5–5.2)
Sodium: 145 mmol/L — ABNORMAL HIGH (ref 134–144)
Total Protein: 6.4 g/dL (ref 6.0–8.5)
eGFR: 29 mL/min/{1.73_m2} — ABNORMAL LOW (ref >59)

## 2023-03-12 LAB — PRO B NATRIURETIC PEPTIDE: NT-Pro BNP: 3073 pg/mL — ABNORMAL HIGH (ref 0–738)

## 2023-03-12 LAB — HEMOGLOBIN AND HEMATOCRIT, BLOOD
Hematocrit: 37.9 % (ref 34.0–46.6)
Hemoglobin: 12.2 g/dL (ref 11.1–15.9)

## 2023-03-12 LAB — MAGNESIUM: Magnesium: 2 mg/dL (ref 1.6–2.3)

## 2023-03-14 ENCOUNTER — Other Ambulatory Visit: Payer: Self-pay | Admitting: Cardiology

## 2023-03-14 DIAGNOSIS — I5032 Chronic diastolic (congestive) heart failure: Secondary | ICD-10-CM

## 2023-03-14 DIAGNOSIS — I10 Essential (primary) hypertension: Secondary | ICD-10-CM

## 2023-03-15 ENCOUNTER — Telehealth: Payer: Self-pay | Admitting: Cardiology

## 2023-03-15 ENCOUNTER — Other Ambulatory Visit: Payer: Self-pay

## 2023-03-15 DIAGNOSIS — I10 Essential (primary) hypertension: Secondary | ICD-10-CM

## 2023-03-15 DIAGNOSIS — I5032 Chronic diastolic (congestive) heart failure: Secondary | ICD-10-CM

## 2023-03-15 MED ORDER — ISOSORBIDE MONONITRATE ER 30 MG PO TB24: 15.0000 mg | ORAL_TABLET | Freq: Every day | ORAL | 3 refills | Status: DC

## 2023-03-15 MED ORDER — BUMETANIDE 0.5 MG PO TABS: 0.5000 mg | ORAL_TABLET | Freq: Every day | ORAL | 3 refills | Status: DC

## 2023-03-15 NOTE — Telephone Encounter (Signed)
 Spoke with pt regarding her labs results. Pt made aware that Dr. Odis Hollingshead would like to increase her Bumex to 0.5 mg daily. A prescription was ordered and sent to the pharmacy of the pt's choice. Pt also made aware that Dr. Odis Hollingshead would like her to come in for labs in 1 week. A pro BNP and BMET were ordered and released. Pt plans to report to the [facility] on [address] in one week. Pt verbalized understanding. All questions, if any, were answered.

## 2023-03-15 NOTE — Telephone Encounter (Signed)
 Pt calling nurse back to get lab results

## 2023-03-22 DIAGNOSIS — I5032 Chronic diastolic (congestive) heart failure: Secondary | ICD-10-CM | POA: Diagnosis not present

## 2023-03-23 LAB — BASIC METABOLIC PANEL
BUN/Creatinine Ratio: 12 (ref 12–28)
BUN: 26 mg/dL (ref 8–27)
CO2: 21 mmol/L (ref 20–29)
Calcium: 9.3 mg/dL (ref 8.7–10.3)
Chloride: 105 mmol/L (ref 96–106)
Creatinine, Ser: 2.18 mg/dL — ABNORMAL HIGH (ref 0.57–1.00)
Glucose: 252 mg/dL — ABNORMAL HIGH (ref 70–99)
Potassium: 4.7 mmol/L (ref 3.5–5.2)
Sodium: 143 mmol/L (ref 134–144)
eGFR: 22 mL/min/{1.73_m2} — ABNORMAL LOW (ref >59)

## 2023-03-23 LAB — PRO B NATRIURETIC PEPTIDE: NT-Pro BNP: 2688 pg/mL — ABNORMAL HIGH (ref 0–738)

## 2023-03-26 ENCOUNTER — Telehealth: Payer: Self-pay | Admitting: Cardiology

## 2023-03-26 DIAGNOSIS — I5032 Chronic diastolic (congestive) heart failure: Secondary | ICD-10-CM

## 2023-03-26 MED ORDER — EMPAGLIFLOZIN 10 MG PO TABS: 10.0000 mg | ORAL_TABLET | Freq: Every day | ORAL | 3 refills | Status: DC

## 2023-03-26 NOTE — Telephone Encounter (Signed)
*  STAT* If patient is at the pharmacy, call can be transferred to refill team.   1. Which medications need to be refilled? (please list name of each medication and dose if known) empagliflozin (JARDIANCE) 10 MG TABS tablet    2. Would you like to learn more about the convenience, safety, & potential cost savings by using the [facility]?      3. Are you open to using the [facility] (Type [facility]. ).   4. Which pharmacy/location (including street and city if local pharmacy) is medication to be sent to? [facility] - [place], [place] - [address]    5. Do they need a 30 day or 90 day supply? 90

## 2023-03-26 NOTE — Telephone Encounter (Signed)
 Pt's medication was sent to pt's pharmacy as requested. Call the preferred pharmacy and they do not refill this medication, because it is not a specialty medication. Pharmacist suggest that I send requesting to [facility] mail order pharmacy and I resent medication to this pharmacy. I also called the pt to inform her as well. Pt verbalized understanding.Confirmation received.

## 2023-03-26 NOTE — Telephone Encounter (Signed)
*  STAT* If patient is at the pharmacy, call can be transferred to refill team.   1. Which medications need to be refilled? (please list name of each medication and dose if known)   empagliflozin (JARDIANCE) 10 MG TABS tablet   2. Would you like to learn more about the convenience, safety, & potential cost savings by using the [facility]?   3. Are you open to using the [facility] (Type [facility]. ).  4. Which pharmacy/location (including street and city if local pharmacy) is medication to be sent to?  [facility] [place] - [place], [place] - [address]   5. Do they need a 30 day or 90 day supply?  90 day   Patient stated she is following-up on her medication being sent to her new mail order pharmacy - [facility] [place] - [place], [facility] - [address], ph# [phone].  Patient noted she still has some medication and wants a call back to confirm.

## 2023-03-26 NOTE — Telephone Encounter (Signed)
 Pt's medication was sent to pt's pharmacy as requested. Confirmation received.

## 2023-03-29 ENCOUNTER — Other Ambulatory Visit: Payer: Self-pay

## 2023-03-29 DIAGNOSIS — I5032 Chronic diastolic (congestive) heart failure: Secondary | ICD-10-CM

## 2023-04-08 DIAGNOSIS — I130 Hypertensive heart and chronic kidney disease with heart failure and stage 1 through stage 4 chronic kidney disease, or unspecified chronic kidney disease: Secondary | ICD-10-CM | POA: Diagnosis not present

## 2023-04-08 DIAGNOSIS — E1129 Type 2 diabetes mellitus with other diabetic kidney complication: Secondary | ICD-10-CM | POA: Diagnosis not present

## 2023-04-10 ENCOUNTER — Encounter: Payer: Self-pay | Admitting: Podiatry

## 2023-04-10 ENCOUNTER — Ambulatory Visit: Payer: Medicare Other | Admitting: Podiatry

## 2023-04-10 DIAGNOSIS — M79675 Pain in left toe(s): Secondary | ICD-10-CM

## 2023-04-10 DIAGNOSIS — E119 Type 2 diabetes mellitus without complications: Secondary | ICD-10-CM

## 2023-04-10 DIAGNOSIS — M79674 Pain in right toe(s): Secondary | ICD-10-CM

## 2023-04-10 DIAGNOSIS — B351 Tinea unguium: Secondary | ICD-10-CM

## 2023-04-10 NOTE — Progress Notes (Signed)
This patient returns to my office for at risk foot care.  This patient requires this care by a professional since this patient will be at risk due to having diabetes and chronic kidney disease   This patient is unable to cut nails herself since the patient cannot reach her nails.These nails are painful walking and wearing shoes.  This patient presents for at risk foot care today.  General Appearance  Alert, conversant and in no acute stress.  Vascular  Dorsalis pedis and posterior tibial  pulses are palpable  bilaterally.  Capillary return is within normal limits  bilaterally. Temperature is within normal limits  bilaterally.  Neurologic  Senn-Weinstein monofilament wire test within normal limits  bilaterally. Muscle power within normal limits bilaterally.  Nails Thick disfigured discolored nails with subungual debris  Hallux nails  bilaterally. No evidence of bacterial infection or drainage bilaterally.  Orthopedic  No limitations of motion  feet .  No crepitus or effusions noted.  No bony pathology or digital deformities noted.Mild  HAV  B/L.  Skin  normotropic skin with no porokeratosis noted bilaterally.  No signs of infections or ulcers noted.     Onychomycosis  Pain in right toes  Pain in left toes  Consent was obtained for treatment procedures.   Mechanical debridement of nails 1-5  bilaterally performed with a nail nipper.  Filed with dremel without incident.    Return office visit  10 weeks                   Told patient to return for periodic foot care and evaluation due to potential at risk complications.   Gardiner Barefoot DPM

## 2023-04-18 DIAGNOSIS — I5032 Chronic diastolic (congestive) heart failure: Secondary | ICD-10-CM | POA: Diagnosis not present

## 2023-04-19 LAB — BASIC METABOLIC PANEL WITH GFR
BUN/Creatinine Ratio: 13 (ref 12–28)
BUN: 22 mg/dL (ref 8–27)
CO2: 22 mmol/L (ref 20–29)
Calcium: 10.1 mg/dL (ref 8.7–10.3)
Chloride: 104 mmol/L (ref 96–106)
Creatinine, Ser: 1.67 mg/dL — ABNORMAL HIGH (ref 0.57–1.00)
Glucose: 114 mg/dL — ABNORMAL HIGH (ref 70–99)
Potassium: 4 mmol/L (ref 3.5–5.2)
Sodium: 141 mmol/L (ref 134–144)
eGFR: 30 mL/min/{1.73_m2} — ABNORMAL LOW (ref >59)

## 2023-04-19 LAB — PRO B NATRIURETIC PEPTIDE: NT-Pro BNP: 1527 pg/mL — ABNORMAL HIGH (ref 0–738)

## 2023-04-24 ENCOUNTER — Ambulatory Visit (INDEPENDENT_AMBULATORY_CARE_PROVIDER_SITE_OTHER)

## 2023-04-24 DIAGNOSIS — I442 Atrioventricular block, complete: Secondary | ICD-10-CM | POA: Diagnosis not present

## 2023-04-24 LAB — CUP PACEART REMOTE DEVICE CHECK
Battery Remaining Longevity: 88 mo
Battery Remaining Percentage: 86 %
Battery Voltage: 2.99 V
Brady Statistic AP VP Percent: 6.9 %
Brady Statistic AP VS Percent: 1 %
Brady Statistic AS VP Percent: 78 %
Brady Statistic AS VS Percent: 14 %
Brady Statistic RA Percent Paced: 7.6 %
Brady Statistic RV Percent Paced: 85 %
Date Time Interrogation Session: 20250416040014
Implantable Lead Connection Status: 753985
Implantable Lead Connection Status: 753985
Implantable Lead Implant Date: 20231017
Implantable Lead Implant Date: 20231017
Implantable Lead Location: 753859
Implantable Lead Location: 753860
Implantable Pulse Generator Implant Date: 20231017
Lead Channel Impedance Value: 400 Ohm
Lead Channel Impedance Value: 490 Ohm
Lead Channel Pacing Threshold Amplitude: 1 V
Lead Channel Pacing Threshold Amplitude: 1 V
Lead Channel Pacing Threshold Pulse Width: 0.5 ms
Lead Channel Pacing Threshold Pulse Width: 0.5 ms
Lead Channel Sensing Intrinsic Amplitude: 4.2 mV
Lead Channel Sensing Intrinsic Amplitude: 8.7 mV
Lead Channel Setting Pacing Amplitude: 2.5 V
Lead Channel Setting Pacing Amplitude: 2.5 V
Lead Channel Setting Pacing Pulse Width: 0.5 ms
Lead Channel Setting Sensing Sensitivity: 2 mV
Pulse Gen Model: 2272
Pulse Gen Serial Number: 8111834

## 2023-05-05 ENCOUNTER — Encounter: Payer: Self-pay | Admitting: Cardiology

## 2023-05-06 DIAGNOSIS — K080 Exfoliation of teeth due to systemic causes: Secondary | ICD-10-CM | POA: Diagnosis not present

## 2023-05-16 DIAGNOSIS — N1832 Chronic kidney disease, stage 3b: Secondary | ICD-10-CM | POA: Diagnosis not present

## 2023-05-22 DIAGNOSIS — I129 Hypertensive chronic kidney disease with stage 1 through stage 4 chronic kidney disease, or unspecified chronic kidney disease: Secondary | ICD-10-CM | POA: Diagnosis not present

## 2023-05-22 DIAGNOSIS — N1832 Chronic kidney disease, stage 3b: Secondary | ICD-10-CM | POA: Diagnosis not present

## 2023-05-22 DIAGNOSIS — D631 Anemia in chronic kidney disease: Secondary | ICD-10-CM | POA: Diagnosis not present

## 2023-05-22 DIAGNOSIS — N2581 Secondary hyperparathyroidism of renal origin: Secondary | ICD-10-CM | POA: Diagnosis not present

## 2023-06-05 NOTE — Progress Notes (Signed)
 Remote pacemaker transmission.

## 2023-06-12 ENCOUNTER — Other Ambulatory Visit (HOSPITAL_COMMUNITY): Payer: Self-pay | Admitting: *Deleted

## 2023-06-12 ENCOUNTER — Telehealth: Payer: Self-pay

## 2023-06-12 NOTE — Telephone Encounter (Signed)
 Auth Submission: APPROVED Site of care: Site of care: [facility] Payer: [employer] medicare Medication & CPT/J Code(s) submitted: Prolia  (Denosumab ) [id number] Route of submission (phone, fax, portal): fax Phone # Fax # Auth type: Buy/Bill PB Units/visits requested: 60mg  x 2 doses Reference number: [id number] Approval from: 06/12/23 to 06/10/24

## 2023-06-13 ENCOUNTER — Ambulatory Visit (HOSPITAL_COMMUNITY)
Admission: RE | Admit: 2023-06-13 | Discharge: 2023-06-13 | Disposition: A | Discharge status: Home / Self Care | Source: Ambulatory Visit | Attending: Internal Medicine | Admitting: Internal Medicine

## 2023-06-13 DIAGNOSIS — M810 Age-related osteoporosis without current pathological fracture: Secondary | ICD-10-CM | POA: Diagnosis not present

## 2023-06-13 MED ORDER — DENOSUMAB 60 MG/ML ~~LOC~~ SOSY
PREFILLED_SYRINGE | SUBCUTANEOUS | Status: AC
  Filled 2023-06-13: qty 1

## 2023-06-13 MED ORDER — DENOSUMAB 60 MG/ML ~~LOC~~ SOSY
60.0000 mg | PREFILLED_SYRINGE | Freq: Once | SUBCUTANEOUS | Status: AC
  Administered 2023-06-13: 60 mg via SUBCUTANEOUS

## 2023-06-21 ENCOUNTER — Encounter: Payer: Self-pay | Admitting: Podiatry

## 2023-06-21 ENCOUNTER — Ambulatory Visit (INDEPENDENT_AMBULATORY_CARE_PROVIDER_SITE_OTHER): Admitting: Podiatry

## 2023-06-21 DIAGNOSIS — B351 Tinea unguium: Secondary | ICD-10-CM

## 2023-06-21 DIAGNOSIS — M79675 Pain in left toe(s): Secondary | ICD-10-CM | POA: Diagnosis not present

## 2023-06-21 DIAGNOSIS — M79674 Pain in right toe(s): Secondary | ICD-10-CM | POA: Diagnosis not present

## 2023-06-21 DIAGNOSIS — E119 Type 2 diabetes mellitus without complications: Secondary | ICD-10-CM

## 2023-06-21 NOTE — Progress Notes (Signed)
This patient returns to my office for at risk foot care.  This patient requires this care by a professional since this patient will be at risk due to having diabetes and chronic kidney disease   This patient is unable to cut nails herself since the patient cannot reach her nails.These nails are painful walking and wearing shoes.  This patient presents for at risk foot care today.  General Appearance  Alert, conversant and in no acute stress.  Vascular  Dorsalis pedis and posterior tibial  pulses are palpable  bilaterally.  Capillary return is within normal limits  bilaterally. Temperature is within normal limits  bilaterally.  Neurologic  Senn-Weinstein monofilament wire test within normal limits  bilaterally. Muscle power within normal limits bilaterally.  Nails Thick disfigured discolored nails with subungual debris  Hallux nails  bilaterally. No evidence of bacterial infection or drainage bilaterally.  Orthopedic  No limitations of motion  feet .  No crepitus or effusions noted.  No bony pathology or digital deformities noted.Mild  HAV  B/L.  Skin  normotropic skin with no porokeratosis noted bilaterally.  No signs of infections or ulcers noted.     Onychomycosis  Pain in right toes  Pain in left toes  Consent was obtained for treatment procedures.   Mechanical debridement of nails 1-5  bilaterally performed with a nail nipper.  Filed with dremel without incident.    Return office visit  10 weeks                   Told patient to return for periodic foot care and evaluation due to potential at risk complications.   Gardiner Barefoot DPM

## 2023-07-10 DIAGNOSIS — I130 Hypertensive heart and chronic kidney disease with heart failure and stage 1 through stage 4 chronic kidney disease, or unspecified chronic kidney disease: Secondary | ICD-10-CM | POA: Diagnosis not present

## 2023-07-10 DIAGNOSIS — E1122 Type 2 diabetes mellitus with diabetic chronic kidney disease: Secondary | ICD-10-CM | POA: Diagnosis not present

## 2023-07-24 ENCOUNTER — Ambulatory Visit

## 2023-07-24 DIAGNOSIS — I442 Atrioventricular block, complete: Secondary | ICD-10-CM

## 2023-07-24 LAB — CUP PACEART REMOTE DEVICE CHECK
Battery Remaining Longevity: 85 mo
Battery Remaining Percentage: 83 %
Battery Voltage: 2.99 V
Brady Statistic AP VP Percent: 6.5 %
Brady Statistic AP VS Percent: 1 %
Brady Statistic AS VP Percent: 82 %
Brady Statistic AS VS Percent: 11 %
Brady Statistic RA Percent Paced: 7 %
Brady Statistic RV Percent Paced: 89 %
Date Time Interrogation Session: 20250716040014
Implantable Lead Connection Status: 753985
Implantable Lead Connection Status: 753985
Implantable Lead Implant Date: 20231017
Implantable Lead Implant Date: 20231017
Implantable Lead Location: 753859
Implantable Lead Location: 753860
Implantable Pulse Generator Implant Date: 20231017
Lead Channel Impedance Value: 400 Ohm
Lead Channel Impedance Value: 480 Ohm
Lead Channel Pacing Threshold Amplitude: 1 V
Lead Channel Pacing Threshold Amplitude: 1 V
Lead Channel Pacing Threshold Pulse Width: 0.5 ms
Lead Channel Pacing Threshold Pulse Width: 0.5 ms
Lead Channel Sensing Intrinsic Amplitude: 12 mV
Lead Channel Sensing Intrinsic Amplitude: 3.1 mV
Lead Channel Setting Pacing Amplitude: 2.5 V
Lead Channel Setting Pacing Amplitude: 2.5 V
Lead Channel Setting Pacing Pulse Width: 0.5 ms
Lead Channel Setting Sensing Sensitivity: 2 mV
Pulse Gen Model: 2272
Pulse Gen Serial Number: 8111834

## 2023-07-26 ENCOUNTER — Ambulatory Visit: Payer: Self-pay | Admitting: Cardiology

## 2023-08-07 DIAGNOSIS — K080 Exfoliation of teeth due to systemic causes: Secondary | ICD-10-CM | POA: Diagnosis not present

## 2023-08-20 DIAGNOSIS — E1129 Type 2 diabetes mellitus with other diabetic kidney complication: Secondary | ICD-10-CM | POA: Diagnosis not present

## 2023-09-02 DIAGNOSIS — K080 Exfoliation of teeth due to systemic causes: Secondary | ICD-10-CM | POA: Diagnosis not present

## 2023-09-04 ENCOUNTER — Telehealth: Payer: Self-pay | Admitting: Cardiology

## 2023-09-04 DIAGNOSIS — I130 Hypertensive heart and chronic kidney disease with heart failure and stage 1 through stage 4 chronic kidney disease, or unspecified chronic kidney disease: Secondary | ICD-10-CM

## 2023-09-04 DIAGNOSIS — I5033 Acute on chronic diastolic (congestive) heart failure: Secondary | ICD-10-CM

## 2023-09-04 DIAGNOSIS — E782 Mixed hyperlipidemia: Secondary | ICD-10-CM

## 2023-09-04 DIAGNOSIS — Z79899 Other long term (current) drug therapy: Secondary | ICD-10-CM

## 2023-09-04 DIAGNOSIS — I2510 Atherosclerotic heart disease of native coronary artery without angina pectoris: Secondary | ICD-10-CM

## 2023-09-04 NOTE — Telephone Encounter (Signed)
  Patient is asking if she should have any labs done before her appointment with Tolia on 09/11/23

## 2023-09-04 NOTE — Telephone Encounter (Signed)
 Per Dr. Michele, ordered and released BMP, NT-proBNP, and Magnesium  level to be done 48hr before the office visit. [facility] is closed on 09/09/23, so put orders in for 09/06/23. Patient notified via [facility].

## 2023-09-04 NOTE — Telephone Encounter (Signed)
Please order and release BMP, NT-proBNP, and Magnesium level to be done 48hr before the office visit.  Kit Brubacher, DO, FACC

## 2023-09-06 ENCOUNTER — Ambulatory Visit: Admitting: Podiatry

## 2023-09-06 ENCOUNTER — Encounter: Payer: Self-pay | Admitting: Podiatry

## 2023-09-06 DIAGNOSIS — B351 Tinea unguium: Secondary | ICD-10-CM

## 2023-09-06 DIAGNOSIS — E119 Type 2 diabetes mellitus without complications: Secondary | ICD-10-CM

## 2023-09-06 DIAGNOSIS — M79675 Pain in left toe(s): Secondary | ICD-10-CM | POA: Diagnosis not present

## 2023-09-06 DIAGNOSIS — M79674 Pain in right toe(s): Secondary | ICD-10-CM | POA: Diagnosis not present

## 2023-09-06 NOTE — Progress Notes (Signed)
This patient returns to my office for at risk foot care.  This patient requires this care by a professional since this patient will be at risk due to having diabetes and chronic kidney disease   This patient is unable to cut nails herself since the patient cannot reach her nails.These nails are painful walking and wearing shoes.  This patient presents for at risk foot care today.  General Appearance  Alert, conversant and in no acute stress.  Vascular  Dorsalis pedis and posterior tibial  pulses are palpable  bilaterally.  Capillary return is within normal limits  bilaterally. Temperature is within normal limits  bilaterally.  Neurologic  Senn-Weinstein monofilament wire test within normal limits  bilaterally. Muscle power within normal limits bilaterally.  Nails Thick disfigured discolored nails with subungual debris  Hallux nails  bilaterally. No evidence of bacterial infection or drainage bilaterally.  Orthopedic  No limitations of motion  feet .  No crepitus or effusions noted.  No bony pathology or digital deformities noted.Mild  HAV  B/L.  Skin  normotropic skin with no porokeratosis noted bilaterally.  No signs of infections or ulcers noted.     Onychomycosis  Pain in right toes  Pain in left toes  Consent was obtained for treatment procedures.   Mechanical debridement of nails 1-5  bilaterally performed with a nail nipper.  Filed with dremel without incident.    Return office visit  10 weeks                   Told patient to return for periodic foot care and evaluation due to potential at risk complications.   Gardiner Barefoot DPM

## 2023-09-10 ENCOUNTER — Encounter (HOSPITAL_BASED_OUTPATIENT_CLINIC_OR_DEPARTMENT_OTHER): Payer: Self-pay

## 2023-09-11 ENCOUNTER — Encounter: Payer: Self-pay | Admitting: Cardiology

## 2023-09-11 ENCOUNTER — Ambulatory Visit: Attending: Cardiology | Admitting: Cardiology

## 2023-09-11 VITALS — BP 138/80 | HR 66 | Resp 16 | Ht 61.0 in | Wt 202.6 lb

## 2023-09-11 DIAGNOSIS — N1832 Chronic kidney disease, stage 3b: Secondary | ICD-10-CM | POA: Diagnosis not present

## 2023-09-11 DIAGNOSIS — R001 Bradycardia, unspecified: Secondary | ICD-10-CM

## 2023-09-11 DIAGNOSIS — I2510 Atherosclerotic heart disease of native coronary artery without angina pectoris: Secondary | ICD-10-CM | POA: Diagnosis not present

## 2023-09-11 DIAGNOSIS — I5032 Chronic diastolic (congestive) heart failure: Secondary | ICD-10-CM

## 2023-09-11 DIAGNOSIS — N184 Chronic kidney disease, stage 4 (severe): Secondary | ICD-10-CM | POA: Diagnosis not present

## 2023-09-11 DIAGNOSIS — I442 Atrioventricular block, complete: Secondary | ICD-10-CM | POA: Diagnosis not present

## 2023-09-11 DIAGNOSIS — Z79899 Other long term (current) drug therapy: Secondary | ICD-10-CM | POA: Diagnosis not present

## 2023-09-11 DIAGNOSIS — I130 Hypertensive heart and chronic kidney disease with heart failure and stage 1 through stage 4 chronic kidney disease, or unspecified chronic kidney disease: Secondary | ICD-10-CM | POA: Diagnosis not present

## 2023-09-11 DIAGNOSIS — E782 Mixed hyperlipidemia: Secondary | ICD-10-CM

## 2023-09-11 DIAGNOSIS — I5033 Acute on chronic diastolic (congestive) heart failure: Secondary | ICD-10-CM | POA: Diagnosis not present

## 2023-09-11 DIAGNOSIS — Z950 Presence of cardiac pacemaker: Secondary | ICD-10-CM

## 2023-09-11 DIAGNOSIS — Z794 Long term (current) use of insulin: Secondary | ICD-10-CM

## 2023-09-11 DIAGNOSIS — E1165 Type 2 diabetes mellitus with hyperglycemia: Secondary | ICD-10-CM

## 2023-09-11 NOTE — Patient Instructions (Signed)
   Testing/Procedures:  Your physician has requested that you have an echocardiogram. Echocardiography is a painless test that uses sound waves to create images of your heart. It provides your doctor with information about the size and shape of your heart and how well your heart's chambers and valves are working. This procedure takes approximately one hour. There are no restrictions for this procedure. Please do NOT wear cologne, perfume, aftershave, or lotions (deodorant is allowed). Please arrive 15 minutes prior to your appointment time.  Please note: We ask at that you not bring children with you during ultrasound (echo/ vascular) testing. Due to room size and safety concerns, children are not allowed in the ultrasound rooms during exams. Our front office staff cannot provide observation of children in our lobby area while testing is being conducted. An adult accompanying a patient to their appointment will only be allowed in the ultrasound room at the discretion of the ultrasound technician under special circumstances. We apologize for any inconvenience. [address] AUGUST 2026  Follow-Up: At [facility], you and your health needs are our priority.  As part of our continuing mission to provide you with exceptional heart care, our providers are all part of one team.  This team includes your primary Cardiologist (physician) and Advanced Practice Providers or APPs (Physician Assistants and Nurse Practitioners) who all work together to provide you with the care you need, when you need it.  Your next appointment:   12 month(s)  Provider:   Madonna Large, DO

## 2023-09-11 NOTE — Progress Notes (Signed)
 Cardiology Office Note:  .   Date:  09/11/2023  ID:  Connie Swanson, DOB 1940/07/15, MRN [id number] PCP:  Vernadine Charlie ORN, MD  Nephrologist: Dr. Tobie [facility] Providers: Dr. Gordy Bergamo  [facility] Providers Cardiologist:  Madonna Large, DO, [facility] (established care 04/27/2020 )  Electrophysiologist:  OLE ONEIDA HOLTS, MD  Electrophysiologist:  OLE ONEIDA HOLTS, MD  Click to update primary MD,subspecialty MD or APP then REFRESH:1}    Chief Complaint  Patient presents with   Chronic heart failure with preserved ejection fraction   Follow-up    History of Present Illness: .   Connie Swanson is a 83 y.o. female whose past medical history and cardiovascular risk factors includes: Intermittent complete heart block status post pacemaker implantation (10/2021), HFpEF, single-vessel CAD (nonobstructive), insulin -dependent diabetes mellitus type 2, chronic kidney disease stage IIIb/IV, hypertension, history of ischemic colitis, hypothyroidism, obesity due to excess calories, vertigo, history of COVID-19 infection (January 2022), postmenopausal female, advanced age.   Patient being followed by the practice given her history of bradycardia/intermittent complete heart block status post pacemaker implant and HFpEF.  She presents today for 4-month follow-up visit.  Denies anginal chest pain or heart failure symptoms. Functional capacity remains stable. Continues to have shortness of breath over exertional activities. Weight has slightly gone up likely due to dietary indiscretion over the last 6 months. Not on nasal cannula oxygen. Now on insulin  for diabetes management.  Review of Systems: .   Review of Systems  Cardiovascular:  Positive for dyspnea on exertion (chronic). Negative for chest pain, claudication, irregular heartbeat, leg swelling, near-syncope, orthopnea, palpitations, paroxysmal nocturnal dyspnea and syncope.  Respiratory:  Negative for shortness of breath.    Hematologic/Lymphatic: Negative for bleeding problem.    Studies Reviewed:   Pacemaker:  10/24/2021:  Dual chamber permanent pacemaker with left bundle area lead. [employer] Assurity MRI Implant 10/24/2021, by Dr. HOLTS.    Remote dual-chamber pacemaker transmission 07/24/2023: Remote pacemaker interrogation. Presenting Rhythm:A-sensed V-paced. Battery and lead parameters stable with stable capture and sensing. Device programming is appropriate. Continue remote monitoring.   Echocardiogram: 05/04/2020:LVEF 50-55%  10/22/2022  1. Left ventricular ejection fraction, by estimation, is 70 to 75%. The  left ventricle has hyperdynamic function. The left ventricle has no  regional wall motion abnormalities. There is moderate concentric left  ventricular hypertrophy. Left ventricular  diastolic parameters are indeterminate.   2. Right ventricular systolic function is normal. The right ventricular  size is mildly enlarged. There is moderately elevated pulmonary artery  systolic pressure. The estimated right ventricular systolic pressure is  54.0 mmHg.   3. Left atrial size was mild to moderately dilated.   4. Right atrial size was mildly dilated.   5. The mitral valve is degenerative. No evidence of mitral valve  regurgitation. No evidence of mitral stenosis. Moderate mitral annular  calcification.   6. The aortic valve is tricuspid. There is mild calcification of the  aortic valve. Aortic valve regurgitation is not visualized. Aortic valve  sclerosis/calcification is present, without any evidence of aortic  stenosis. Aortic valve area, by VTI measures   2.80 cm. Aortic valve mean gradient measures 8.0 mmHg. Aortic valve Vmax  measures 2.11 m/s.   7. The inferior vena cava is normal in size with greater than 50%  respiratory variability, suggesting right atrial pressure of 3 mmHg.    Stress Testing: Lexiscan  Tetrofosmin stress test 05/11/2020: Lexiscan  nuclear stress test performed  using 1-day protocol. SPECT images show uniform breast  tissue attenuation in both rest and stress images. In addition, there is medium sized, medium intensity, fixed perfusion defect in basal inferior/inferoseptal myocardium. All segments of left ventricle demonstrated septal dyskinesis, global hypokinesis. Stress LVEF 34%. High risk study due to low stress LVEF.   Heart Catheterization: 10/10/2010: HEMODYNAMIC DATA:  Right heart catheterization:  RA pressure 7/7, mean 6 mmHg.  RA saturation 76%.   RV pressure 24/1, end-diastolic pressure 4 mmHg.   PA pressure 27/40 with a mean of 21 mmHg.  PA saturation was 75%.   Pulmonary capillary wedge 6/5 with a mean of 3 mmHg.  Aortic saturation was 100%.   Cardiac output was 6.4 with a cardiac index of 3.34 by Fick.   Right coronary artery:  Right coronary artery is a large caliber and superdominant vessel.  Gives origin to large PL branch and 2 PDA large branches.  Smooth and normal.   Left main coronary artery:  Left main coronary artery is a large-caliber vessel, which is smooth and normal.   Circumflex coronary artery:  Circumflex coronary artery is a very large- caliber vessel.  Gives origin to a moderate-sized obtuse marginal 1 and continues distally as a large obtuse marginal 2.  Smooth and normal.   LAD:  LAD is a large-caliber vessel in the proximal segment.  Gives origin to a moderate-sized diagonal 1 and then gives origin to a very large diagonal 2, which is bigger than the LAD itself.  Between diagonal 1 and diagonal 2, there is a eccentric 50% stenosis noted in the LAD. The diagonal 2, which is very large has a ostial 30% stenosis. Otherwise, except for this focal stenosis of 50%, there is no other lesion noted in the LAD or the large diagonal 2.  RADIOLOGY: NA  Risk Assessment/Calculations:   N/A   Labs:       Latest Ref Rng & Units 03/11/2023   10:13 AM 10/25/2022    7:02 AM 10/24/2022    6:59 AM  CBC  WBC 4.0  - 10.5 K/uL  8.9  9.7   Hemoglobin 11.1 - 15.9 g/dL 87.7  89.0  89.4   Hematocrit 34.0 - 46.6 % 37.9  33.2  32.8   Platelets 150 - 400 K/uL  233  228        Latest Ref Rng & Units 04/18/2023   10:50 AM 03/22/2023    3:21 PM 03/11/2023   10:13 AM  BMP  Glucose 70 - 99 mg/dL 885  747  798   BUN 8 - 27 mg/dL 22  26  22    Creatinine 0.57 - 1.00 mg/dL 8.32  7.81  8.27   BUN/Creat Ratio 12 - 28 13  12  13    Sodium 134 - 144 mmol/L 141  143  145   Potassium 3.5 - 5.2 mmol/L 4.0  4.7  4.4   Chloride 96 - 106 mmol/L 104  105  107   CO2 20 - 29 mmol/L 22  21  23    Calcium  8.7 - 10.3 mg/dL 89.8  9.3  9.3       Latest Ref Rng & Units 04/18/2023   10:50 AM 03/22/2023    3:21 PM 03/11/2023   10:13 AM  CMP  Glucose 70 - 99 mg/dL 885  747  798   BUN 8 - 27 mg/dL 22  26  22    Creatinine 0.57 - 1.00 mg/dL 8.32  7.81  8.27   Sodium 134 - 144 mmol/L 141  143  145   Potassium 3.5 - 5.2 mmol/L 4.0  4.7  4.4   Chloride 96 - 106 mmol/L 104  105  107   CO2 20 - 29 mmol/L 22  21  23    Calcium  8.7 - 10.3 mg/dL 89.8  9.3  9.3   Total Protein 6.0 - 8.5 g/dL   6.4   Total Bilirubin 0.0 - 1.2 mg/dL   0.2   Alkaline Phos 44 - 121 IU/L   100   AST 0 - 40 IU/L   11   ALT 0 - 32 IU/L   10     Lab Results  Component Value Date   CHOL 129 10/24/2021   HDL 25 (L) 10/24/2021   LDLCALC 73 10/24/2021   TRIG 155 (H) 10/24/2021   CHOLHDL 5.2 10/24/2021   No results for input(s): LIPOA in the last 8760 hours. No components found for: NTPROBNP Recent Labs    03/11/23 1013 03/22/23 1521 04/18/23 1050  PROBNP 3,073* 2,688* 1,527*   No results for input(s): TSH in the last 8760 hours.   Physical Exam:    Today's Vitals   09/11/23 1119  BP: 138/80  Pulse: 66  Resp: 16  SpO2: 94%  Weight: 202 lb 9.6 oz (91.9 kg)  Height: 5' 1 (1.549 m)   Body mass index is 38.28 kg/m. Wt Readings from Last 3 Encounters:  09/11/23 202 lb 9.6 oz (91.9 kg)  03/11/23 198 lb 12.8 oz (90.2 kg)  10/26/22 (!) 346  lb 9 oz (157.2 kg)    Physical Exam  Constitutional: No distress.  Age appropriate, hemodynamically stable.   Neck: No JVD present.  Cardiovascular: Normal rate, regular rhythm, S1 normal, S2 normal, intact distal pulses and normal pulses. Exam reveals no gallop, no S3 and no S4.  No murmur heard. Pulmonary/Chest: Effort normal and breath sounds normal. No stridor. She has no wheezes. She has no rales.  Pacemaker in situ  Abdominal: Soft. Bowel sounds are normal. She exhibits no distension. There is no abdominal tenderness.  Abdominal obesity  Musculoskeletal:        General: No edema.     Cervical back: Neck supple.  Neurological: She is alert and oriented to person, place, and time. She has intact cranial nerves (2-12).  Skin: Skin is warm and moist.     Impression & Recommendation(s):  Impression:   ICD-10-CM   1. Chronic heart failure with preserved ejection fraction (HCC)  I50.32 ECHOCARDIOGRAM COMPLETE    2. Nonobstructive atherosclerosis of coronary artery  I25.10     3. Symptomatic bradycardia  R00.1     4. Complete heart block, transient (HCC)  I44.2     5. Cardiac pacemaker in situ  Z95.0     6. Type 2 diabetes mellitus with hyperglycemia, with long-term current use of insulin  (HCC)  E11.65    Z79.4     7. Mixed hyperlipidemia  E78.2        Recommendation(s):  Chronic heart failure with preserved ejection fraction (HCC) Stage B, NYHA class II. Overall euvolemic on physical examination, slight weight gain since last office visit 6 months ago likely secondary to dietary indiscretion. Was supposed to have labs prior to today's office visit but are pending.  She will have them done later today Continue Bumex  0.5 mg p.o. daily Continue Jardiance  10 mg p.o. daily Continue hydralazine  25 mg p.o. 3 times daily. Continue Imdur  30 mg half a tablet p.o. daily Continue Toprol -XL 50 mg p.o. daily ARNI has  been discontinued in the past due to hyperkalemia and renal  function. I have asked her to follow-up with her nephrologist to consider initiation of ARNI or ARB depending on renal function given her HFpEF and diabetes.  Patient states that she has an appointment with them on 09/18/2023.Will likely coordinate care based on her follow-up labs from today. Echocardiogram in August 2026 to reevaluate LVEF and valvular heart disease  Nonobstructive atherosclerosis of coronary artery Denies anginal chest pain. Reemphasized the importance of secondary prevention with focus on improving her modifiable cardiovascular risk factors such as glycemic control, lipid management, blood pressure control, weight loss.  Symptomatic bradycardia Complete heart block, transient (HCC) Cardiac pacemaker in situ Last pacemaker report reviewed.   Being followed in the device clinic.  Type 2 diabetes mellitus with hyperglycemia, with long-term current use of insulin  (HCC) Reemphasized importance of glycemic control. Started on insulin  since last office visit. Last hemoglobin A1c 9.1% as of July 2025 Currently on Jardiance  and statin therapy  Mixed hyperlipidemia Currently on Lipitor 40 mg p.o. daily.   She denies myalgia or other side effects. Most recent lipids dated 10/10/18 24K PN database, independently reviewed 69 mg.  LDL 69 mg/dL.  Orders Placed:  Orders Placed This Encounter  Procedures   ECHOCARDIOGRAM COMPLETE    Standing Status:   Future    Expected Date:   08/08/2024    Expiration Date:   09/10/2024    Where should this test be performed:   [facility] [place]    Perflutren  DEFINITY  (image enhancing agent) should be administered unless hypersensitivity or allergy exist:   Administer Perflutren     Reason for exam-Echo:   Congestive Heart Failure  I50.9    Final Medication List:    No orders of the defined types were placed in this encounter.   There are no discontinued medications.    Current Outpatient Medications:    acetaminophen  (TYLENOL ) 325 MG  tablet, Take 2 tablets (650 mg total) by mouth every 4 (four) hours as needed for headache or mild pain., Disp: , Rfl:    allopurinol  (ZYLOPRIM ) 300 MG tablet, Take 300 mg by mouth daily., Disp: , Rfl:    atorvastatin  (LIPITOR) 40 MG tablet, Take 40 mg by mouth daily., Disp: , Rfl:    B-D INS SYR ULTRAFINE 1CC/30G 30G X 1/2 1 ML MISC, USE SYRINGE AS DIRECTED TWICE DAILY, Disp: , Rfl:    bumetanide  (BUMEX ) 0.5 MG tablet, Take 1 tablet (0.5 mg total) by mouth daily., Disp: 90 tablet, Rfl: 3   Cholecalciferol  (VITAMIN D3) 125 MCG (5000 UT) TABS, Take 5,000 Units by mouth daily at 12 noon., Disp: , Rfl:    denosumab  (PROLIA ) 60 MG/ML SOSY injection, Inject 60 mg into the skin every 6 (six) months., Disp: , Rfl:    empagliflozin  (JARDIANCE ) 10 MG TABS tablet, Take 1 tablet (10 mg total) by mouth daily before breakfast., Disp: 90 tablet, Rfl: 3   hydrALAZINE  (APRESOLINE ) 25 MG tablet, TAKE 1 TABLET BY MOUTH THREE TIMES DAILY, Disp: 270 tablet, Rfl: 2   insulin  NPH-regular Human (NOVOLIN 70/30) (70-30) 100 UNIT/ML injection, Inject 50 Units into the skin 2 (two) times daily with a meal. 40 units in the AM and 10 units at PM, Disp: , Rfl:    isosorbide  mononitrate (IMDUR ) 30 MG 24 hr tablet, Take 0.5 tablets (15 mg total) by mouth daily., Disp: 45 tablet, Rfl: 3   metoprolol  succinate (TOPROL -XL) 50 MG 24 hr tablet, TAKE 1 TABLET BY MOUTH ONCE  DAILY TAKE  WITH  OR  IMMEDIATELY  FOLLOWING  A  MEAL, Disp: 30 tablet, Rfl: 7   senna (SENOKOT) 8.6 MG tablet, Take 1 tablet by mouth daily., Disp: , Rfl:   Consent:   N/A  Disposition:   12 months follow-up, September 2026 Patient may be asked to follow-up sooner based on the results of the above-mentioned testing.  Her questions and concerns were addressed to her satisfaction. She voices understanding of the recommendations provided during this encounter.    Signed, Madonna Michele HAS, [facility]  A Division of [facility] [address], [place], [place] [address]   09/11/2023 7:14 PM

## 2023-09-12 LAB — BASIC METABOLIC PANEL WITH GFR
BUN/Creatinine Ratio: 17 (ref 12–28)
BUN: 31 mg/dL — ABNORMAL HIGH (ref 8–27)
CO2: 23 mmol/L (ref 20–29)
Calcium: 9.3 mg/dL (ref 8.7–10.3)
Chloride: 105 mmol/L (ref 96–106)
Creatinine, Ser: 1.87 mg/dL — ABNORMAL HIGH (ref 0.57–1.00)
Glucose: 87 mg/dL (ref 70–99)
Potassium: 4.5 mmol/L (ref 3.5–5.2)
Sodium: 141 mmol/L (ref 134–144)
eGFR: 26 mL/min/1.73 — ABNORMAL LOW (ref >59)

## 2023-09-12 LAB — MAGNESIUM: Magnesium: 1.9 mg/dL (ref 1.6–2.3)

## 2023-09-12 LAB — PRO B NATRIURETIC PEPTIDE: NT-Pro BNP: 1449 pg/mL — ABNORMAL HIGH (ref 0–738)

## 2023-09-13 ENCOUNTER — Ambulatory Visit: Payer: Self-pay | Admitting: Cardiology

## 2023-09-18 ENCOUNTER — Telehealth: Payer: Self-pay | Admitting: Cardiology

## 2023-09-18 DIAGNOSIS — Z79899 Other long term (current) drug therapy: Secondary | ICD-10-CM

## 2023-09-18 DIAGNOSIS — N2581 Secondary hyperparathyroidism of renal origin: Secondary | ICD-10-CM | POA: Diagnosis not present

## 2023-09-18 DIAGNOSIS — I129 Hypertensive chronic kidney disease with stage 1 through stage 4 chronic kidney disease, or unspecified chronic kidney disease: Secondary | ICD-10-CM | POA: Diagnosis not present

## 2023-09-18 DIAGNOSIS — D631 Anemia in chronic kidney disease: Secondary | ICD-10-CM | POA: Diagnosis not present

## 2023-09-18 DIAGNOSIS — I5032 Chronic diastolic (congestive) heart failure: Secondary | ICD-10-CM

## 2023-09-18 DIAGNOSIS — N184 Chronic kidney disease, stage 4 (severe): Secondary | ICD-10-CM | POA: Diagnosis not present

## 2023-09-18 MED ORDER — LOSARTAN POTASSIUM 25 MG PO TABS: 25.0000 mg | ORAL_TABLET | Freq: Every day | ORAL | 3 refills | Status: DC

## 2023-09-18 NOTE — Telephone Encounter (Signed)
 Since her GFR is less than 30, will hold off on starting Entresto  for now to avoid acute kidney injury. But can start losartan  25 mg p.o. every morning Please follow a low potassium diet BMP and BNP in 1 week  Dr. Michele

## 2023-09-18 NOTE — Telephone Encounter (Signed)
 Spoke with the patient and advised on recommendations from Dr. Michele. Patient is in agreement with plan. Losartan  has been sent in and labs have been ordered.

## 2023-09-18 NOTE — Telephone Encounter (Signed)
 Pt is calling to let Dr. Michele know per Dr. Tobie that she saw Tobie this morning and spoke with him about starting Entresto . Tobie believes pt's kidneys can handle the Entresto . Tobie told pt that once pt has been on Entresto  for two weeks he would like pt to go to [facility] to get kidney levels checked and have labs reviewed by Dr. Michele.

## 2023-09-20 NOTE — Telephone Encounter (Signed)
Labs completed 9/3

## 2023-09-25 DIAGNOSIS — D492 Neoplasm of unspecified behavior of bone, soft tissue, and skin: Secondary | ICD-10-CM | POA: Diagnosis not present

## 2023-09-25 DIAGNOSIS — H0102A Squamous blepharitis right eye, upper and lower eyelids: Secondary | ICD-10-CM | POA: Diagnosis not present

## 2023-09-25 DIAGNOSIS — E119 Type 2 diabetes mellitus without complications: Secondary | ICD-10-CM | POA: Diagnosis not present

## 2023-09-25 DIAGNOSIS — Z961 Presence of intraocular lens: Secondary | ICD-10-CM | POA: Diagnosis not present

## 2023-09-30 DIAGNOSIS — I5032 Chronic diastolic (congestive) heart failure: Secondary | ICD-10-CM | POA: Diagnosis not present

## 2023-10-01 ENCOUNTER — Ambulatory Visit: Payer: Self-pay | Admitting: Cardiology

## 2023-10-01 LAB — BASIC METABOLIC PANEL WITH GFR
BUN/Creatinine Ratio: 12 (ref 12–28)
BUN: 25 mg/dL (ref 8–27)
CO2: 21 mmol/L (ref 20–29)
Calcium: 9.3 mg/dL (ref 8.7–10.3)
Chloride: 106 mmol/L (ref 96–106)
Creatinine, Ser: 2.15 mg/dL — ABNORMAL HIGH (ref 0.57–1.00)
Glucose: 152 mg/dL — ABNORMAL HIGH (ref 70–99)
Potassium: 4.3 mmol/L (ref 3.5–5.2)
Sodium: 140 mmol/L (ref 134–144)
eGFR: 22 mL/min/1.73 — ABNORMAL LOW (ref >59)

## 2023-10-01 LAB — PRO B NATRIURETIC PEPTIDE: NT-Pro BNP: 1889 pg/mL — ABNORMAL HIGH (ref 0–738)

## 2023-10-04 NOTE — Progress Notes (Signed)
 Patient viewed in [facility]  of her lab results   Last read by Ander VEAR Breen at 9:30PM on 10/03/2023.

## 2023-10-15 NOTE — Progress Notes (Signed)
 Remote PPM Transmission

## 2023-10-16 DIAGNOSIS — I130 Hypertensive heart and chronic kidney disease with heart failure and stage 1 through stage 4 chronic kidney disease, or unspecified chronic kidney disease: Secondary | ICD-10-CM | POA: Diagnosis not present

## 2023-10-16 DIAGNOSIS — D641 Secondary sideroblastic anemia due to disease: Secondary | ICD-10-CM | POA: Diagnosis not present

## 2023-10-16 DIAGNOSIS — E039 Hypothyroidism, unspecified: Secondary | ICD-10-CM | POA: Diagnosis not present

## 2023-10-16 DIAGNOSIS — M109 Gout, unspecified: Secondary | ICD-10-CM | POA: Diagnosis not present

## 2023-10-16 DIAGNOSIS — E1129 Type 2 diabetes mellitus with other diabetic kidney complication: Secondary | ICD-10-CM | POA: Diagnosis not present

## 2023-10-23 ENCOUNTER — Ambulatory Visit (INDEPENDENT_AMBULATORY_CARE_PROVIDER_SITE_OTHER)

## 2023-10-23 DIAGNOSIS — I5032 Chronic diastolic (congestive) heart failure: Secondary | ICD-10-CM

## 2023-10-23 DIAGNOSIS — E1122 Type 2 diabetes mellitus with diabetic chronic kidney disease: Secondary | ICD-10-CM | POA: Diagnosis not present

## 2023-10-23 DIAGNOSIS — I130 Hypertensive heart and chronic kidney disease with heart failure and stage 1 through stage 4 chronic kidney disease, or unspecified chronic kidney disease: Secondary | ICD-10-CM | POA: Diagnosis not present

## 2023-10-23 DIAGNOSIS — R82998 Other abnormal findings in urine: Secondary | ICD-10-CM | POA: Diagnosis not present

## 2023-10-23 DIAGNOSIS — Z1339 Encounter for screening examination for other mental health and behavioral disorders: Secondary | ICD-10-CM | POA: Diagnosis not present

## 2023-10-23 DIAGNOSIS — Z1331 Encounter for screening for depression: Secondary | ICD-10-CM | POA: Diagnosis not present

## 2023-10-23 DIAGNOSIS — Z0000 Encounter for general adult medical examination without abnormal findings: Secondary | ICD-10-CM | POA: Diagnosis not present

## 2023-10-23 DIAGNOSIS — Z23 Encounter for immunization: Secondary | ICD-10-CM | POA: Diagnosis not present

## 2023-10-24 ENCOUNTER — Ambulatory Visit: Payer: Self-pay | Admitting: Cardiology

## 2023-10-24 LAB — CUP PACEART REMOTE DEVICE CHECK
Battery Remaining Longevity: 82 mo
Battery Remaining Percentage: 81 %
Battery Voltage: 2.99 V
Brady Statistic AP VP Percent: 7 %
Brady Statistic AP VS Percent: 1 %
Brady Statistic AS VP Percent: 84 %
Brady Statistic AS VS Percent: 8.5 %
Brady Statistic RA Percent Paced: 7.4 %
Brady Statistic RV Percent Paced: 91 %
Date Time Interrogation Session: 20251015040015
Implantable Lead Connection Status: 753985
Implantable Lead Connection Status: 753985
Implantable Lead Implant Date: 20231017
Implantable Lead Implant Date: 20231017
Implantable Lead Location: 753859
Implantable Lead Location: 753860
Implantable Pulse Generator Implant Date: 20231017
Lead Channel Impedance Value: 400 Ohm
Lead Channel Impedance Value: 480 Ohm
Lead Channel Pacing Threshold Amplitude: 1 V
Lead Channel Pacing Threshold Amplitude: 1 V
Lead Channel Pacing Threshold Pulse Width: 0.5 ms
Lead Channel Pacing Threshold Pulse Width: 0.5 ms
Lead Channel Sensing Intrinsic Amplitude: 12 mV
Lead Channel Sensing Intrinsic Amplitude: 2.3 mV
Lead Channel Setting Pacing Amplitude: 2.5 V
Lead Channel Setting Pacing Amplitude: 2.5 V
Lead Channel Setting Pacing Pulse Width: 0.5 ms
Lead Channel Setting Sensing Sensitivity: 2 mV
Pulse Gen Model: 2272
Pulse Gen Serial Number: 8111834

## 2023-10-28 NOTE — Progress Notes (Signed)
 Remote PPM Transmission

## 2023-11-03 ENCOUNTER — Other Ambulatory Visit: Payer: Self-pay | Admitting: Cardiology

## 2023-11-13 DIAGNOSIS — K080 Exfoliation of teeth due to systemic causes: Secondary | ICD-10-CM | POA: Diagnosis not present

## 2023-11-15 ENCOUNTER — Encounter: Payer: Self-pay | Admitting: Podiatry

## 2023-11-15 ENCOUNTER — Ambulatory Visit: Admitting: Podiatry

## 2023-11-15 DIAGNOSIS — B351 Tinea unguium: Secondary | ICD-10-CM | POA: Diagnosis not present

## 2023-11-15 DIAGNOSIS — M79675 Pain in left toe(s): Secondary | ICD-10-CM | POA: Diagnosis not present

## 2023-11-15 DIAGNOSIS — E119 Type 2 diabetes mellitus without complications: Secondary | ICD-10-CM | POA: Diagnosis not present

## 2023-11-15 DIAGNOSIS — M79674 Pain in right toe(s): Secondary | ICD-10-CM

## 2023-11-15 NOTE — Progress Notes (Signed)
This patient returns to my office for at risk foot care.  This patient requires this care by a professional since this patient will be at risk due to having diabetes and chronic kidney disease   This patient is unable to cut nails herself since the patient cannot reach her nails.These nails are painful walking and wearing shoes.  This patient presents for at risk foot care today.  General Appearance  Alert, conversant and in no acute stress.  Vascular  Dorsalis pedis and posterior tibial  pulses are palpable  bilaterally.  Capillary return is within normal limits  bilaterally. Temperature is within normal limits  bilaterally.  Neurologic  Senn-Weinstein monofilament wire test within normal limits  bilaterally. Muscle power within normal limits bilaterally.  Nails Thick disfigured discolored nails with subungual debris  Hallux nails  bilaterally. No evidence of bacterial infection or drainage bilaterally.  Orthopedic  No limitations of motion  feet .  No crepitus or effusions noted.  No bony pathology or digital deformities noted.Mild  HAV  B/L.  Skin  normotropic skin with no porokeratosis noted bilaterally.  No signs of infections or ulcers noted.     Onychomycosis  Pain in right toes  Pain in left toes  Consent was obtained for treatment procedures.   Mechanical debridement of nails 1-5  bilaterally performed with a nail nipper.  Filed with dremel without incident.    Return office visit  10 weeks                   Told patient to return for periodic foot care and evaluation due to potential at risk complications.   Gardiner Barefoot DPM

## 2023-11-26 DIAGNOSIS — D485 Neoplasm of uncertain behavior of skin: Secondary | ICD-10-CM | POA: Diagnosis not present

## 2023-11-26 DIAGNOSIS — H0279 Other degenerative disorders of eyelid and periocular area: Secondary | ICD-10-CM | POA: Diagnosis not present

## 2023-11-26 DIAGNOSIS — H0259 Other disorders affecting eyelid function: Secondary | ICD-10-CM | POA: Diagnosis not present

## 2024-01-15 ENCOUNTER — Telehealth: Payer: Self-pay | Admitting: Cardiology

## 2024-01-15 NOTE — Telephone Encounter (Signed)
 Patient wants a call back directly from Dr. Michele.

## 2024-01-15 NOTE — Telephone Encounter (Signed)
 Patient identification verified by 2 forms.   Called and spoke to patient  Patient states:  -Pacemaker placed by Dr. Cindie. Received a letter he is leaving today.  -She needs to be set up with another EP provider.              Interventions/Plan: -Will get message to EP scheduling  Patient agrees with plan, no questions at this time

## 2024-01-16 ENCOUNTER — Other Ambulatory Visit (HOSPITAL_COMMUNITY): Payer: Self-pay | Admitting: Internal Medicine

## 2024-01-16 DIAGNOSIS — M810 Age-related osteoporosis without current pathological fracture: Secondary | ICD-10-CM | POA: Insufficient documentation

## 2024-01-16 NOTE — Progress Notes (Signed)
 Last Prolia  completed on 06/13/2023. Patient was due in Dec 2025  Calcium  on 09/30/23 wnl  Sherry Pennant, PharmD, MPH, BCPS, CPP Clinical Pharmacist

## 2024-01-17 ENCOUNTER — Telehealth (HOSPITAL_COMMUNITY): Payer: Self-pay | Admitting: Pharmacy Technician

## 2024-01-17 ENCOUNTER — Encounter (HOSPITAL_COMMUNITY): Payer: Self-pay | Admitting: Internal Medicine

## 2024-01-17 LAB — LAB REPORT - SCANNED
Albumin, Urine POC: 133.1
Creatinine, POC: 21.1 mg/dL
Microalb Creat Ratio: 631
PTH: 40
PTH: 80

## 2024-01-17 NOTE — Addendum Note (Signed)
 Addended by: DAYNE SHERRY RAMAN on: 01/17/2024 11:13 AM   Modules accepted: Orders

## 2024-01-17 NOTE — Progress Notes (Signed)
 [employer]ence therapy plan placed (preferred option for insurance)  Sherry Pennant, PharmD, MPH, BCPS, CPP Clinical Pharmacist

## 2024-01-17 NOTE — Telephone Encounter (Signed)
 Auth Submission: NO AUTH NEEDED Site of care: [facility] Payer: BLUE MEDICARE (BCBS MEDICARE) Medication & CPT/J Code(s) submitted: Conexxence (denosumab -bnht) [id number] Diagnosis Code: M85.80 Route of submission (phone, fax, portal): CALLED TO CONFIRM Phone # [phone] Fax # Auth type: Buy/Bill HB Units/visits requested: 60mg  x 2 doses, q 6 months Reference number: [id number]  Approval from: 01/17/2024 to 01/07/25     Dagoberto Armour, CPhT [facility] Phone: [phone] 01/17/2024

## 2024-01-22 ENCOUNTER — Ambulatory Visit

## 2024-01-22 ENCOUNTER — Other Ambulatory Visit: Payer: Self-pay | Admitting: Cardiology

## 2024-01-22 DIAGNOSIS — I5032 Chronic diastolic (congestive) heart failure: Secondary | ICD-10-CM

## 2024-01-22 DIAGNOSIS — I10 Essential (primary) hypertension: Secondary | ICD-10-CM

## 2024-01-23 ENCOUNTER — Ambulatory Visit: Admitting: Podiatry

## 2024-01-23 DIAGNOSIS — B351 Tinea unguium: Secondary | ICD-10-CM

## 2024-01-23 DIAGNOSIS — M79674 Pain in right toe(s): Secondary | ICD-10-CM

## 2024-01-23 DIAGNOSIS — E119 Type 2 diabetes mellitus without complications: Secondary | ICD-10-CM

## 2024-01-23 DIAGNOSIS — M79675 Pain in left toe(s): Secondary | ICD-10-CM

## 2024-01-23 LAB — CUP PACEART REMOTE DEVICE CHECK
Battery Remaining Longevity: 79 mo
Battery Remaining Percentage: 78 %
Battery Voltage: 2.99 V
Brady Statistic AP VP Percent: 7.5 %
Brady Statistic AP VS Percent: 1 %
Brady Statistic AS VP Percent: 85 %
Brady Statistic AS VS Percent: 7.1 %
Brady Statistic RA Percent Paced: 7.8 %
Brady Statistic RV Percent Paced: 92 %
Date Time Interrogation Session: 20260114181138
Implantable Lead Connection Status: 753985
Implantable Lead Connection Status: 753985
Implantable Lead Implant Date: 20231017
Implantable Lead Implant Date: 20231017
Implantable Lead Location: 753859
Implantable Lead Location: 753860
Implantable Pulse Generator Implant Date: 20231017
Lead Channel Impedance Value: 400 Ohm
Lead Channel Impedance Value: 460 Ohm
Lead Channel Pacing Threshold Amplitude: 1 V
Lead Channel Pacing Threshold Amplitude: 1 V
Lead Channel Pacing Threshold Pulse Width: 0.5 ms
Lead Channel Pacing Threshold Pulse Width: 0.5 ms
Lead Channel Sensing Intrinsic Amplitude: 12 mV
Lead Channel Sensing Intrinsic Amplitude: 4.2 mV
Lead Channel Setting Pacing Amplitude: 2.5 V
Lead Channel Setting Pacing Amplitude: 2.5 V
Lead Channel Setting Pacing Pulse Width: 0.5 ms
Lead Channel Setting Sensing Sensitivity: 2 mV
Pulse Gen Model: 2272
Pulse Gen Serial Number: 8111834

## 2024-01-23 NOTE — Telephone Encounter (Signed)
 In accordance with refill protocols, please review and address the following requirements before this medication refill can be authorized:  Labs

## 2024-01-23 NOTE — Progress Notes (Signed)
 This patient returns to my office for at risk foot care.  This patient requires this care by a professional since this patient will be at risk due to having diabetes and chronic kidney disease  Patient says she is stage 4 CKD.  This patient is unable to cut nails herself since the patient cannot reach her nails.These nails are painful walking and wearing shoes.  This patient presents for at risk foot care today.  General Appearance  Alert, conversant and in no acute stress.  Vascular  Dorsalis pedis and posterior tibial  pulses are palpable  bilaterally.  Capillary return is within normal limits  bilaterally. Temperature is within normal limits  bilaterally.  Neurologic  Senn-Weinstein monofilament wire test within normal limits  bilaterally. Muscle power within normal limits bilaterally.  Nails Thick disfigured discolored nails with subungual debris  Hallux nails  bilaterally. No evidence of bacterial infection or drainage bilaterally.  Orthopedic  No limitations of motion  feet .  No crepitus or effusions noted.  No bony pathology or digital deformities noted.  Mild  HAV  B/L.  Skin  normotropic skin with no porokeratosis noted bilaterally.  No signs of infections or ulcers noted.     Onychomycosis  Pain in right toes  Pain in left toes  Consent was obtained for treatment procedures.   Mechanical debridement of nails 1-5  bilaterally performed with a nail nipper.  Filed with dremel without incident.    Return office visit  10 weeks                   Told patient to return for periodic foot care and evaluation due to potential at risk complications.   Cordella Bold DPM lazarus

## 2024-01-23 NOTE — Telephone Encounter (Signed)
 Please refill.  Ask if she has had recent labs - if so where were they done?  Dr. Mariyah Upshaw

## 2024-01-24 ENCOUNTER — Ambulatory Visit: Payer: Self-pay | Admitting: Cardiology

## 2024-01-24 ENCOUNTER — Telehealth: Payer: Self-pay | Admitting: Cardiology

## 2024-01-24 NOTE — Telephone Encounter (Signed)
 Patient says she is returning a missed call from yesterday but she didn't receive a voicemail.

## 2024-01-27 ENCOUNTER — Ambulatory Visit (HOSPITAL_COMMUNITY)
Admission: RE | Admit: 2024-01-27 | Discharge: 2024-01-27 | Disposition: A | Source: Ambulatory Visit | Attending: Internal Medicine

## 2024-01-27 VITALS — BP 140/58 | HR 61 | Temp 97.8°F | Resp 16

## 2024-01-27 DIAGNOSIS — E559 Vitamin D deficiency, unspecified: Secondary | ICD-10-CM | POA: Insufficient documentation

## 2024-01-27 DIAGNOSIS — M810 Age-related osteoporosis without current pathological fracture: Secondary | ICD-10-CM | POA: Insufficient documentation

## 2024-01-27 MED ORDER — DENOSUMAB-BNHT 60 MG/ML ~~LOC~~ SOSY
60.0000 mg | PREFILLED_SYRINGE | Freq: Once | SUBCUTANEOUS | Status: AC
  Administered 2024-01-27: 60 mg via SUBCUTANEOUS

## 2024-01-27 MED ORDER — DENOSUMAB-BNHT 60 MG/ML ~~LOC~~ SOSY
PREFILLED_SYRINGE | SUBCUTANEOUS | Status: AC
  Filled 2024-01-27: qty 1

## 2024-01-27 NOTE — Progress Notes (Signed)
 Remote PPM Transmission

## 2024-01-28 MED ORDER — EMPAGLIFLOZIN 10 MG PO TABS: 10.0000 mg | ORAL_TABLET | Freq: Every day | ORAL | 0 refills | Status: AC

## 2024-01-28 MED ORDER — ISOSORBIDE MONONITRATE ER 30 MG PO TB24: 15.0000 mg | ORAL_TABLET | Freq: Every day | ORAL | 0 refills | Status: AC

## 2024-01-28 MED ORDER — BUMETANIDE 0.5 MG PO TABS: 0.5000 mg | ORAL_TABLET | Freq: Every day | ORAL | 0 refills | Status: AC

## 2024-01-28 MED ORDER — METOPROLOL SUCCINATE ER 50 MG PO TB24: 50.0000 mg | ORAL_TABLET | Freq: Every day | ORAL | 0 refills | Status: AC

## 2024-01-28 MED ORDER — LOSARTAN POTASSIUM 25 MG PO TABS: 25.0000 mg | ORAL_TABLET | Freq: Every day | ORAL | 0 refills | Status: AC

## 2024-02-02 ENCOUNTER — Ambulatory Visit: Payer: Self-pay | Admitting: Cardiology

## 2024-04-02 ENCOUNTER — Ambulatory Visit: Admitting: Podiatry

## 2024-04-22 ENCOUNTER — Encounter

## 2024-07-22 ENCOUNTER — Encounter

## 2024-07-27 ENCOUNTER — Encounter (HOSPITAL_COMMUNITY)

## 2024-08-13 ENCOUNTER — Other Ambulatory Visit (HOSPITAL_COMMUNITY)
